# Patient Record
Sex: Male | Born: 1940 | Race: White | Hispanic: No | Marital: Married | Smoking: Current every day smoker
Health system: Southern US, Community
[De-identification: ages and names within clinical notes are randomized; demographics above are authoritative.]

## PROBLEM LIST (undated history)

## (undated) DIAGNOSIS — R262 Difficulty in walking, not elsewhere classified: Secondary | ICD-10-CM

## (undated) DIAGNOSIS — R634 Abnormal weight loss: Secondary | ICD-10-CM

## (undated) DIAGNOSIS — Z86010 Personal history of colonic polyps: Secondary | ICD-10-CM

## (undated) DIAGNOSIS — I509 Heart failure, unspecified: Secondary | ICD-10-CM

## (undated) DIAGNOSIS — K219 Gastro-esophageal reflux disease without esophagitis: Secondary | ICD-10-CM

## (undated) DIAGNOSIS — I10 Essential (primary) hypertension: Secondary | ICD-10-CM

## (undated) DIAGNOSIS — I350 Nonrheumatic aortic (valve) stenosis: Secondary | ICD-10-CM

## (undated) DIAGNOSIS — E785 Hyperlipidemia, unspecified: Secondary | ICD-10-CM

## (undated) DIAGNOSIS — E119 Type 2 diabetes mellitus without complications: Secondary | ICD-10-CM

## (undated) DIAGNOSIS — R000 Tachycardia, unspecified: Secondary | ICD-10-CM

## (undated) DIAGNOSIS — E8770 Fluid overload, unspecified: Secondary | ICD-10-CM

## (undated) DIAGNOSIS — C3491 Malignant neoplasm of unspecified part of right bronchus or lung: Secondary | ICD-10-CM

## (undated) DIAGNOSIS — J189 Pneumonia, unspecified organism: Secondary | ICD-10-CM

## (undated) DIAGNOSIS — R1031 Right lower quadrant pain: Secondary | ICD-10-CM

## (undated) DIAGNOSIS — Z720 Tobacco use: Secondary | ICD-10-CM

## (undated) HISTORY — DX: Heart failure, unspecified: I50.9

## (undated) HISTORY — PX: DOPPLER ECHOCARDIOGRAPHY: SHX263

## (undated) HISTORY — DX: Type 2 diabetes mellitus without complications: E11.9

## (undated) HISTORY — DX: Difficulty in walking, not elsewhere classified: R26.2

## (undated) HISTORY — DX: Fluid overload, unspecified: E87.70

## (undated) HISTORY — DX: Abnormal weight loss: R63.4

## (undated) HISTORY — DX: Right lower quadrant pain: R10.31

## (undated) HISTORY — DX: Essential (primary) hypertension: I10

## (undated) HISTORY — DX: Tobacco use: Z72.0

## (undated) HISTORY — DX: Tachycardia, unspecified: R00.0

## (undated) HISTORY — DX: Pneumonia, unspecified organism: J18.9

## (undated) HISTORY — DX: Gastro-esophageal reflux disease without esophagitis: K21.9

## (undated) HISTORY — DX: Personal history of colonic polyps: Z86.010

---

## 2008-08-08 ENCOUNTER — Encounter: Admission: RE | Admit: 2008-08-08 | Discharge: 2008-08-08 | Payer: Self-pay | Admitting: Family Medicine

## 2008-08-08 ENCOUNTER — Encounter: Payer: Self-pay | Admitting: Internal Medicine

## 2008-09-22 DIAGNOSIS — E119 Type 2 diabetes mellitus without complications: Secondary | ICD-10-CM

## 2008-09-29 ENCOUNTER — Ambulatory Visit: Payer: Self-pay | Admitting: Internal Medicine

## 2008-09-29 DIAGNOSIS — R1031 Right lower quadrant pain: Secondary | ICD-10-CM

## 2008-09-29 DIAGNOSIS — R634 Abnormal weight loss: Secondary | ICD-10-CM

## 2008-09-29 HISTORY — DX: Right lower quadrant pain: R10.31

## 2008-09-29 HISTORY — DX: Abnormal weight loss: R63.4

## 2008-10-16 ENCOUNTER — Telehealth: Payer: Self-pay | Admitting: Internal Medicine

## 2008-10-20 ENCOUNTER — Telehealth: Payer: Self-pay | Admitting: Internal Medicine

## 2008-10-20 ENCOUNTER — Ambulatory Visit: Payer: Self-pay | Admitting: Internal Medicine

## 2008-10-20 ENCOUNTER — Encounter: Payer: Self-pay | Admitting: Internal Medicine

## 2008-10-20 LAB — CONVERTED CEMR LAB: UREASE: NEGATIVE

## 2008-10-22 ENCOUNTER — Encounter: Payer: Self-pay | Admitting: Internal Medicine

## 2008-11-20 ENCOUNTER — Ambulatory Visit (HOSPITAL_COMMUNITY): Admission: RE | Admit: 2008-11-20 | Discharge: 2008-11-20 | Payer: Self-pay | Admitting: Family Medicine

## 2008-12-09 ENCOUNTER — Ambulatory Visit: Payer: Self-pay | Admitting: Oncology

## 2008-12-18 LAB — CBC WITH DIFFERENTIAL/PLATELET
BASO%: 0.4 % (ref 0.0–2.0)
EOS%: 0.7 % (ref 0.0–7.0)
LYMPH%: 20.2 % (ref 14.0–49.0)
MCH: 32.7 pg (ref 27.2–33.4)
MCHC: 34.7 g/dL (ref 32.0–36.0)
MONO#: 1.1 10*3/uL — ABNORMAL HIGH (ref 0.1–0.9)
MONO%: 9.4 % (ref 0.0–14.0)
Platelets: 306 10*3/uL (ref 140–400)
RBC: 5.18 10*6/uL (ref 4.20–5.82)
WBC: 11.2 10*3/uL — ABNORMAL HIGH (ref 4.0–10.3)

## 2008-12-18 LAB — MORPHOLOGY
PLT EST: ADEQUATE
RBC Comments: NORMAL

## 2008-12-23 ENCOUNTER — Encounter: Payer: Self-pay | Admitting: Oncology

## 2008-12-23 ENCOUNTER — Other Ambulatory Visit: Admission: RE | Admit: 2008-12-23 | Discharge: 2008-12-23 | Payer: Self-pay | Admitting: Oncology

## 2008-12-23 LAB — CBC WITH DIFFERENTIAL/PLATELET
BASO%: 1.2 % (ref 0.0–2.0)
Eosinophils Absolute: 0.1 10*3/uL (ref 0.0–0.5)
MCHC: 34.8 g/dL (ref 32.0–36.0)
MONO#: 0.4 10*3/uL (ref 0.1–0.9)
MONO%: 4.4 % (ref 0.0–14.0)
NEUT#: 6.5 10*3/uL (ref 1.5–6.5)
RBC: 4.86 10*6/uL (ref 4.20–5.82)
RDW: 12.9 % (ref 11.0–14.6)
WBC: 8.7 10*3/uL (ref 4.0–10.3)

## 2008-12-23 LAB — CHCC SMEAR

## 2008-12-24 LAB — COMPREHENSIVE METABOLIC PANEL
Albumin: 4.3 g/dL (ref 3.5–5.2)
BUN: 8 mg/dL (ref 6–23)
CO2: 29 mEq/L (ref 19–32)
Calcium: 9.9 mg/dL (ref 8.4–10.5)
Chloride: 99 mEq/L (ref 96–112)
Glucose, Bld: 155 mg/dL — ABNORMAL HIGH (ref 70–99)
Potassium: 4.2 mEq/L (ref 3.5–5.3)
Sodium: 138 mEq/L (ref 135–145)
Total Protein: 6.9 g/dL (ref 6.0–8.3)

## 2008-12-25 ENCOUNTER — Encounter: Admission: RE | Admit: 2008-12-25 | Discharge: 2008-12-25 | Payer: Self-pay | Admitting: Oncology

## 2008-12-25 ENCOUNTER — Encounter (INDEPENDENT_AMBULATORY_CARE_PROVIDER_SITE_OTHER): Payer: Self-pay | Admitting: *Deleted

## 2008-12-25 LAB — SPEP & IFE WITH QIG
Alpha-2-Globulin: 13.1 % — ABNORMAL HIGH (ref 7.1–11.8)
IgG (Immunoglobin G), Serum: 630 mg/dL — ABNORMAL LOW (ref 694–1618)
Total Protein, Serum Electrophoresis: 6.8 g/dL (ref 6.0–8.3)

## 2008-12-25 LAB — KAPPA/LAMBDA LIGHT CHAINS: Kappa free light chain: <0.6 mg/dL (ref 0.33–1.94)

## 2009-02-12 ENCOUNTER — Encounter: Payer: Self-pay | Admitting: *Deleted

## 2009-04-01 ENCOUNTER — Ambulatory Visit: Payer: Self-pay | Admitting: Oncology

## 2009-06-04 ENCOUNTER — Ambulatory Visit: Payer: Self-pay | Admitting: Internal Medicine

## 2009-06-04 DIAGNOSIS — K219 Gastro-esophageal reflux disease without esophagitis: Secondary | ICD-10-CM

## 2009-06-04 DIAGNOSIS — Z860100 Personal history of colon polyps, unspecified: Secondary | ICD-10-CM | POA: Insufficient documentation

## 2009-06-04 HISTORY — DX: Gastro-esophageal reflux disease without esophagitis: K21.9

## 2009-06-04 HISTORY — DX: Personal history of colonic polyps: Z86.010

## 2010-10-24 ENCOUNTER — Encounter: Payer: Self-pay | Admitting: Family Medicine

## 2010-10-25 ENCOUNTER — Encounter: Payer: Self-pay | Admitting: Oncology

## 2010-10-25 ENCOUNTER — Encounter: Payer: Self-pay | Admitting: Internal Medicine

## 2011-01-13 LAB — BONE MARROW EXAM

## 2011-07-13 ENCOUNTER — Other Ambulatory Visit: Payer: Self-pay | Admitting: Family Medicine

## 2011-10-17 ENCOUNTER — Encounter: Payer: Self-pay | Admitting: Internal Medicine

## 2012-03-03 ENCOUNTER — Encounter: Payer: Self-pay | Admitting: *Deleted

## 2012-07-13 ENCOUNTER — Encounter: Payer: Self-pay | Admitting: Internal Medicine

## 2014-05-24 ENCOUNTER — Encounter: Payer: Self-pay | Admitting: Internal Medicine

## 2015-11-10 DIAGNOSIS — F17200 Nicotine dependence, unspecified, uncomplicated: Secondary | ICD-10-CM | POA: Insufficient documentation

## 2015-11-10 DIAGNOSIS — G8929 Other chronic pain: Secondary | ICD-10-CM | POA: Insufficient documentation

## 2015-11-10 DIAGNOSIS — I10 Essential (primary) hypertension: Secondary | ICD-10-CM | POA: Insufficient documentation

## 2016-08-03 HISTORY — PX: CATARACT EXTRACTION, BILATERAL: SHX1313

## 2018-09-12 ENCOUNTER — Other Ambulatory Visit: Payer: Self-pay | Admitting: Physician Assistant

## 2018-09-12 ENCOUNTER — Ambulatory Visit
Admission: RE | Admit: 2018-09-12 | Discharge: 2018-09-12 | Disposition: A | Discharge status: Home / Self Care | Payer: Medicare Other | Source: Ambulatory Visit | Attending: Physician Assistant | Admitting: Physician Assistant

## 2018-09-12 DIAGNOSIS — R0602 Shortness of breath: Secondary | ICD-10-CM

## 2018-09-13 ENCOUNTER — Other Ambulatory Visit: Payer: Self-pay

## 2018-09-13 ENCOUNTER — Encounter (HOSPITAL_COMMUNITY): Payer: Self-pay | Admitting: *Deleted

## 2018-09-13 ENCOUNTER — Inpatient Hospital Stay (HOSPITAL_COMMUNITY)
Admission: EM | Admit: 2018-09-13 | Discharge: 2018-09-15 | DRG: 291 | Disposition: A | Discharge status: Home / Self Care | Payer: Medicare Other | Attending: Internal Medicine | Admitting: Internal Medicine

## 2018-09-13 ENCOUNTER — Emergency Department (HOSPITAL_COMMUNITY): Payer: Medicare Other

## 2018-09-13 DIAGNOSIS — K219 Gastro-esophageal reflux disease without esophagitis: Secondary | ICD-10-CM | POA: Diagnosis present

## 2018-09-13 DIAGNOSIS — I5023 Acute on chronic systolic (congestive) heart failure: Secondary | ICD-10-CM | POA: Diagnosis present

## 2018-09-13 DIAGNOSIS — F17210 Nicotine dependence, cigarettes, uncomplicated: Secondary | ICD-10-CM | POA: Diagnosis present

## 2018-09-13 DIAGNOSIS — E8770 Fluid overload, unspecified: Secondary | ICD-10-CM | POA: Diagnosis present

## 2018-09-13 DIAGNOSIS — Z720 Tobacco use: Secondary | ICD-10-CM | POA: Diagnosis present

## 2018-09-13 DIAGNOSIS — E785 Hyperlipidemia, unspecified: Secondary | ICD-10-CM | POA: Diagnosis present

## 2018-09-13 DIAGNOSIS — I110 Hypertensive heart disease with heart failure: Secondary | ICD-10-CM | POA: Diagnosis present

## 2018-09-13 DIAGNOSIS — E119 Type 2 diabetes mellitus without complications: Secondary | ICD-10-CM

## 2018-09-13 DIAGNOSIS — Z7982 Long term (current) use of aspirin: Secondary | ICD-10-CM

## 2018-09-13 DIAGNOSIS — J189 Pneumonia, unspecified organism: Secondary | ICD-10-CM | POA: Diagnosis present

## 2018-09-13 DIAGNOSIS — E1165 Type 2 diabetes mellitus with hyperglycemia: Secondary | ICD-10-CM | POA: Diagnosis present

## 2018-09-13 DIAGNOSIS — I509 Heart failure, unspecified: Secondary | ICD-10-CM

## 2018-09-13 DIAGNOSIS — I340 Nonrheumatic mitral (valve) insufficiency: Secondary | ICD-10-CM | POA: Diagnosis not present

## 2018-09-13 DIAGNOSIS — I350 Nonrheumatic aortic (valve) stenosis: Secondary | ICD-10-CM | POA: Diagnosis present

## 2018-09-13 DIAGNOSIS — Z7984 Long term (current) use of oral hypoglycemic drugs: Secondary | ICD-10-CM | POA: Diagnosis not present

## 2018-09-13 DIAGNOSIS — Z79899 Other long term (current) drug therapy: Secondary | ICD-10-CM | POA: Diagnosis not present

## 2018-09-13 HISTORY — DX: Pneumonia, unspecified organism: J18.9

## 2018-09-13 HISTORY — DX: Fluid overload, unspecified: E87.70

## 2018-09-13 HISTORY — DX: Nonrheumatic aortic (valve) stenosis: I35.0

## 2018-09-13 HISTORY — DX: Tobacco use: Z72.0

## 2018-09-13 HISTORY — DX: Hyperlipidemia, unspecified: E78.5

## 2018-09-13 LAB — MAGNESIUM: Magnesium: 1.7 mg/dL (ref 1.7–2.4)

## 2018-09-13 LAB — CBC WITH DIFFERENTIAL/PLATELET
Abs Immature Granulocytes: 0.04 10*3/uL (ref 0.00–0.07)
BASOS ABS: 0.1 10*3/uL (ref 0.0–0.1)
Basophils Relative: 1 %
Eosinophils Absolute: 0.1 10*3/uL (ref 0.0–0.5)
Eosinophils Relative: 1 %
HCT: 46.7 % (ref 39.0–52.0)
Hemoglobin: 15 g/dL (ref 13.0–17.0)
Immature Granulocytes: 1 %
Lymphocytes Relative: 11 %
Lymphs Abs: 0.9 10*3/uL (ref 0.7–4.0)
MCH: 30.5 pg (ref 26.0–34.0)
MCHC: 32.1 g/dL (ref 30.0–36.0)
MCV: 95.1 fL (ref 80.0–100.0)
MONOS PCT: 8 %
Monocytes Absolute: 0.7 10*3/uL (ref 0.1–1.0)
Neutro Abs: 6.7 10*3/uL (ref 1.7–7.7)
Neutrophils Relative %: 78 %
Platelets: 278 10*3/uL (ref 150–400)
RBC: 4.91 MIL/uL (ref 4.22–5.81)
RDW: 13.6 % (ref 11.5–15.5)
WBC: 8.6 10*3/uL (ref 4.0–10.5)
nRBC: 0 % (ref 0.0–0.2)

## 2018-09-13 LAB — BASIC METABOLIC PANEL
Anion gap: 16 — ABNORMAL HIGH (ref 5–15)
BUN: 9 mg/dL (ref 8–23)
CO2: 23 mmol/L (ref 22–32)
Calcium: 9.1 mg/dL (ref 8.9–10.3)
Chloride: 98 mmol/L (ref 98–111)
Creatinine, Ser: 0.96 mg/dL (ref 0.61–1.24)
GFR calc Af Amer: >60 mL/min (ref >60)
GFR calc non Af Amer: >60 mL/min (ref >60)
GLUCOSE: 201 mg/dL — AB (ref 70–99)
Potassium: 4.1 mmol/L (ref 3.5–5.1)
Sodium: 137 mmol/L (ref 135–145)

## 2018-09-13 LAB — PROTIME-INR
INR: 1.06
Prothrombin Time: 13.7 seconds (ref 11.4–15.2)

## 2018-09-13 LAB — GLUCOSE, CAPILLARY: Glucose-Capillary: 145 mg/dL — ABNORMAL HIGH (ref 70–99)

## 2018-09-13 LAB — I-STAT TROPONIN, ED: Troponin i, poc: 0.03 ng/mL (ref 0.00–0.08)

## 2018-09-13 LAB — BRAIN NATRIURETIC PEPTIDE: B NATRIURETIC PEPTIDE 5: 927.4 pg/mL — AB (ref 0.0–100.0)

## 2018-09-13 LAB — PHOSPHORUS: Phosphorus: 3.7 mg/dL (ref 2.5–4.6)

## 2018-09-13 LAB — I-STAT CG4 LACTIC ACID, ED
Lactic Acid, Venous: 1.76 mmol/L (ref 0.5–1.9)
Lactic Acid, Venous: 1.42 mmol/L (ref 0.5–1.9)

## 2018-09-13 MED ORDER — INSULIN ASPART 100 UNIT/ML ~~LOC~~ SOLN
0.0000 [IU] | Freq: Three times a day (TID) | SUBCUTANEOUS | Status: DC
  Administered 2018-09-14: 1 [IU] via SUBCUTANEOUS
  Administered 2018-09-14: 2 [IU] via SUBCUTANEOUS
  Administered 2018-09-15: 1 [IU] via SUBCUTANEOUS

## 2018-09-13 MED ORDER — AZITHROMYCIN 500 MG PO TABS
500.0000 mg | ORAL_TABLET | ORAL | Status: DC
  Administered 2018-09-14: 500 mg via ORAL
  Filled 2018-09-13: qty 1

## 2018-09-13 MED ORDER — SODIUM CHLORIDE 0.9 % IV SOLN
500.0000 mg | Freq: Once | INTRAVENOUS | Status: AC
  Administered 2018-09-13: 500 mg via INTRAVENOUS
  Filled 2018-09-13: qty 500

## 2018-09-13 MED ORDER — ALBUTEROL SULFATE (2.5 MG/3ML) 0.083% IN NEBU: 2.5000 mg | INHALATION_SOLUTION | RESPIRATORY_TRACT | Status: DC | PRN

## 2018-09-13 MED ORDER — FUROSEMIDE 10 MG/ML IJ SOLN
40.0000 mg | Freq: Once | INTRAMUSCULAR | Status: AC
  Administered 2018-09-13: 40 mg via INTRAVENOUS
  Filled 2018-09-13: qty 4

## 2018-09-13 MED ORDER — NICOTINE 21 MG/24HR TD PT24
21.0000 mg | MEDICATED_PATCH | TRANSDERMAL | Status: DC
  Administered 2018-09-13: 21 mg via TRANSDERMAL
  Filled 2018-09-13 (×2): qty 1

## 2018-09-13 MED ORDER — PRAVASTATIN SODIUM 40 MG PO TABS
40.0000 mg | ORAL_TABLET | Freq: Every day | ORAL | Status: DC
  Administered 2018-09-14 – 2018-09-15 (×2): 40 mg via ORAL
  Filled 2018-09-13 (×2): qty 1

## 2018-09-13 MED ORDER — PROCHLORPERAZINE EDISYLATE 10 MG/2ML IJ SOLN
5.0000 mg | INTRAMUSCULAR | Status: DC | PRN
  Filled 2018-09-13: qty 1

## 2018-09-13 MED ORDER — SODIUM CHLORIDE 0.9 % IV SOLN
1.0000 g | INTRAVENOUS | Status: DC
  Administered 2018-09-14: 1 g via INTRAVENOUS
  Filled 2018-09-13: qty 10

## 2018-09-13 MED ORDER — MAGNESIUM SULFATE 2 GM/50ML IV SOLN
2.0000 g | Freq: Once | INTRAVENOUS | Status: AC
  Administered 2018-09-13: 2 g via INTRAVENOUS
  Filled 2018-09-13: qty 50

## 2018-09-13 MED ORDER — HEPARIN SODIUM (PORCINE) 5000 UNIT/ML IJ SOLN
5000.0000 [IU] | Freq: Three times a day (TID) | INTRAMUSCULAR | Status: DC
  Administered 2018-09-13 – 2018-09-15 (×5): 5000 [IU] via SUBCUTANEOUS
  Filled 2018-09-13 (×6): qty 1

## 2018-09-13 MED ORDER — IPRATROPIUM-ALBUTEROL 0.5-2.5 (3) MG/3ML IN SOLN
3.0000 mL | Freq: Once | RESPIRATORY_TRACT | Status: AC
  Administered 2018-09-13: 3 mL via RESPIRATORY_TRACT
  Filled 2018-09-13: qty 3

## 2018-09-13 MED ORDER — IPRATROPIUM BROMIDE 0.02 % IN SOLN
0.5000 mg | Freq: Four times a day (QID) | RESPIRATORY_TRACT | Status: DC
  Administered 2018-09-13: 0.5 mg via RESPIRATORY_TRACT
  Filled 2018-09-13 (×2): qty 2.5

## 2018-09-13 MED ORDER — ACETAMINOPHEN 325 MG PO TABS
650.0000 mg | ORAL_TABLET | Freq: Four times a day (QID) | ORAL | Status: DC | PRN
  Administered 2018-09-14: 650 mg via ORAL
  Filled 2018-09-13: qty 2

## 2018-09-13 MED ORDER — ASPIRIN EC 81 MG PO TBEC
81.0000 mg | DELAYED_RELEASE_TABLET | Freq: Every day | ORAL | Status: DC
  Administered 2018-09-14 – 2018-09-15 (×2): 81 mg via ORAL
  Filled 2018-09-13 (×2): qty 1

## 2018-09-13 MED ORDER — LISINOPRIL 5 MG PO TABS
2.5000 mg | ORAL_TABLET | Freq: Every day | ORAL | Status: DC
  Administered 2018-09-13 – 2018-09-15 (×3): 2.5 mg via ORAL
  Filled 2018-09-13 (×3): qty 1

## 2018-09-13 MED ORDER — SODIUM CHLORIDE 0.9 % IV SOLN
1.0000 g | Freq: Once | INTRAVENOUS | Status: AC
  Administered 2018-09-13: 1 g via INTRAVENOUS
  Filled 2018-09-13: qty 10

## 2018-09-13 MED ORDER — POTASSIUM CHLORIDE CRYS ER 20 MEQ PO TBCR
20.0000 meq | EXTENDED_RELEASE_TABLET | Freq: Two times a day (BID) | ORAL | Status: DC
  Administered 2018-09-13 – 2018-09-15 (×4): 20 meq via ORAL
  Filled 2018-09-13 (×5): qty 1

## 2018-09-13 MED ORDER — ONDANSETRON HCL 4 MG/2ML IJ SOLN: 4.0000 mg | Freq: Four times a day (QID) | INTRAMUSCULAR | Status: DC | PRN

## 2018-09-13 MED ORDER — ONDANSETRON HCL 4 MG PO TABS: 4.0000 mg | ORAL_TABLET | Freq: Four times a day (QID) | ORAL | Status: DC | PRN

## 2018-09-13 MED ORDER — ACETAMINOPHEN 650 MG RE SUPP: 650.0000 mg | Freq: Four times a day (QID) | RECTAL | Status: DC | PRN

## 2018-09-13 MED ORDER — FUROSEMIDE 10 MG/ML IJ SOLN
40.0000 mg | Freq: Every day | INTRAMUSCULAR | Status: DC
  Administered 2018-09-14 – 2018-09-15 (×2): 40 mg via INTRAVENOUS
  Filled 2018-09-13 (×2): qty 4

## 2018-09-13 MED ORDER — PANTOPRAZOLE SODIUM 40 MG PO TBEC
40.0000 mg | DELAYED_RELEASE_TABLET | Freq: Every day | ORAL | Status: DC
  Administered 2018-09-13 – 2018-09-15 (×3): 40 mg via ORAL
  Filled 2018-09-13 (×3): qty 1

## 2018-09-13 MED ORDER — GLIMEPIRIDE 4 MG PO TABS
4.0000 mg | ORAL_TABLET | Freq: Every day | ORAL | Status: DC
  Administered 2018-09-14: 4 mg via ORAL
  Filled 2018-09-13 (×2): qty 1

## 2018-09-13 MED ORDER — METOPROLOL TARTRATE 25 MG PO TABS
25.0000 mg | ORAL_TABLET | Freq: Two times a day (BID) | ORAL | Status: DC
  Administered 2018-09-13 – 2018-09-15 (×4): 25 mg via ORAL
  Filled 2018-09-13 (×4): qty 1

## 2018-09-13 MED ORDER — ALBUTEROL SULFATE (2.5 MG/3ML) 0.083% IN NEBU
2.5000 mg | INHALATION_SOLUTION | Freq: Four times a day (QID) | RESPIRATORY_TRACT | Status: DC
  Administered 2018-09-13: 2.5 mg via RESPIRATORY_TRACT
  Filled 2018-09-13 (×2): qty 3

## 2018-09-13 MED ORDER — METFORMIN HCL 500 MG PO TABS
1000.0000 mg | ORAL_TABLET | Freq: Two times a day (BID) | ORAL | Status: DC
  Administered 2018-09-14 – 2018-09-15 (×3): 1000 mg via ORAL
  Filled 2018-09-13 (×3): qty 2

## 2018-09-13 NOTE — ED Provider Notes (Signed)
[place] [facility] EMERGENCY DEPARTMENT Provider Note   CSN: [id number] Arrival date & time: 09/13/18  1108     History   Chief Complaint Chief Complaint  Patient presents with  . CHF/Possible pneumonia    HPI Geoffrey Benjamin is a 77 y.o. male.  HPI Patient reports he has been becoming increasingly short of breath for about 3 weeks.  This has been worsening over the past week.  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  He had the rest frequently to catch his breath.  Patient denies he was having chest pain in association with this.  He reports as well at night he is having trouble sleeping because when he lies flat he feels very short of breath.  Again, no chest pain associated.  No fever and no cough.  Patient reports that he saw his doctor yesterday and they did some labs and x-rays.  He reports they called him and were very concerned that he might have pneumonia or fluid on the lungs and advised him to come to the emergency department.  Patient denies he is having any lower extremity swelling or calf pain.  He does smoke heavily still.  He denies he knows of any diagnosis of COPD. Past Medical History:  Diagnosis Date  . DM (diabetes mellitus) ([place])   . Tachycardia     Patient Active Problem List   Diagnosis Date Noted  . GERD 06/04/2009  . PERSONAL HX COLONIC POLYPS 06/04/2009  . LOSS OF WEIGHT 09/29/2008  . RLQ PAIN 09/29/2008  . DM 09/22/2008    Past Surgical History:  Procedure Laterality Date  . DOPPLER ECHOCARDIOGRAPHY     Mitral inflow and tissue doppler consistent with impaired LV relaxation; Trileaflet aortic vlave with moderate aortic valve stenosis, Trivial mitral and tricuspid valve regurgitation.        Home Medications    Prior to Admission medications   Medication Sig Start Date End Date Taking? Authorizing Provider  aspirin EC 81 MG tablet Take 81 mg by mouth daily.   Yes [provider]  glimepiride  (AMARYL) 4 MG tablet Take 4 mg by mouth daily at 12 noon. 05/18/18  Yes [provider]  metFORMIN (GLUCOPHAGE) 1000 MG tablet Take 1,000 mg by mouth 2 (two) times daily with a meal. 09/07/18  Yes [provider]  metoprolol tartrate (LOPRESSOR) 25 MG tablet TAKE ONE TABLET BY MOUTH TWICE DAILY Patient taking differently: Take 25 mg by mouth 2 (two) times daily.  07/13/11  Yes Burchette, Alinda Sierras, MD  Omega-3 Fatty Acids (FISH OIL) 1200 MG CAPS Take 1,200 mg by mouth daily.   Yes [provider]  pravastatin (PRAVACHOL) 40 MG tablet Take 40 mg by mouth daily. 09/07/18  Yes [provider]    Family History Family History  Problem Relation Age of Onset  . Kidney disease Father   . Stroke Mother     Social History Social History   Tobacco Use  . Smoking status: Smoker, Current Status Unknown    Types: Cigarettes  . Smokeless tobacco: Never Used  Substance Use Topics  . Alcohol use: Yes  . Drug use: Not on file     Allergies   Patient has no known allergies.   Review of Systems Review of Systems 10 Systems reviewed and are negative for acute change except as noted in the HPI.  Physical Exam Updated Vital Signs BP 127/75   Pulse 79  Temp (!) 97.4 F (36.3 C) (Oral)   Resp (!) 22   Ht 6' (1.829 m)   Wt 82.6 kg   SpO2 94%   BMI 24.68 kg/m   Physical Exam Constitutional:      Comments: Patient is alert and nontoxic.  No respiratory distress at rest.  Normal mental status.  HENT:     Head: Normocephalic and atraumatic.     Mouth/Throat:     Mouth: Mucous membranes are moist.     Pharynx: Oropharynx is clear.  Eyes:     Extraocular Movements: Extraocular movements intact.  Cardiovascular:     Rate and Rhythm: Normal rate.     Comments: 3+ systolic ejection murmur. Pulmonary:     Comments: No respiratory distress at rest.  Lungs are grossly clear.  Slightly diminished at the bases. Musculoskeletal: Normal range of motion.         General: No swelling, tenderness, deformity or signs of injury.  Skin:    General: Skin is warm and dry.     Capillary Refill: Capillary refill takes less than 2 seconds.  Neurological:     General: No focal deficit present.     Mental Status: He is oriented to person, place, and time.     Cranial Nerves: No cranial nerve deficit.  Psychiatric:        Mood and Affect: Mood normal.      ED Treatments / Results  Labs (all labs ordered are listed, but only abnormal results are displayed) Labs Reviewed  BASIC METABOLIC PANEL - Abnormal; Notable for the following components:      Result Value   Glucose, Bld 201 (*)    Anion gap 16 (*)    All other components within normal limits  BRAIN NATRIURETIC PEPTIDE - Abnormal; Notable for the following components:   B Natriuretic Peptide 927.4 (*)    All other components within normal limits  CBC WITH DIFFERENTIAL/PLATELET  PROTIME-INR  URINALYSIS, ROUTINE W REFLEX MICROSCOPIC  I-STAT TROPONIN, ED  I-STAT CG4 LACTIC ACID, ED  I-STAT CG4 LACTIC ACID, ED    EKG EKG Interpretation  Date/Time:  Thursday September 13 2018 11:14:51 EST Ventricular Rate:  91 PR Interval:    QRS Duration: 115 QT Interval:  392 QTC Calculation: 483 R Axis:   -65 Text Interpretation:  Sinus rhythm Incomplete right bundle branch block LVH with IVCD, LAD and secondary repol abnrm Borderline prolonged QT interval Baseline wander in lead(s) V1 V4 agree. no STEMI no old comparison Confirmed by Charlesetta Shanks [phone]) on 09/13/2018 1:04:27 PM   Radiology Dg Chest 2 View  Result Date: 09/13/2018 CLINICAL DATA:  Shortness of Breath EXAM: CHEST - 2 VIEW COMPARISON:  September 12, 2018 chest radiograph and chest CT December 25, 2008 FINDINGS: There is persistent cardiomegaly with pulmonary venous hypertension. There is interstitial edema throughout the lungs bilaterally. There are areas of patchy airspace opacity in the right upper and left lower lobe regions. There is a  small right pleural effusion. There is aortic atherosclerosis. No adenopathy. There is degenerative change in the lower thoracic spine. IMPRESSION: Pulmonary vascular congestion with interstitial edema and small right pleural effusion. Suspect a degree of congestive heart failure. Patchy airspace opacity in the right upper lobe and left base regions may represent alveolar edema or patchy multifocal pneumonia. Suspect pneumonia superimposed on a degree of congestive heart failure. There is aortic atherosclerosis. No evident adenopathy. Aortic Atherosclerosis (ICD10-I70.0). Electronically Signed   By: Lowella Grip III M.D.  On: 09/13/2018 11:55   Dg Chest 2 View  Result Date: 09/12/2018 CLINICAL DATA:  Worsening shortness of breath over 2 weeks EXAM: CHEST - 2 VIEW COMPARISON:  11/20/2008 FINDINGS: Cardiac shadow is enlarged. Aortic calcifications are noted. Mild increased central vascular congestion is noted. Some patchy opacities are noted within both lungs which may represent acute infiltrate although there chronicity could not be determined. No effusion is seen. No bony abnormality is noted. IMPRESSION: Mild vascular congestion. Patchy opacities in both lungs which given a lack of prior imaging must be considered as acute multifocal pneumonia. Follow-up following appropriate therapy is recommended in 3-4 weeks. Electronically Signed   By: Inez Catalina M.D.   On: 09/12/2018 16:26    Procedures Procedures (including critical care time)  Medications Ordered in ED Medications  cefTRIAXone (ROCEPHIN) 1 g in sodium chloride 0.9 % 100 mL IVPB (has no administration in time range)  azithromycin (ZITHROMAX) 500 mg in sodium chloride 0.9 % 250 mL IVPB (has no administration in time range)  furosemide (LASIX) injection 40 mg (has no administration in time range)  ipratropium-albuterol (DUONEB) 0.5-2.5 (3) MG/3ML nebulizer solution 3 mL (has no administration in time range)     Initial Impression /  Assessment and Plan / ED Course  I have reviewed the triage vital signs and the nursing notes.  Pertinent labs & imaging results that were available during my care of the patient were reviewed by me and considered in my medical decision making (see chart for details).    Patient presents with increasing shortness of breath.  This is been worsening over the past week.  This appears to be combined issues of congestive heart failure with pneumonia.  Patient also has heavy smoking history and COPD may be part of the picture as well.  Patient is oxygenating at rest.  He is getting very short of breath with any exertion.  He does not have active chest pain.  Patient's mental status is clear.  Plan will be for admission for treatment of the CHF with concomitant pneumonia.  Patient does have long smoking history, may also need further evaluation for possible lung cancer.  Final Clinical Impressions(s) / ED Diagnoses   Final diagnoses:  Community acquired pneumonia, unspecified laterality  Acute congestive heart failure, unspecified heart failure type [facility])    ED Discharge Orders    None       Charlesetta Shanks, MD 09/13/18 1306

## 2018-09-13 NOTE — H&P (Signed)
History and Physical    Geoffrey Benjamin NUU:[id number] DOB: 30-Jun-1941 DOA: 09/13/2018  PCP: Geoffrey Bene, PA-C   Patient coming from: Home.  I have personally briefly reviewed patient's old medical records in [place]  Chief Complaint: Shortness of breath.  HPI: Geoffrey Benjamin is a 77 y.o. male with medical history significant of type 2 diabetes, hyperlipidemia, history of unspecified tachycardia, aortic stenosis who was coming to the emergency department with complaints of a progressively worse dyspnea for the past 4 to 5 weeks associated with dry cough and fatigue.  He denies fever, chills, sore throat, but stated he has been having rhinorrhea.  He denies hemoptysis, but complains of occasional wheezing.  No chest pain, palpitations, dizziness, diaphoresis, PND or pitting edema of the lower extremities.  However he has been having orthopnea.  Denies abdominal pain, nausea, emesis, diarrhea, constipation, melena or hematochezia.  No dysuria, frequency, hematuria, but complains of nocturia 3 times a night.  Denies heat or cold intolerance, polyuria, polydipsia, polyphagia or blurred vision.  Denies skin pruritus or rashes.  ED Course: Initial vital signs temperature 97.4 F, pulse 91, respirations 13, blood pressure 142/86 mmHg and O2 sat 96% on room air.  The patient received supplemental oxygen, ceftriaxone and azithromycin in the emergency department.    His CBC was normal.  Lactic acid x2 was normal.  Troponin within normal limits.  BNP was 927.4 pg milliliter.  Chemistries shows normal electrolytes, normal renal function, but hyperglycemia with a glucose of 201 mg/dL.  Chest radiograph shows vascular congestion and patchy opacities in the right upper lobe and left base, concerning for multifocal pneumonia.  Please see images and full radiology report for further detail.  Review of Systems: As per HPI otherwise 10 point review of systems negative.   Past Medical History:    Diagnosis Date  . DM (diabetes mellitus) ([place])   . Hyperlipidemia 09/13/2018  . Tachycardia     Past Surgical History:  Procedure Laterality Date  . DOPPLER ECHOCARDIOGRAPHY     Mitral inflow and tissue doppler consistent with impaired LV relaxation; Trileaflet aortic vlave with moderate aortic valve stenosis, Trivial mitral and tricuspid valve regurgitation.     reports that he has been smoking cigarettes. He has never used smokeless tobacco. He reports current alcohol use. No history on file for drug.  No Known Allergies  Family History  Problem Relation Age of Onset  . Kidney disease Father   . Stroke Mother    Prior to Admission medications   Medication Sig Start Date End Date Taking? Authorizing Provider  aspirin EC 81 MG tablet Take 81 mg by mouth daily.   Yes [provider]  glimepiride (AMARYL) 4 MG tablet Take 4 mg by mouth daily at 12 noon. 05/18/18  Yes [provider]  metFORMIN (GLUCOPHAGE) 1000 MG tablet Take 1,000 mg by mouth 2 (two) times daily with a meal. 09/07/18  Yes [provider]  metoprolol tartrate (LOPRESSOR) 25 MG tablet TAKE ONE TABLET BY MOUTH TWICE DAILY Patient taking differently: Take 25 mg by mouth 2 (two) times daily.  07/13/11  Yes Burchette, Alinda Sierras, MD  Omega-3 Fatty Acids (FISH OIL) 1200 MG CAPS Take 1,200 mg by mouth daily.   Yes [provider]  pravastatin (PRAVACHOL) 40 MG tablet Take 40 mg by mouth daily. 09/07/18  Yes [provider]    Physical Exam: Vitals:   09/13/18 1430 09/13/18 1445 09/13/18 1500 09/13/18 1515  BP:  132/89  114/76  Pulse: 96 95 90 95  Resp: (!) 24 (!) 21 18 (!) 26  Temp:      TempSrc:      SpO2: 94% 95% 94% 94%  Weight:      Height:        Constitutional: NAD, calm, comfortable Eyes: PERRL, lids and conjunctivae normal ENMT: Mucous membranes are moist. Posterior pharynx clear of any exudate or lesions. Neck: normal, supple, no masses, no  thyromegaly Respiratory: Bibasilar rales, mild wheezing, no crackles. Normal respiratory effort. No accessory muscle use.  Cardiovascular: Regular rate and rhythm, positive 3/6 SEM, no rubs / gallops. No extremity edema. 2+ pedal pulses. No carotid bruits.  Abdomen: no tenderness, no masses palpated. No hepatosplenomegaly. Bowel sounds positive.  Musculoskeletal: no clubbing / cyanosis. Good ROM, no contractures. Normal muscle tone.  Skin: Multiple areas of ecchymosis upper extremities.  Areas of hyperkeratosis on forearms. Neurologic: CN 2-12 grossly intact. Sensation intact, DTR normal. Strength 5/5 in all 4.  Psychiatric: Normal judgment and insight. Alert and oriented x 3. Normal mood.   Labs on Admission: I have personally reviewed following labs and imaging studies  CBC: Recent Labs  Lab 09/13/18 1131  WBC 8.6  NEUTROABS 6.7  HGB 15.0  HCT 46.7  MCV 95.1  PLT 211   Basic Metabolic Panel: Recent Labs  Lab 09/13/18 1131  NA 137  K 4.1  CL 98  CO2 23  GLUCOSE 201*  BUN 9  CREATININE 0.96  CALCIUM 9.1   GFR: Estimated Creatinine Clearance: 73 mL/min (by C-G formula based on SCr of 0.96 mg/dL). Liver Function Tests: No results for input(s): AST, ALT, ALKPHOS, BILITOT, PROT, ALBUMIN in the last 168 hours. No results for input(s): LIPASE, AMYLASE in the last 168 hours. No results for input(s): AMMONIA in the last 168 hours. Coagulation Profile: Recent Labs  Lab 09/13/18 1131  INR 1.06   Cardiac Enzymes: No results for input(s): CKTOTAL, CKMB, CKMBINDEX, TROPONINI in the last 168 hours. BNP (last 3 results) No results for input(s): PROBNP in the last 8760 hours. HbA1C: No results for input(s): HGBA1C in the last 72 hours. CBG: No results for input(s): GLUCAP in the last 168 hours. Lipid Profile: No results for input(s): CHOL, HDL, LDLCALC, TRIG, CHOLHDL, LDLDIRECT in the last 72 hours. Thyroid Function Tests: No results for input(s): TSH, T4TOTAL, FREET4,  T3FREE, THYROIDAB in the last 72 hours. Anemia Panel: No results for input(s): VITAMINB12, FOLATE, FERRITIN, TIBC, IRON, RETICCTPCT in the last 72 hours. Urine analysis: No results found for: COLORURINE, APPEARANCEUR, [place], [place], [place], [place], [place], [place], [place], [place], NITRITE, LEUKOCYTESUR  Radiological Exams on Admission: Dg Chest 2 View  Result Date: 09/13/2018 CLINICAL DATA:  Shortness of Breath EXAM: CHEST - 2 VIEW COMPARISON:  September 12, 2018 chest radiograph and chest CT December 25, 2008 FINDINGS: There is persistent cardiomegaly with pulmonary venous hypertension. There is interstitial edema throughout the lungs bilaterally. There are areas of patchy airspace opacity in the right upper and left lower lobe regions. There is a small right pleural effusion. There is aortic atherosclerosis. No adenopathy. There is degenerative change in the lower thoracic spine. IMPRESSION: Pulmonary vascular congestion with interstitial edema and small right pleural effusion. Suspect a degree of congestive heart failure. Patchy airspace opacity in the right upper lobe and left base regions may represent alveolar edema or patchy multifocal pneumonia. Suspect pneumonia superimposed on a degree of congestive heart failure. There is aortic atherosclerosis. No evident adenopathy. Aortic Atherosclerosis (ICD10-I70.0). Electronically Signed  By: Lowella Grip III M.D.   On: 09/13/2018 11:55   Dg Chest 2 View  Result Date: 09/12/2018 CLINICAL DATA:  Worsening shortness of breath over 2 weeks EXAM: CHEST - 2 VIEW COMPARISON:  11/20/2008 FINDINGS: Cardiac shadow is enlarged. Aortic calcifications are noted. Mild increased central vascular congestion is noted. Some patchy opacities are noted within both lungs which may represent acute infiltrate although there chronicity could not be determined. No effusion is seen. No bony abnormality is noted. IMPRESSION: Mild vascular congestion.  Patchy opacities in both lungs which given a lack of prior imaging must be considered as acute multifocal pneumonia. Follow-up following appropriate therapy is recommended in 3-4 weeks. Electronically Signed   By: Inez Catalina M.D.   On: 09/12/2018 16:26    EKG: Independently reviewed.  Vent. rate 91 BPM PR interval * ms QRS duration 115 ms QT/QTc 392/483 ms P-R-T axes 79 -65 99 Sinus rhythm Incomplete right bundle branch block LVH with IVCD, LAD and secondary repol abnrm Borderline prolonged QT interval Baseline wander in lead(s) V1 V4 agree.  No previous tracing to compare to.  Assessment/Plan Principal Problem:   CAP (community acquired pneumonia) Admit to telemetry/inpatient. Continue supplemental oxygen. Continue scheduled and as needed bronchodilators. Continue ceftriaxone 1 g IVPB every 24 hours. Continue azithromycin 500 mg p.o. daily. Check strep pneumoniae urinary antigen. Sputum Gram stain, culture and sensitivity.  Active Problems:   Volume overload Continue furosemide 40 mg IVP twice a day. Start lisinopril 2.5 mg p.o. daily. Continue Lopressor 25 mg p.o. twice daily. Monitor daily weights, intake and output. Monitor BP, HR, renal function and electrolytes.    Type 2 diabetes myelitis (HCC) Continue Amaryl 4 mg p.o. daily. Continue metformin 1000 mg p.o. twice daily. CBG monitoring with regular insulin sliding scale while in the hospital.    GERD Protonix 40 mg p.o. daily.    Hyperlipidemia Continue pravastatin 40 mg p.o. daily.    Tobacco use Nicotine replacement therapy ordered. Tobacco cessation information to be provided by the staff.    DVT prophylaxis: Heparin SQ. Code Status: Full code. Family Communication: His spouse was present in the ED room. Disposition Plan: Admit for pneumonia and volume overload treatment. Consults called:  Admission status: Inpatient/telemetry.   Reubin Milan MD [facility] Pager [phone]  If  7PM-7AM, please contact night-coverage [email or web address] Password [facility]  09/13/2018, 3:24 PM

## 2018-09-14 ENCOUNTER — Inpatient Hospital Stay (HOSPITAL_COMMUNITY): Payer: Medicare Other

## 2018-09-14 DIAGNOSIS — J189 Pneumonia, unspecified organism: Secondary | ICD-10-CM

## 2018-09-14 DIAGNOSIS — Z720 Tobacco use: Secondary | ICD-10-CM

## 2018-09-14 DIAGNOSIS — I340 Nonrheumatic mitral (valve) insufficiency: Secondary | ICD-10-CM

## 2018-09-14 LAB — URINALYSIS, ROUTINE W REFLEX MICROSCOPIC
Bilirubin Urine: NEGATIVE
Glucose, UA: NEGATIVE mg/dL
Hgb urine dipstick: NEGATIVE
Ketones, ur: NEGATIVE mg/dL
Leukocytes, UA: NEGATIVE
NITRITE: NEGATIVE
PROTEIN: NEGATIVE mg/dL
Specific Gravity, Urine: 1.009 (ref 1.005–1.030)
pH: 5 (ref 5.0–8.0)

## 2018-09-14 LAB — CBC WITH DIFFERENTIAL/PLATELET
Abs Immature Granulocytes: 0.02 10*3/uL (ref 0.00–0.07)
Basophils Absolute: 0.1 10*3/uL (ref 0.0–0.1)
Basophils Relative: 1 %
Eosinophils Absolute: 0.1 10*3/uL (ref 0.0–0.5)
Eosinophils Relative: 2 %
HCT: 44.9 % (ref 39.0–52.0)
Hemoglobin: 14.3 g/dL (ref 13.0–17.0)
IMMATURE GRANULOCYTES: 0 %
Lymphocytes Relative: 16 %
Lymphs Abs: 1.1 10*3/uL (ref 0.7–4.0)
MCH: 30.2 pg (ref 26.0–34.0)
MCHC: 31.8 g/dL (ref 30.0–36.0)
MCV: 94.7 fL (ref 80.0–100.0)
MONOS PCT: 12 %
Monocytes Absolute: 0.8 10*3/uL (ref 0.1–1.0)
NEUTROS PCT: 69 %
Neutro Abs: 4.6 10*3/uL (ref 1.7–7.7)
Platelets: 295 10*3/uL (ref 150–400)
RBC: 4.74 MIL/uL (ref 4.22–5.81)
RDW: 13.7 % (ref 11.5–15.5)
WBC: 6.8 10*3/uL (ref 4.0–10.5)
nRBC: 0 % (ref 0.0–0.2)

## 2018-09-14 LAB — ECHOCARDIOGRAM COMPLETE
Height: 72 in
Weight: 2771.2 oz

## 2018-09-14 LAB — GLUCOSE, CAPILLARY
Glucose-Capillary: 136 mg/dL — ABNORMAL HIGH (ref 70–99)
Glucose-Capillary: 163 mg/dL — ABNORMAL HIGH (ref 70–99)
Glucose-Capillary: 87 mg/dL (ref 70–99)
Glucose-Capillary: 100 mg/dL — ABNORMAL HIGH (ref 70–99)

## 2018-09-14 LAB — BASIC METABOLIC PANEL
Anion gap: 14 (ref 5–15)
BUN: 10 mg/dL (ref 8–23)
CO2: 28 mmol/L (ref 22–32)
Calcium: 9.2 mg/dL (ref 8.9–10.3)
Chloride: 95 mmol/L — ABNORMAL LOW (ref 98–111)
Creatinine, Ser: 1.08 mg/dL (ref 0.61–1.24)
GFR calc Af Amer: >60 mL/min (ref >60)
GFR calc non Af Amer: >60 mL/min (ref >60)
GLUCOSE: 126 mg/dL — AB (ref 70–99)
Potassium: 4.1 mmol/L (ref 3.5–5.1)
Sodium: 137 mmol/L (ref 135–145)

## 2018-09-14 LAB — STREP PNEUMONIAE URINARY ANTIGEN: Strep Pneumo Urinary Antigen: NEGATIVE

## 2018-09-14 LAB — HEMOGLOBIN A1C
Hgb A1c MFr Bld: 5.9 % — ABNORMAL HIGH (ref 4.8–5.6)
Mean Plasma Glucose: 122.63 mg/dL

## 2018-09-14 LAB — PROCALCITONIN: Procalcitonin: <0.1 ng/mL

## 2018-09-14 MED ORDER — ALBUTEROL SULFATE (2.5 MG/3ML) 0.083% IN NEBU: 2.5000 mg | INHALATION_SOLUTION | Freq: Three times a day (TID) | RESPIRATORY_TRACT | Status: DC

## 2018-09-14 MED ORDER — DM-GUAIFENESIN ER 30-600 MG PO TB12
2.0000 | ORAL_TABLET | Freq: Two times a day (BID) | ORAL | Status: DC
  Administered 2018-09-14 – 2018-09-15 (×3): 2 via ORAL
  Filled 2018-09-14: qty 2
  Filled 2018-09-14: qty 1
  Filled 2018-09-14: qty 2

## 2018-09-14 MED ORDER — IPRATROPIUM BROMIDE 0.02 % IN SOLN: 0.5000 mg | Freq: Three times a day (TID) | RESPIRATORY_TRACT | Status: DC

## 2018-09-14 MED ORDER — IPRATROPIUM-ALBUTEROL 0.5-2.5 (3) MG/3ML IN SOLN
3.0000 mL | Freq: Four times a day (QID) | RESPIRATORY_TRACT | Status: DC
  Administered 2018-09-14: 3 mL via RESPIRATORY_TRACT
  Filled 2018-09-14: qty 3

## 2018-09-14 NOTE — Care Management Note (Signed)
Case Management Note  Patient Details  Name: Jene Oravec MRN: [id number] Date of Birth: 09/08/1941  Subjective/Objective:    Pneumonia               Action/Plan: Patient lives at home with spouse; PCP: Heywood Bene, PA-C; has private insurance with Medicare; he does not have a part D prescription drug plan; patient stated that his pharmacy is [facility]; DME - none; spouse continues to drive and takes him to his apts. Independent of his ADL's.  Expected Discharge Date:    possibly 09/17/2018              Expected Discharge Plan:  Home/Self Care  Discharge planning Services  CM Consult    Status of Service:  In process, will continue to follow  Sherrilyn Rist [phone] 09/14/2018, 4:08 PM

## 2018-09-14 NOTE — Consult Note (Signed)
Reason for Consult: Severe symptomatic aortic stenosis/decompensated systolic congestive heart failure Referring Physician: [facility]  Geoffrey Benjamin is an 77 y.o. male.  HPI: Patient is 77 year old male with past medical history significant for moderate aortic stenosis, diabetes mellitus, hyperlipidemia, 60+ pack years history of tobacco abuse continues to smoke, was admitted because of progressive worsening shortness of breath for last for 5 weeks associated with cough feeling tired fatigue.  Patient also complains of PND orthopnea but denies any leg swelling.  Denies palpitation lightheadedness or syncope.  Denies any chest pain.  Cardiology consultation is called as patient was noted to have severe aortic stenosis with valve area of 0.85cm and mean gradient of 36 mmHg associated with moderate AI and mild to moderate MR associated with acute pulmonary edema.  Patient received IV Lasix with good diuresis and improvement in his symptoms.  Patient presently denies any shortness of breath.  Denies any chest pain.  Eager to go home.  Past Medical History:  Diagnosis Date  . Aortic stenosis 09/13/2018  . DM (diabetes mellitus) ([facility])   . Hyperlipidemia 09/13/2018  . Tachycardia     Past Surgical History:  Procedure Laterality Date  . DOPPLER ECHOCARDIOGRAPHY     Mitral inflow and tissue doppler consistent with impaired LV relaxation; Trileaflet aortic vlave with moderate aortic valve stenosis, Trivial mitral and tricuspid valve regurgitation.    Family History  Problem Relation Age of Onset  . Kidney disease Father   . Stroke Mother     Social History:  reports that he has been smoking cigarettes. He has been smoking about 2.00 packs per day. He has never used smokeless tobacco. He reports current alcohol use. He reports that he does not use drugs.  Allergies: No Known Allergies  Medications: I have reviewed the patient's current medications.  Results for orders placed or performed  during the hospital encounter of 09/13/18 (from the past 48 hour(s))  Brain natriuretic peptide     Status: Abnormal   Collection Time: 09/13/18 11:21 AM  Result Value Ref Range   B Natriuretic Peptide 927.4 (H) 0.0 - 100.0 pg/mL    Comment: Performed at [facility], [address], [place], [place] [address]  Basic metabolic panel     Status: Abnormal   Collection Time: 09/13/18 11:31 AM  Result Value Ref Range   Sodium 137 135 - 145 mmol/L   Potassium 4.1 3.5 - 5.1 mmol/L   Chloride 98 98 - 111 mmol/L   CO2 23 22 - 32 mmol/L   Glucose, Bld 201 (H) 70 - 99 mg/dL   BUN 9 8 - 23 mg/dL   Creatinine, Ser 0.96 0.61 - 1.24 mg/dL   Calcium 9.1 8.9 - 10.3 mg/dL   GFR calc non Af Amer >60 >60 mL/min   GFR calc Af Amer >60 >60 mL/min   Anion gap 16 (H) 5 - 15    Comment: Performed at [facility] [address], [place], [place] [address]  CBC with Differential     Status: None   Collection Time: 09/13/18 11:31 AM  Result Value Ref Range   WBC 8.6 4.0 - 10.5 K/uL   RBC 4.91 4.22 - 5.81 MIL/uL   Hemoglobin 15.0 13.0 - 17.0 g/dL   HCT 46.7 39.0 - 52.0 %   MCV 95.1 80.0 - 100.0 fL   MCH 30.5 26.0 - 34.0 pg   MCHC 32.1 30.0 - 36.0 g/dL   RDW 13.6 11.5 - 15.5 %  Platelets 278 150 - 400 K/uL   nRBC 0.0 0.0 - 0.2 %   Neutrophils Relative % 78 %   Neutro Abs 6.7 1.7 - 7.7 K/uL   Lymphocytes Relative 11 %   Lymphs Abs 0.9 0.7 - 4.0 K/uL   Monocytes Relative 8 %   Monocytes Absolute 0.7 0.1 - 1.0 K/uL   Eosinophils Relative 1 %   Eosinophils Absolute 0.1 0.0 - 0.5 K/uL   Basophils Relative 1 %   Basophils Absolute 0.1 0.0 - 0.1 K/uL   Immature Granulocytes 1 %   Abs Immature Granulocytes 0.04 0.00 - 0.07 K/uL    Comment: Performed at [facility] [address] [place], [place] [address]  Protime-INR     Status: None   Collection Time: 09/13/18 11:31 AM  Result Value Ref Range   Prothrombin Time 13.7 11.4 - 15.2 seconds   INR 1.06     Comment: Performed at  [facility], [place] [address], [place], [place] [address]  I-stat troponin, ED     Status: None   Collection Time: 09/13/18 11:37 AM  Result Value Ref Range   Troponin i, poc 0.03 0.00 - 0.08 ng/mL   Comment 3            Comment: Due to the release kinetics of cTnI, a negative result within the first hours of the onset of symptoms does not rule out myocardial infarction with certainty. If myocardial infarction is still suspected, repeat the test at appropriate intervals.   I-Stat CG4 Lactic Acid, ED     Status: None   Collection Time: 09/13/18 11:39 AM  Result Value Ref Range   Lactic Acid, Venous 1.76 0.5 - 1.9 mmol/L  I-Stat CG4 Lactic Acid, ED     Status: None   Collection Time: 09/13/18  2:14 PM  Result Value Ref Range   Lactic Acid, Venous 1.42 0.5 - 1.9 mmol/L  Magnesium     Status: None   Collection Time: 09/13/18  3:34 PM  Result Value Ref Range   Magnesium 1.7 1.7 - 2.4 mg/dL    Comment: Performed at [facility], [place] [address] [place], [place] [address]  Phosphorus     Status: None   Collection Time: 09/13/18  3:34 PM  Result Value Ref Range   Phosphorus 3.7 2.5 - 4.6 mg/dL    Comment: Performed at [facility] [address], [place], [place] [address]  Glucose, capillary     Status: Abnormal   Collection Time: 09/13/18  9:03 PM  Result Value Ref Range   Glucose-Capillary 145 (H) 70 - 99 mg/dL  Strep pneumoniae urinary antigen     Status: None   Collection Time: 09/14/18  4:27 AM  Result Value Ref Range   Strep Pneumo Urinary Antigen NEGATIVE NEGATIVE    Comment:        Infection due to S. pneumoniae cannot be absolutely ruled out since the antigen present may be below the detection limit of the test. Performed at [facility], [place] [address], [place], [place] [address]   Urinalysis, Routine w reflex microscopic     Status: None   Collection Time: 09/14/18  4:33 AM  Result Value Ref Range   Color, Urine YELLOW YELLOW   APPearance  CLEAR CLEAR   Specific Gravity, Urine 1.009 1.005 - 1.030   pH 5.0 5.0 - 8.0   Glucose, UA NEGATIVE NEGATIVE mg/dL   Hgb urine dipstick  NEGATIVE NEGATIVE   Bilirubin Urine NEGATIVE NEGATIVE   Ketones, ur NEGATIVE NEGATIVE mg/dL   Protein, ur NEGATIVE NEGATIVE mg/dL   Nitrite NEGATIVE NEGATIVE   Leukocytes, UA NEGATIVE NEGATIVE    Comment: Performed at [facility] [address], [place], [place] [address]  Basic metabolic panel     Status: Abnormal   Collection Time: 09/14/18  7:03 AM  Result Value Ref Range   Sodium 137 135 - 145 mmol/L   Potassium 4.1 3.5 - 5.1 mmol/L   Chloride 95 (L) 98 - 111 mmol/L   CO2 28 22 - 32 mmol/L   Glucose, Bld 126 (H) 70 - 99 mg/dL   BUN 10 8 - 23 mg/dL   Creatinine, Ser 1.08 0.61 - 1.24 mg/dL   Calcium 9.2 8.9 - 10.3 mg/dL   GFR calc non Af Amer >60 >60 mL/min   GFR calc Af Amer >60 >60 mL/min   Anion gap 14 5 - 15    Comment: Performed at [facility], [place] [address], [place], [place] [address]  CBC WITH DIFFERENTIAL     Status: None   Collection Time: 09/14/18  7:03 AM  Result Value Ref Range   WBC 6.8 4.0 - 10.5 K/uL   RBC 4.74 4.22 - 5.81 MIL/uL   Hemoglobin 14.3 13.0 - 17.0 g/dL   HCT 44.9 39.0 - 52.0 %   MCV 94.7 80.0 - 100.0 fL   MCH 30.2 26.0 - 34.0 pg   MCHC 31.8 30.0 - 36.0 g/dL   RDW 13.7 11.5 - 15.5 %   Platelets 295 150 - 400 K/uL   nRBC 0.0 0.0 - 0.2 %   Neutrophils Relative % 69 %   Neutro Abs 4.6 1.7 - 7.7 K/uL   Lymphocytes Relative 16 %   Lymphs Abs 1.1 0.7 - 4.0 K/uL   Monocytes Relative 12 %   Monocytes Absolute 0.8 0.1 - 1.0 K/uL   Eosinophils Relative 2 %   Eosinophils Absolute 0.1 0.0 - 0.5 K/uL   Basophils Relative 1 %   Basophils Absolute 0.1 0.0 - 0.1 K/uL   Immature Granulocytes 0 %   Abs Immature Granulocytes 0.02 0.00 - 0.07 K/uL    Comment: Performed at [facility], [address] [place], [place] [address]  Hemoglobin A1c     Status: Abnormal   Collection Time: 09/14/18  7:03 AM   Result Value Ref Range   Hgb A1c MFr Bld 5.9 (H) 4.8 - 5.6 %    Comment: (NOTE) Pre diabetes:          5.7%-6.4% Diabetes:              >6.4% Glycemic control for   <7.0% adults with diabetes    Mean Plasma Glucose 122.63 mg/dL    Comment: Performed at [facility] [address] [place], [place] [address]  Procalcitonin - Baseline     Status: None   Collection Time: 09/14/18  7:03 AM  Result Value Ref Range   Procalcitonin <0.10 ng/mL    Comment:        Interpretation: PCT (Procalcitonin) <= 0.5 ng/mL: Systemic infection (sepsis) is not likely. Local bacterial infection is possible. (NOTE)       Sepsis PCT Algorithm           Lower Respiratory Tract  Infection PCT Algorithm    ----------------------------     ----------------------------         PCT < 0.25 ng/mL                PCT < 0.10 ng/mL         Strongly encourage             Strongly discourage   discontinuation of antibiotics    initiation of antibiotics    ----------------------------     -----------------------------       PCT 0.25 - 0.50 ng/mL            PCT 0.10 - 0.25 ng/mL               OR       >80% decrease in PCT            Discourage initiation of                                            antibiotics      Encourage discontinuation           of antibiotics    ----------------------------     -----------------------------         PCT >= 0.50 ng/mL              PCT 0.26 - 0.50 ng/mL               AND        <80% decrease in PCT             Encourage initiation of                                             antibiotics       Encourage continuation           of antibiotics    ----------------------------     -----------------------------        PCT >= 0.50 ng/mL                  PCT > 0.50 ng/mL               AND         increase in PCT                  Strongly encourage                                      initiation of antibiotics    Strongly encourage  escalation           of antibiotics                                     -----------------------------                                           PCT <= 0.25 ng/mL  OR                                        > 80% decrease in PCT                                     Discontinue / Do not initiate                                             antibiotics Performed at [facility], [place] [address], [place], [place] [address]   Glucose, capillary     Status: Abnormal   Collection Time: 09/14/18  9:02 AM  Result Value Ref Range   Glucose-Capillary 136 (H) 70 - 99 mg/dL  Glucose, capillary     Status: Abnormal   Collection Time: 09/14/18 12:23 PM  Result Value Ref Range   Glucose-Capillary 163 (H) 70 - 99 mg/dL  Glucose, capillary     Status: None   Collection Time: 09/14/18  4:57 PM  Result Value Ref Range   Glucose-Capillary 87 70 - 99 mg/dL    Dg Chest 2 View  Result Date: 09/13/2018 CLINICAL DATA:  Shortness of Breath EXAM: CHEST - 2 VIEW COMPARISON:  September 12, 2018 chest radiograph and chest CT December 25, 2008 FINDINGS: There is persistent cardiomegaly with pulmonary venous hypertension. There is interstitial edema throughout the lungs bilaterally. There are areas of patchy airspace opacity in the right upper and left lower lobe regions. There is a small right pleural effusion. There is aortic atherosclerosis. No adenopathy. There is degenerative change in the lower thoracic spine. IMPRESSION: Pulmonary vascular congestion with interstitial edema and small right pleural effusion. Suspect a degree of congestive heart failure. Patchy airspace opacity in the right upper lobe and left base regions may represent alveolar edema or patchy multifocal pneumonia. Suspect pneumonia superimposed on a degree of congestive heart failure. There is aortic atherosclerosis. No evident adenopathy. Aortic Atherosclerosis (ICD10-I70.0). Electronically Signed    By: Lowella Grip III M.D.   On: 09/13/2018 11:55    Review of Systems  Constitutional: Positive for malaise/fatigue. Negative for chills and fever.  HENT: Negative for hearing loss.   Eyes: Negative for blurred vision.  Respiratory: Positive for cough and shortness of breath.   Cardiovascular: Positive for orthopnea and PND. Negative for chest pain, palpitations and leg swelling.  Gastrointestinal: Negative for nausea and vomiting.  Neurological: Negative for dizziness.   Blood pressure 105/72, pulse 81, temperature 98.5 F (36.9 C), temperature source Oral, resp. rate 17, height 6' (1.829 m), weight 78.6 kg, SpO2 95 %. Physical Exam  Constitutional: He is oriented to person, place, and time.  HENT:  Head: Normocephalic and atraumatic.  Eyes: Pupils are equal, round, and reactive to light. Conjunctivae are normal.  Neck: Normal range of motion. Neck supple. No JVD present. No tracheal deviation present. No thyromegaly present.  Cardiovascular: Normal rate and regular rhythm.  Murmur (3/6 systolic and soft diastolic murmur noted) heard. Respiratory:  Decreased breath sounds at bases with occasional bilateral rhonchi and rales  noted  GI: Soft. Bowel sounds are normal. He exhibits no distension. There is no abdominal tenderness. There is no rebound and no guarding.  Musculoskeletal:        General: No tenderness, deformity or edema.  Neurological: He is alert and oriented to person, place, and time.    Assessment/Plan: Severe symptomatic aortic stenosis Resolving decompensated systolic congestive heart failure secondary to above Diabetes mellitus Hyperlipidemia Tobacco abuse Plan Agree with present management Discussed with patient and his wife at length regarding 2D echo findings and need for left cardiac catheterization and possible CABG/AVR versus percutaneous TAVR its risk and benefits patient states he will think about it not ready for any invasive procedure in fact  eager to go home.  Patient also extensively counseled regarding lifestyle changes smoking cessation etc. Check lipid panel/hemoglobin A1c Charolette Forward 09/14/2018, 6:55 PM

## 2018-09-14 NOTE — Progress Notes (Signed)
Echocardiogram 2D Echocardiogram has been performed.  09/14/2018 1:04 PM Maudry Mayhew, MHA, RVT, RDCS, RDMS

## 2018-09-14 NOTE — Progress Notes (Addendum)
PROGRESS NOTE  Geoffrey Benjamin TDS:[id number] DOB: 03/18/1941 DOA: 09/13/2018 PCP: Heywood Bene, PA-C  HPI/Recap of past 24 hours: Geoffrey Benjamin is a 77 y.o. male with medical history significant of type 2 diabetes, hyperlipidemia, history of unspecified tachycardia, aortic stenosis who was coming to the emergency department with complaints of a progressively worse dyspnea for the past 4 to 5 weeks associated with dry cough and fatigue.  He denies fever, chills, sore throat, but stated he has been having rhinorrhea.  He denies hemoptysis, but complains of occasional wheezing.  No chest pain, palpitations, dizziness, diaphoresis, PND or pitting edema of the lower extremities.  However he has been having orthopnea.  Denies abdominal pain, nausea, emesis, diarrhea, constipation, melena or hematochezia.  No dysuria, frequency, hematuria, but complains of nocturia 3 times a night.  Denies heat or cold intolerance, polyuria, polydipsia, polyphagia or blurred vision. Denies skin pruritus or rashes.  09/14/2018: Patient seen and examined with his wife at his bedside.  Reports persistent dyspnea with minimal exertion.  Associated with a dry persistent cough.  Denies any chest pain.  2D echo done revealed severely reduced LVEF of 30 to 35% with diffuse hypokinesis complicated by moderate to severe increased pulmonary arteries with peak pressure of 58 mmhg and moderate aortic stenosis.  Cardiology Dr Terrence Dupont consulted.    Assessment/Plan: Principal Problem:   CAP (community acquired pneumonia) Active Problems:   Type 2 diabetes mellitus ([facility])   GERD   Hyperlipidemia   Volume overload   Tobacco use   Aortic stenosis  Newly diagnosed acute systolic CHF 2D echo done on 10/01/2018 revealed severely reduced LVEF of 30 to 35% with diffuse hypokinesis, moderate to severe increase pulmonary arteries and moderate aortic stenosis. Cardiology consulted Obtain lipid panel, TSH, and A1c Twelve-lead EKG done  on admission revealed sinus rhythm with no specific ST-T changes Continue to closely monitor on telemetry Start strict I's and O's and daily weight  Pulmonary edema, suspect cardiogenic Independent review chest x-ray done on admission which revealed cardiomegaly with increase in pulmonary vascularity suggestive of pulmonary edema Started IV Lasix 40 mg daily Monitor urine output Monitor fluid status  Community-acquired pneumonia, ruled out Procalcitonin less than 0.10 Afebrile with no leukocytosis No productive cough No sign of active infective process  Type 2 diabetes On glimepiride and metformin at home Hold glimepiride and start insulin sliding scale with sensitive correction Hemoglobin A1c 5.9 Avoid hypoglycemia  Hypertension Continue home antihypertensive medication On metoprolol tartrate 25 mg twice daily and lisinopril 2.5 mg daily Added IV Lasix due to necessity for diuresis  GERD Continue Protonix  Hyperlipidemia Obtain lipid panel Continue pravastatin  Current tobacco user, 40+ years Tobacco cessation counseling done at bedside Nicotine patch provided  Risks: High risk for decompensation due to newly diagnosed acute systolic CHF with severely reduced LVEF, multiple comorbidities, and advanced age.  Patient will need at least 2 midnights to further evaluate and treat his present condition.  Cardiology has been consulted.  Patient might require heart cath, defer to cardiology.   DVT prophylaxis:  Subcu heparin 3 times daily Code Status: Full code. Family Communication:  Updated his wife at bedside.  All questions answered to her satisfaction.. Disposition Plan: Dc to home when cardiology signs off. Consults called:  Cardiology   Objective: Vitals:   09/14/18 0428 09/14/18 0922 09/14/18 1225 09/14/18 1614  BP: 118/74 111/66 100/64 105/72  Pulse: 89 96 80 81  Resp: 18 18 17    Temp: 98.2 F (36.8 C)  98.9 F (37.2 C) (!) 97.3 F (36.3 C) 98.5 F (36.9 C)   TempSrc: Oral Oral Oral Oral  SpO2: 97% 96% 97% 95%  Weight:      Height:        Intake/Output Summary (Last 24 hours) at 09/14/2018 1722 Last data filed at 09/14/2018 1011 Gross per 24 hour  Intake 960 ml  Output 1000 ml  Net -40 ml   Filed Weights   09/13/18 1117 09/14/18 0056  Weight: 82.6 kg 78.6 kg    Exam:  . General: 77 y.o. year-old male well developed well nourished in no acute distress.  Alert and oriented x3. . Cardiovascular: Regular rate and rhythm with no rubs or gallops.  No thyromegaly.  Mild right-sided JVD. Marland Kitchen Respiratory: Diffuse rales bilaterally with no wheezes.  Good inspiratory effort. . Abdomen: Soft nontender nondistended with normal bowel sounds x4 quadrants. . Musculoskeletal: No lower extremity edema. 2/4 pulses in all 4 extremities. . Skin: No ulcerative lesions noted or rashes, . Psychiatry: Mood is appropriate for condition and setting   Data Reviewed: CBC: Recent Labs  Lab 09/13/18 1131 09/14/18 0703  WBC 8.6 6.8  NEUTROABS 6.7 4.6  HGB 15.0 14.3  HCT 46.7 44.9  MCV 95.1 94.7  PLT 278 798   Basic Metabolic Panel: Recent Labs  Lab 09/13/18 1131 09/13/18 1534 09/14/18 0703  NA 137  --  137  K 4.1  --  4.1  CL 98  --  95*  CO2 23  --  28  GLUCOSE 201*  --  126*  BUN 9  --  10  CREATININE 0.96  --  1.08  CALCIUM 9.1  --  9.2  MG  --  1.7  --   PHOS  --  3.7  --    GFR: Estimated Creatinine Clearance: 64.9 mL/min (by C-G formula based on SCr of 1.08 mg/dL). Liver Function Tests: No results for input(s): AST, ALT, ALKPHOS, BILITOT, PROT, ALBUMIN in the last 168 hours. No results for input(s): LIPASE, AMYLASE in the last 168 hours. No results for input(s): AMMONIA in the last 168 hours. Coagulation Profile: Recent Labs  Lab 09/13/18 1131  INR 1.06   Cardiac Enzymes: No results for input(s): CKTOTAL, CKMB, CKMBINDEX, TROPONINI in the last 168 hours. BNP (last 3 results) No results for input(s): PROBNP in the last 8760  hours. HbA1C: Recent Labs    09/14/18 0703  HGBA1C 5.9*   CBG: Recent Labs  Lab 09/13/18 2103 09/14/18 0902 09/14/18 1223 09/14/18 1657  GLUCAP 145* 136* 163* 87   Lipid Profile: No results for input(s): CHOL, HDL, LDLCALC, TRIG, CHOLHDL, LDLDIRECT in the last 72 hours. Thyroid Function Tests: No results for input(s): TSH, T4TOTAL, FREET4, T3FREE, THYROIDAB in the last 72 hours. Anemia Panel: No results for input(s): VITAMINB12, FOLATE, FERRITIN, TIBC, IRON, RETICCTPCT in the last 72 hours. Urine analysis:    Component Value Date/Time   COLORURINE YELLOW 09/14/2018 [place] 09/14/2018 0433   LABSPEC 1.009 09/14/2018 0433   PHURINE 5.0 09/14/2018 0433   GLUCOSEU NEGATIVE 09/14/2018 0433   HGBUR NEGATIVE 09/14/2018 0433   BILIRUBINUR NEGATIVE 09/14/2018 0433   KETONESUR NEGATIVE 09/14/2018 0433   PROTEINUR NEGATIVE 09/14/2018 0433   NITRITE NEGATIVE 09/14/2018 0433   LEUKOCYTESUR NEGATIVE 09/14/2018 0433   Sepsis Labs: @LABRCNTIP (procalcitonin:4,lacticidven:4)  )No results found for this or any previous visit (from the past 240 hour(s)).    Studies: No results found.  Scheduled Meds: . aspirin EC  81  mg Oral Daily  . azithromycin  500 mg Oral Q24H  . dextromethorphan-guaiFENesin  2 tablet Oral BID  . furosemide  40 mg Intravenous Daily  . glimepiride  4 mg Oral Q1200  . heparin  5,000 Units Subcutaneous Q8H  . insulin aspart  0-9 Units Subcutaneous TID WC  . lisinopril  2.5 mg Oral Daily  . metFORMIN  1,000 mg Oral BID WC  . metoprolol tartrate  25 mg Oral BID  . nicotine  21 mg Transdermal Q24H  . pantoprazole  40 mg Oral Daily  . potassium chloride  20 mEq Oral BID  . pravastatin  40 mg Oral Daily    Continuous Infusions: . cefTRIAXone (ROCEPHIN)  IV 1 g (09/14/18 1655)     LOS: 1 day     Kayleen Memos, MD [facility] Pager [phone]  If 7PM-7AM, please contact night-coverage [email or web address] Password  [email or web address] 09/14/2018, 5:22 PM

## 2018-09-14 NOTE — Progress Notes (Signed)
Pt states he doesn't feel like he needs breathing tx, pt states he doesn't notice any benefit after taking treatments and prefers not to take nebs.

## 2018-09-14 NOTE — Plan of Care (Signed)
  Problem: Clinical Measurements: Goal: Will remain free from infection Outcome: Progressing   Problem: Clinical Measurements: Goal: Respiratory complications will improve Outcome: Progressing   Problem: Clinical Measurements: Goal: Cardiovascular complication will be avoided Outcome: Progressing   

## 2018-09-15 LAB — CBC WITH DIFFERENTIAL/PLATELET
Abs Immature Granulocytes: 0.03 10*3/uL (ref 0.00–0.07)
BASOS PCT: 1 %
Basophils Absolute: 0.1 10*3/uL (ref 0.0–0.1)
Eosinophils Absolute: 0.1 10*3/uL (ref 0.0–0.5)
Eosinophils Relative: 2 %
HCT: 43.8 % (ref 39.0–52.0)
Hemoglobin: 14.3 g/dL (ref 13.0–17.0)
Immature Granulocytes: 0 %
Lymphocytes Relative: 15 %
Lymphs Abs: 1.1 10*3/uL (ref 0.7–4.0)
MCH: 30.4 pg (ref 26.0–34.0)
MCHC: 32.6 g/dL (ref 30.0–36.0)
MCV: 93.2 fL (ref 80.0–100.0)
MONOS PCT: 12 %
Monocytes Absolute: 0.8 10*3/uL (ref 0.1–1.0)
Neutro Abs: 4.8 10*3/uL (ref 1.7–7.7)
Neutrophils Relative %: 70 %
Platelets: 260 10*3/uL (ref 150–400)
RBC: 4.7 MIL/uL (ref 4.22–5.81)
RDW: 13.3 % (ref 11.5–15.5)
WBC: 6.9 10*3/uL (ref 4.0–10.5)
nRBC: 0 % (ref 0.0–0.2)

## 2018-09-15 LAB — BASIC METABOLIC PANEL
Anion gap: 15 (ref 5–15)
BUN: 15 mg/dL (ref 8–23)
CALCIUM: 9 mg/dL (ref 8.9–10.3)
CO2: 23 mmol/L (ref 22–32)
Chloride: 97 mmol/L — ABNORMAL LOW (ref 98–111)
Creatinine, Ser: 1.02 mg/dL (ref 0.61–1.24)
GFR calc Af Amer: >60 mL/min (ref >60)
Glucose, Bld: 110 mg/dL — ABNORMAL HIGH (ref 70–99)
Potassium: 4.9 mmol/L (ref 3.5–5.1)
Sodium: 135 mmol/L (ref 135–145)

## 2018-09-15 LAB — LIPID PANEL
CHOLESTEROL: 128 mg/dL (ref 0–200)
HDL: 30 mg/dL — ABNORMAL LOW (ref >40)
LDL Cholesterol: 75 mg/dL (ref 0–99)
Total CHOL/HDL Ratio: 4.3 RATIO
Triglycerides: 114 mg/dL (ref <150)
VLDL: 23 mg/dL (ref 0–40)

## 2018-09-15 LAB — TSH: TSH: 2.479 u[IU]/mL (ref 0.350–4.500)

## 2018-09-15 LAB — MAGNESIUM: Magnesium: 2 mg/dL (ref 1.7–2.4)

## 2018-09-15 LAB — GLUCOSE, CAPILLARY: Glucose-Capillary: 127 mg/dL — ABNORMAL HIGH (ref 70–99)

## 2018-09-15 LAB — PHOSPHORUS: Phosphorus: 4.1 mg/dL (ref 2.5–4.6)

## 2018-09-15 LAB — HEMOGLOBIN A1C
Hgb A1c MFr Bld: 6 % — ABNORMAL HIGH (ref 4.8–5.6)
MEAN PLASMA GLUCOSE: 125.5 mg/dL

## 2018-09-15 MED ORDER — METOPROLOL TARTRATE 25 MG PO TABS: 25.0000 mg | ORAL_TABLET | Freq: Two times a day (BID) | ORAL | 0 refills | Status: DC

## 2018-09-15 MED ORDER — LISINOPRIL 2.5 MG PO TABS: 2.5000 mg | ORAL_TABLET | Freq: Every day | ORAL | 0 refills | Status: DC

## 2018-09-15 MED ORDER — NICOTINE 21 MG/24HR TD PT24: 21.0000 mg | MEDICATED_PATCH | Freq: Every day | TRANSDERMAL | 0 refills | Status: DC

## 2018-09-15 MED ORDER — PANTOPRAZOLE SODIUM 40 MG PO TBEC: 40.0000 mg | DELAYED_RELEASE_TABLET | Freq: Every day | ORAL | 0 refills | Status: DC

## 2018-09-15 MED ORDER — METFORMIN HCL 1000 MG PO TABS: 1000.0000 mg | ORAL_TABLET | Freq: Two times a day (BID) | ORAL | 0 refills | Status: DC

## 2018-09-15 MED ORDER — FUROSEMIDE 40 MG PO TABS: 40.0000 mg | ORAL_TABLET | Freq: Every day | ORAL | 0 refills | Status: DC

## 2018-09-15 MED ORDER — PRAVASTATIN SODIUM 40 MG PO TABS: 40.0000 mg | ORAL_TABLET | Freq: Every day | ORAL | 0 refills | Status: DC

## 2018-09-15 MED ORDER — POTASSIUM CHLORIDE CRYS ER 20 MEQ PO TBCR: 20.0000 meq | EXTENDED_RELEASE_TABLET | Freq: Every day | ORAL | 0 refills | Status: DC

## 2018-09-15 NOTE — Progress Notes (Signed)
Subjective:  Patient denies any chest pain or shortness of breath walking in the room eager to go home.  States will see the dentist on Monday and then may consider cardiac catheterization as outpatient.  Patient understands risk and benefits and wants to leave.  Objective:  Vital Signs in the last 24 hours: Temp:  [97.3 F (36.3 C)-98.5 F (36.9 C)] 97.4 F (36.3 C) (12/14 0541) Pulse Rate:  [80-92] 85 (12/14 0541) Resp:  [17-18] 18 (12/14 0541) BP: (100-125)/(64-79) 120/75 (12/14 0541) SpO2:  [95 %-98 %] 98 % (12/14 0541) Weight:  [78.7 kg] 78.7 kg (12/14 0541)  Intake/Output from previous day: 12/13 0701 - 12/14 0700 In: 600 [P.O.:600] Out: 500 [Urine:500] Intake/Output from this shift: No intake/output data recorded.  Physical Exam: Exam unchanged  Lab Results: Recent Labs    09/14/18 0703 09/15/18 0607  WBC 6.8 6.9  HGB 14.3 14.3  PLT 295 260   Recent Labs    09/14/18 0703 09/15/18 0607  NA 137 135  K 4.1 4.9  CL 95* 97*  CO2 28 23  GLUCOSE 126* 110*  BUN 10 15  CREATININE 1.08 1.02   No results for input(s): TROPONINI in the last 72 hours.  Invalid input(s): CK, MB Hepatic Function Panel No results for input(s): PROT, ALBUMIN, AST, ALT, ALKPHOS, BILITOT, BILIDIR, IBILI in the last 72 hours. No results for input(s): CHOL in the last 72 hours. No results for input(s): PROTIME in the last 72 hours.  Imaging: Imaging results have been reviewed and Dg Chest 2 View  Result Date: 09/13/2018 CLINICAL DATA:  Shortness of Breath EXAM: CHEST - 2 VIEW COMPARISON:  September 12, 2018 chest radiograph and chest CT December 25, 2008 FINDINGS: There is persistent cardiomegaly with pulmonary venous hypertension. There is interstitial edema throughout the lungs bilaterally. There are areas of patchy airspace opacity in the right upper and left lower lobe regions. There is a small right pleural effusion. There is aortic atherosclerosis. No adenopathy. There is degenerative  change in the lower thoracic spine. IMPRESSION: Pulmonary vascular congestion with interstitial edema and small right pleural effusion. Suspect a degree of congestive heart failure. Patchy airspace opacity in the right upper lobe and left base regions may represent alveolar edema or patchy multifocal pneumonia. Suspect pneumonia superimposed on a degree of congestive heart failure. There is aortic atherosclerosis. No evident adenopathy. Aortic Atherosclerosis (ICD10-I70.0). Electronically Signed   By: Lowella Grip III M.D.   On: 09/13/2018 11:55    Cardiac Studies:  Assessment/Plan:  Severe symptomatic aortic stenosis Resolving decompensated systolic congestive heart failure secondary to above Diabetes mellitus Hyperlipidemia Tobacco abuse Plan Discussed again with patient regarding doing the cardiac catheterization on Monday and then will have a clear picture whether he will need CABG/AVR versus AVR only, but wanted to leave. I will follow-up in 1 to 2 weeks as outpatient  LOS: 2 days    Charolette Forward 09/15/2018, 9:58 AM

## 2018-09-15 NOTE — Discharge Instructions (Signed)
Heart Failure Heart failure means your heart has trouble pumping blood. This makes it hard for your body to work well. Heart failure is usually a long-term (chronic) condition. You must take good care of yourself and follow your doctor's treatment plan. Follow these instructions at home:  Take your heart medicine as told by your doctor. ? Do not stop taking medicine unless your doctor tells you to. ? Do not skip any dose of medicine. ? Refill your medicines before they run out. ? Take other medicines only as told by your doctor or pharmacist.  Stay active if told by your doctor. The elderly and people with severe heart failure should talk with a doctor about physical activity.  Eat heart-healthy foods. Choose foods that are without trans fat and are low in saturated fat, cholesterol, and salt (sodium). This includes fresh or frozen fruits and vegetables, fish, lean meats, fat-free or low-fat dairy foods, whole grains, and high-fiber foods. Lentils and dried peas and beans (legumes) are also good choices.  Limit salt if told by your doctor.  Cook in a healthy way. Roast, grill, broil, bake, poach, steam, or stir-fry foods.  Limit fluids as told by your doctor.  Weigh yourself every morning. Do this after you pee (urinate) and before you eat breakfast. Write down your weight to give to your doctor.  Take your blood pressure and write it down if your doctor tells you to.  Ask your doctor how to check your pulse. Check your pulse as told.  Lose weight if told by your doctor.  Stop smoking or chewing tobacco. Do not use gum or patches that help you quit without your doctor's approval.  Schedule and go to doctor visits as told.  Nonpregnant women should have no more than 1 drink a day. Men should have no more than 2 drinks a day. Talk to your doctor about drinking alcohol.  Stop illegal drug use.  Stay current with shots (immunizations).  Manage your health conditions as told by your  doctor.  Learn to manage your stress.  Rest when you are tired.  If it is really hot outside: ? Avoid intense activities. ? Use air conditioning or fans, or get in a cooler place. ? Avoid caffeine and alcohol. ? Wear loose-fitting, lightweight, and light-colored clothing.  If it is really cold outside: ? Avoid intense activities. ? Layer your clothing. ? Wear mittens or gloves, a hat, and a scarf when going outside. ? Avoid alcohol.  Learn about heart failure and get support as needed.  Get help to maintain or improve your quality of life and your ability to care for yourself as needed. Contact a doctor if:  You gain weight quickly.  You are more short of breath than usual.  You cannot do your normal activities.  You tire easily.  You cough more than normal, especially with activity.  You have any or more puffiness (swelling) in areas such as your hands, feet, ankles, or belly (abdomen).  You cannot sleep because it is hard to breathe.  You feel like your heart is beating fast (palpitations).  You get dizzy or light-headed when you stand up. Get help right away if:  You have trouble breathing.  There is a change in mental status, such as becoming less alert or not being able to focus.  You have chest pain or discomfort.  You faint. This information is not intended to replace advice given to you by your health care provider. Make sure you  discuss any questions you have with your health care provider. Document Released: 06/28/2008 Document Revised: 02/25/2016 Document Reviewed: 11/05/2012 Elsevier Interactive Patient Education  2017 [place].   Heart Failure Action Plan A heart failure action plan helps you understand what to do when you have symptoms of heart failure. Follow the plan that was created by you and your health care provider. Review your plan each time you visit your health care provider. Red zone These signs and symptoms mean you should get  medical help right away:  You have trouble breathing when resting.  You have a dry cough that is getting worse.  You have swelling or pain in your legs or abdomen that is getting worse.  You suddenly gain more than 2-3 lb (0.9-1.4 kg) in a day, or more than 5 lb (2.3 kg) in one week. This amount may be more or less depending on your condition.  You have trouble staying awake or you feel confused.  You have chest pain.  You do not have an appetite.  You pass out.  If you experience any of these symptoms:  Call your local emergency services (911 in the [place] right away or seek help at the emergency department of the nearest hospital.  Yellow zone These signs and symptoms mean your condition may be getting worse and you should make some changes:  You have trouble breathing when you are active or you need to sleep with extra pillows.  You have swelling in your legs or abdomen.  You gain 2-3 lb (0.9-1.4 kg) in one day, or 5 lb (2.3 kg) in one week. This amount may be more or less depending on your condition.  You get tired easily.  You have trouble sleeping.  You have a dry cough.  If you experience any of these symptoms:  Contact your health care provider within the next day.  Your health care provider may adjust your medicines.  Green zone These signs mean you are doing well and can continue what you are doing:  You do not have shortness of breath.  You have very little swelling or no new swelling.  Your weight is stable (no gain or loss).  You have a normal activity level.  You do not have chest pain or any other new symptoms.  Follow these instructions at home:  Take over-the-counter and prescription medicines only as told by your health care provider.  Weigh yourself daily. Your target weight is __________ lb (__________ kg). ? Call your health care provider if you gain more than __________ lb (__________ kg) in a day, or more than __________ lb  (__________ kg) in one week.  Eat a heart-healthy diet. Work with a diet and nutrition specialist (dietitian) to create an eating plan that is best for you.  Keep all follow-up visits as told by your health care provider. This is important. Where to find more information:  American Heart Association: www.heart.org Summary  Follow the action plan that was created by you and your health care provider.  Get help right away if you have any symptoms in the Red zone. This information is not intended to replace advice given to you by your health care provider. Make sure you discuss any questions you have with your health care provider. Document Released: 10/29/2016 Document Revised: 10/29/2016 Document Reviewed: 10/29/2016 Elsevier Interactive Patient Education  2018 [place].   Heart Failure Eating Plan Heart failure, also called congestive heart failure, occurs when your heart does not pump blood  well enough to meet your body's needs for oxygen-rich blood. Heart failure is a long-term (chronic) condition. Living with heart failure can be challenging. However, following your health care provider's instructions about a healthy lifestyle and working with a diet and nutrition specialist (dietitian) to choose the right foods may help to improve your symptoms. What are tips for following this plan? General guidelines  Do not eat more than 2,300 mg of salt (sodium) a day. The amount of sodium that is recommended for you may be lower, depending on your condition.  Maintain a healthy body weight as directed. Ask your health care provider what a healthy weight is for you. ? Check your weight every day. ? Work with your health care provider and dietitian to make a plan that is right for you to lose weight or maintain your current weight.  Limit how much fluid you drink. Ask your health care provider or dietitian how much fluid you can have each day.  Limit or avoid alcohol as told by your health  care provider or dietitian. Reading food labels  Check food labels for the amount of sodium per serving. Choose foods that have less than 140 mg (milligrams) of sodium in each serving.  Check food labels for the number of calories per serving. This is important if you need to limit your daily calorie intake to lose weight.  Check food labels for the serving size. If you eat more than one serving, you will be eating more sodium and calories than what is listed on the label.  Look for foods that are labeled as "sodium-free," "very low sodium," or "low sodium." ? Foods labeled as "reduced sodium" or "lightly salted" may still have more sodium than what is recommended for you. Cooking  Avoid adding salt when cooking. Ask your health care provider or dietitian before using salt substitutes.  Season food with salt-free seasonings, spices, or herbs. Check the label of seasoning mixes to make sure they do not contain salt.  Cook with heart-healthy oils, such as olive, canola, soybean, or sunflower oil.  Do not fry foods. Cook foods using low-fat methods, such as baking, boiling, grilling, and broiling.  Limit unhealthy fats when cooking by: ? Removing the skin from poultry, such as chicken. ? Removing all visible fats from meats. ? Skimming the fat off from stews, soups, and gravies before serving them. Meal planning  Limit your intake of: ? Processed, canned, or pre-packaged foods. ? Foods that are high in trans fat, such as fried foods. ? Sweets, desserts, sugary drinks, and other foods with added sugar. ? Full-fat dairy products, such as whole milk.  Eat a balanced diet that includes: ? 4-5 servings of fruit each day and 4-5 servings of vegetables each day. At each meal, try to fill half of your plate with fruits and vegetables. ? Up to 6-8 servings of whole grains each day. ? Up to 2 servings of lean meat, poultry, or fish each day. One serving of meat is equal to 3 oz. This is about  the same size as a deck of cards. ? 2 servings of low-fat dairy each day. ? Heart-healthy fats. Healthy fats called omega-3 fatty acids are found in foods such as flaxseed and cold-water fish like sardines, salmon, and mackerel.  Aim to eat 25-35 g (grams) of fiber a day. Foods that are high in fiber include apples, broccoli, carrots, beans, peas, and whole grains.  Do not add salt or condiments that contain salt (such  as soy sauce) to foods before eating.  When eating at a restaurant, ask that your food be prepared with less salt or no salt, if possible.  Try to eat 2 or more vegetarian meals each week.  Eat more home-cooked food and eat less restaurant, buffet, and fast food. Recommended foods The items listed may not be a complete list. Talk with your dietitian about what dietary choices are best for you. Grains Bread with less than 80 mg of sodium per slice. Whole-wheat pasta, quinoa, and brown rice. Oats and oatmeal. Barley. [place]. Grits and cream of wheat. Whole-grain and whole-wheat cold cereal. Vegetables All fresh vegetables. Vegetables that are frozen without sauce or added salt. Low-sodium or sodium-free canned vegetables. Fruits All fresh, frozen, and canned fruits. Dried fruits, such as raisins, prunes, and cranberries. Meats and other protein foods Lean cuts of meat. Skinless chicken and [place]. Fish with high omega-3 fatty acids, such as salmon, sardines, and other cold-water fishes. Eggs. Dried beans, peas, and edamame. Unsalted nuts and nut butters. Dairy Low-fat or nonfat (skim) milk and dried milk. Rice milk, soy milk, and almond milk. Low-fat or nonfat yogurt. Small amounts of reduced-sodium block cheese. Low-sodium cottage cheese. Fats and oils Olive, canola, soybean, flaxseed, or sunflower oil. Avocado. Sweets and desserts Apple sauce. Granola bars. Sugar-free pudding and gelatin. Frozen fruit bars. Seasoning and other foods Fresh and dried herbs. Lemon or lime  juice. Vinegar. Low-sodium ketchup. Salt-free marinades, salad dressings, sauces, and seasonings. Foods to avoid The items listed may not be a complete list. Talk with your dietitian about what dietary choices are best for you. Grains Bread with more than 80 mg of sodium per slice. Hot or cold cereal with more than 140 mg sodium per serving. Salted pretzels and crackers. Pre-packaged breadcrumbs. Bagels, croissants, and biscuits. Vegetables Canned vegetables. Frozen vegetables with sauce or seasonings. Creamed vegetables. [place] fries. Onion rings. Pickled vegetables and sauerkraut. Fruits Fruits that are dried with sodium-containing preservatives. Meats and other protein foods Ribs and chicken wings. Bacon, ham, pepperoni, bologna, salami, and packaged luncheon meats. Hot dogs, bratwurst, and sausage. Canned meat. Smoked meat and fish. Salted nuts and seeds. Dairy Whole milk, half-and-half, and cream. Buttermilk. Processed cheese, cheese spreads, and cheese curds. Regular cottage cheese. Feta cheese. Shredded cheese. String cheese. Fats and oils Butter, lard, shortening, ghee, and bacon fat. Canned and packaged gravies. Seasoning and other foods Onion salt, garlic salt, table salt, and sea salt. Marinades. Regular salad dressings. Relishes, pickles, and olives. Meat flavorings and tenderizers, and bouillon cubes. Horseradish, ketchup, and mustard. Worcestershire sauce. Teriyaki sauce, soy sauce (including reduced sodium). Hot sauce and Tabasco sauce. Steak sauce, fish sauce, oyster sauce, and cocktail sauce. Taco seasonings. Barbecue sauce. Tartar sauce. Summary  A heart failure eating plan includes changes that limit your intake of sodium and unhealthy fat, and it may help you lose weight or maintain a healthy weight. Your health care provider may also recommend limiting how much fluid you drink.  Most people with heart failure should eat no more than 2,300 mg of salt (sodium) a day. The  amount of sodium that is recommended for you may be lower, depending on your condition.  Contact your health care provider or dietitian before making any major changes to your diet. This information is not intended to replace advice given to you by your health care provider. Make sure you discuss any questions you have with your health care provider. Document Released: 02/03/2017 Document Revised: 02/03/2017 Document Reviewed:  02/03/2017 Elsevier Interactive Patient Education  2018 [place].   Heart Failure Exacerbation  Heart failure is a condition in which the heart does not fill up with enough blood, and therefore does not pump enough blood and oxygen to the body. When this happens, parts of the body do not get the blood and oxygen they need to function properly. This can cause symptoms such as breathing problems, fatigue, swelling, and confusion. Heart failure exacerbation refers to heart failure symptoms that get worse. The symptoms may get worse suddenly or develop slowly over time. Heart failure exacerbation is a serious medical problem that should be treated right away. What are the causes? A heart failure exacerbation can be triggered by:  Not taking your heart failure medicines correctly.  Infections.  Eating an unhealthy diet or a diet that is high in salt (sodium).  Drinking too much fluid.  Drinking alcohol.  Taking illegal drugs, such as cocaine or methamphetamine.  Not exercising.  Other causes include:  Other heart conditions such as an irregular heartbeat (arrhythmia).  Anemia.  Other medical problems, such as kidney failure.  Sometimes the cause of the exacerbation is not known. What are the signs or symptoms? When heart failure symptoms suddenly or slowly get worse, this may be a sign of heart failure exacerbation. Symptoms of heart failure include:  Breathing problems or shortness of breath.  Chronic coughing or wheezing.  Fatigue.  Nausea or  lack of appetite.  Feeling light-headed.  Confusion or memory loss.  Increased heart rate or irregular heartbeat.  Buildup of fluid in the legs, ankles, feet, or abdomen.  Difficulty breathing when lying down.  How is this diagnosed? This condition is diagnosed based on:  Your symptoms and medical history.  A physical exam.  You may also have tests, including:  Electrocardiogram (ECG). This test measures the electrical activity of your heart.  Echocardiogram. This test uses sound waves to take a picture of your heart to see how well it works.  Blood tests.  Imaging tests, such as: ? Chest X-ray. ? MRI. ? Ultrasound.  Stress test. This test examines how well your heart functions when you exercise. Your heart is monitored while you exercise on a treadmill or exercise bike. If you cannot exercise, medicines may be used to increase your heartbeat in place of exercise.  Cardiac catheterization. During this test, a thin, flexible tube (catheter) is inserted into a blood vessel and threaded up to your heart. This test allows your health care provider to check the arteries that lead to your heart (coronary arteries).  Right heart catheterization. During this test, the pressure in your heart is measured.  How is this treated? This condition may be treated by:  Adjusting your heart medicines.  Maintaining a healthy lifestyle. This includes: ? Eating a heart-healthy diet that is low in sodium. ? Not using any products that contain nicotine or tobacco, such as cigarettes and e-cigarettes. ? Regular exercise. ? Monitoring your fluid intake. ? Monitoring your weight and reporting changes to your health care provider.  Treating sleep apnea, if you have this condition.  Surgery. This may include: ? Implanting a device that helps both sides of your heart contract at the same time (cardiac resynchronization therapy device). This can help with heart function and relieve heart  failure symptoms. ? Implanting a device that can correct heart rhythm problems (implantable cardioverter defibrillator). ? Connecting a device to your heart to help it pump blood (ventricular assist device). ? Heart transplant.  Follow these instructions at home: Medicines  Take over-the-counter and prescription medicines only as told by your health care provider.  Do not stop taking your medicines or change the amount you take. If you are having problems or side effects from your medicines, talk to your health care provider.  If you are having difficulty paying for your medicines, contact a social worker or your clinic. There are many programs to assist with medicine costs.  Talk to your health care provider before starting any new medicines or supplements.  Make sure your health care provider and pharmacist have a list of all the medicines you are taking. Eating and drinking  Avoid drinking alcohol.  Eat a heart-healthy diet as told by your health care provider. This includes: ? Plenty of fruits and vegetables. ? Lean proteins. ? Low-fat dairy. ? Whole grains. ? Foods that are low in sodium. Activity  Exercise regularly as told by your health care provider. Balance exercise with rest.  Ask your health care provider what activities are safe for you. This includes sexual activity, exercise, and daily tasks at home or work. Lifestyle  Do not use any products that contain nicotine or tobacco, such as cigarettes and e-cigarettes. If you need help quitting, ask your health care provider.  Maintain a healthy weight. Ask your health care provider what weight is healthy for you.  Consider joining a patient support group. This can help with emotional problems you may have, such as stress and anxiety. General instructions  Talk to your health care provider about flu and pneumonia vaccines.  Keep a list of medicines that you are taking. This may help in emergency situations.  Keep  all follow-up visits as told by your health care provider. This is important. Contact a health care provider if:  You have questions about your medicines or you miss a dose.  You feel anxious, depressed, or stressed.  You have swelling in your feet, ankles, legs, or abdomen.  You have shortness of breath during activity or exercise.  You have a cough.  You have a fever.  You have trouble sleeping.  You gain 2-3 lb (1-1.4 kg) in 24 hours or 5 lb (2.3 kg) in a week. Get help right away if:  You have chest pain.  You have shortness of breath while resting.  You have severe fatigue.  You are confused.  You have severe dizziness.  You have a rapid or irregular heartbeat.  You have nausea or you vomit.  You have a cough that is worse at night or you cannot lie flat.  You have a cough that will not go away.  You have severe depression or sadness. Summary  When heart failure symptoms get worse, it is called heart failure exacerbation.  Common causes of this condition include taking medicines incorrectly, infections, and drinking alcohol.  This condition may be treated by adjusting medicines, maintaining a healthy lifestyle, or surgery.  Do not stop taking your medicines or change the amount you take. If you are having problems or side effects from your medicines, talk to your health care provider. This information is not intended to replace advice given to you by your health care provider. Make sure you discuss any questions you have with your health care provider. Document Released: 01/31/2017 Document Revised: 01/31/2017 Document Reviewed: 01/31/2017 Elsevier Interactive Patient Education  2018 [place].   Heart Failure and Exercise Heart failure is a condition in which the heart does not fill or pump enough blood  and oxygen to support your body and its functions. Heart failure is a long-term (chronic) condition. Living with heart failure can be challenging.  However, following your health care provider's instructions about a healthy lifestyle may help improve your symptoms. This includes choosing the right exercise plan. Doing daily physical activity is important after a diagnosis of heart failure. You may have some activity restrictions, so talk to your health care provider before doing any exercises. What are the benefits of exercise? Exercise may:  Make your heart muscles stronger.  Lower your blood pressure.  Lower your cholesterol.  Help you lose weight.  Help your bones stay strong.  Improve your blood circulation.  Help your body use oxygen better. This relieves symptoms such as fatigue and shortness of breath.  Help your mental health by lowering the risk of depression and other problems.  Improve your quality of life.  Decrease your chance of hospital admission for heart failure.  What is an exercise plan? An exercise plan is a set of specific exercises and training activities. You will work with your health care provider to create the exercise plan that works for you. The plan may include:  Different types of exercises and how to do them.  Cardiac rehabilitation exercises. These are supervised programs that are designed to strengthen your heart.  What are strengthening exercises? Strengthening exercises are a type of physical activity that involves using resistance to improve your muscle strength. Strengthening exercises usually have repetitive motions. These types of exercises can include:  Lifting weights.  Using weight machines.  Using resistance tubes and bands.  Using kettlebells.  Using your body weight, such as doing push-ups or squats.  What are balance exercises? Balance exercises are another type of physical activity. They strengthen the muscles of the back, stomach, and pelvis (core muscles) and improve your balance. They can also lower your risk of falling. These types of exercises can  include:  Standing on one leg.  Walking backward, sideways, and in a straight line.  Standing up after sitting, without using your hands.  Shifting your weight from one leg to the other.  Lifting one leg in front of you.  Doing tai chi. This is a type of exercise that uses slow movements and deep breathing.  How can I increase my flexibility? Having better flexibility can keep you from falling. It can also lengthen your muscles, improve your range of motion, and help your joints. You can increase your flexibility by:  Doing tai chi.  Doing yoga.  Stretching.  How much aerobic exercise should I get? Aerobic exercises strengthen your breathing and circulation system and increase your body's use of oxygen. Examples of aerobic exercise include biking, walking, running, and swimming. Talk to your health care provider to find out how much aerobic exercise is safe for you.  To do these exercises:  Start exercising slowly, limiting the amount of time at first. You may need to start with 5 minutes of aerobic exercise every day.  Slowly add more minutes until you can safely do at least 30 minutes of exercise at least 4 days a week.  Summary  Daily physical activity is important after a diagnosis of heart failure.  Exercise can make your heart muscles stronger. It also offers other benefits that will improve your health.  Talk to your health care provider before doing any exercises. This information is not intended to replace advice given to you by your health care provider. Make sure you discuss any questions  you have with your health care provider. Document Released: 01/31/2017 Document Revised: 01/31/2017 Document Reviewed: 01/31/2017 Elsevier Interactive Patient Education  2018 [employer].

## 2018-09-15 NOTE — Discharge Summary (Signed)
Discharge Summary  Geoffrey Benjamin OFB:[id number] DOB: 12-02-1940  PCP: Heywood Bene, PA-C  Admit date: 09/13/2018 Discharge date: 09/15/2018  Time spent: 35 minutes  Recommendations for Outpatient Follow-up:  1. Follow-up with cardiology 2. Follow-up with your PCP 3. Take your medications as prescribed 4. Abstain from tobacco use  Discharge Diagnoses:  Active Hospital Problems   Diagnosis Date Noted  . CAP (community acquired pneumonia) 09/13/2018  . Hyperlipidemia 09/13/2018  . Volume overload 09/13/2018  . Tobacco use 09/13/2018  . Aortic stenosis 09/13/2018  . GERD 06/04/2009  . Type 2 diabetes mellitus ([place]) 09/22/2008    Resolved Hospital Problems  No resolved problems to display.    Discharge Condition: Stable  Diet recommendation: Heart healthy diet  Vitals:   09/14/18 1949 09/15/18 0541  BP: 125/79 120/75  Pulse: 92 85  Resp: 18 18  Temp: 97.9 F (36.6 C) (!) 97.4 F (36.3 C)  SpO2: 95% 98%    History of present illness:  Geoffrey Benjamin a 77 y.o.malewith medical history significant oftype 2 diabetes, hyperlipidemia, history of unspecified tachycardia, aortic stenosis who was coming to the emergency department with complaints of progressively worsening dyspnea of 5 weeks duration.  Associated with dry cough and fatigue. He has been having orthopnea.  Admitted for suspected heart failure versus community-acquired pneumonia.  Pneumonia ruled out.  Patient diagnosed with new acute systolic CHF.  2D echo done on 09/14/2018 revealed severely reduced LVEF of 30 to 35% with diffuse hypokinesis complicated by moderate to severe increased pulmonary arteries with peak pressure of 58 mmhg and moderate aortic stenosis.  Cardiology Dr Terrence Dupont consulted and followed.  Recommendations for cardiac catheterization on Monday, 09/17/2018 in order to determine whether or not the patient will need CABG versus AVR.  Patient wants to go home.  Per cardiology patient will  follow-up outpatient in 1 to 2 weeks.  Patient was seen and examined at his bedside with his wife present.  He denies any chest pain however still has intermittent dyspnea even at rest.  Suspect secondary to symptomatic severe aortic stenosis.  Lengthy conversation with the patient and his wife regarding importance of compliance.  Patient still makes the decision to go home.  States he will leave in any circumstances.     Hospital Course:  Principal Problem:   CAP (community acquired pneumonia) Active Problems:   Type 2 diabetes mellitus (HCC)   GERD   Hyperlipidemia   Volume overload   Tobacco use   Aortic stenosis  Newly diagnosed acute systolic CHF 2D echo done on 10/01/2018 revealed severely reduced LVEF of 30 to 35% with diffuse hypokinesis, moderate to severe increase pulmonary arteries and moderate aortic stenosis. Cardiology consulted and followed.  Recommendations for left heart cath but patient wants to go home LDL 75, A1c 6.0, TSH 2.4 Twelve-lead EKG done on admission revealed sinus rhythm with no specific ST-T changes Continue to closely monitor on telemetry Continue strict I's and O's and daily weight Follow-up with cardiology in 1 to 2 weeks  Symptomatic severe aortic stenosis Cardiology recommended left heart cath to decide whether the patient needs CABG versus TAVR Patient wants to go home Follow-up with cardiology outpatient in 1 to 2 weeks  Pulmonary edema, suspect cardiogenic Independent review chest x-ray done on admission which revealed cardiomegaly with increase in pulmonary vascularity suggestive of pulmonary edema Had IV Lasix 40 mg daily  Community-acquired pneumonia, ruled out Procalcitonin less than 0.10 Afebrile with no leukocytosis No productive cough No sign of active infective  process  Type 2 diabetes On glimepiride and metformin at home Hold glimepiride and resume metformin Hemoglobin A1c 5.9 Avoid  hypoglycemia  Hypertension Continue home antihypertensive medication On metoprolol tartrate 25 mg twice daily and lisinopril 2.5 mg daily Added IV Lasix due to necessity for diuresis while inpatient  GERD Continue Protonix  Hyperlipidemia LDL 75 Continue pravastatin  Current tobacco user, 40+ years Tobacco cessation counseling done at bedside Nicotine patch provided Patient shows no interest in quitting tobacco use   Code Status:Full code. Family Communication: Updated his wife at bedside.  All questions answered to her satisfaction.. Consults called: Cardiology   Discharge Exam: BP 120/75 (BP Location: Left Arm)   Pulse 85   Temp (!) 97.4 F (36.3 C) (Oral)   Resp 18   Ht 6' (1.829 m)   Wt 78.7 kg Comment: scale b  SpO2 98%   BMI 23.52 kg/m  . General: 77 y.o. year-old male well developed well nourished in no acute distress.  Alert and oriented x3. . Cardiovascular: Regular rate and rhythm with no rubs or gallops.  No thyromegaly or JVD noted.  Grade 4 out of 6 systolic murmur radiating to his carotids. Marland Kitchen Respiratory: Mild rales at bases with no wheezes. Good inspiratory effort. . Abdomen: Soft nontender nondistended with normal bowel sounds x4 quadrants. . Musculoskeletal: No lower extremity edema. 2/4 pulses in all 4 extremities. . Skin: No ulcerative lesions noted or rashes, . Psychiatry: Mood is appropriate for condition and setting  Discharge Instructions You were cared for by a hospitalist during your hospital stay. If you have any questions about your discharge medications or the care you received while you were in the hospital after you are discharged, you can call the unit and asked to speak with the hospitalist on call if the hospitalist that took care of you is not available. Once you are discharged, your primary care physician will handle any further medical issues. Please note that NO REFILLS for any discharge medications will be authorized once  you are discharged, as it is imperative that you return to your primary care physician (or establish a relationship with a primary care physician if you do not have one) for your aftercare needs so that they can reassess your need for medications and monitor your lab values.   Allergies as of 09/15/2018   No Known Allergies     Medication List    STOP taking these medications   glimepiride 4 MG tablet Commonly known as:  AMARYL     TAKE these medications   aspirin EC 81 MG tablet Take 81 mg by mouth daily.   Fish Oil 1200 MG Caps Take 1,200 mg by mouth daily.   furosemide 40 MG tablet Commonly known as:  LASIX Take 1 tablet (40 mg total) by mouth daily.   lisinopril 2.5 MG tablet Commonly known as:  PRINIVIL,ZESTRIL Take 1 tablet (2.5 mg total) by mouth daily. Start taking on:  September 16, 2018   metFORMIN 1000 MG tablet Commonly known as:  GLUCOPHAGE Take 1 tablet (1,000 mg total) by mouth 2 (two) times daily with a meal.   metoprolol tartrate 25 MG tablet Commonly known as:  LOPRESSOR Take 1 tablet (25 mg total) by mouth 2 (two) times daily.   nicotine 21 mg/24hr patch Commonly known as:  NICODERM CQ - dosed in mg/24 hours Place 1 patch (21 mg total) onto the skin daily.   pantoprazole 40 MG tablet Commonly known as:  PROTONIX Take 1 tablet (  40 mg total) by mouth daily. Start taking on:  September 16, 2018   potassium chloride SA 20 MEQ tablet Commonly known as:  K-DUR,KLOR-CON Take 1 tablet (20 mEq total) by mouth daily.   pravastatin 40 MG tablet Commonly known as:  PRAVACHOL Take 1 tablet (40 mg total) by mouth daily.      No Known Allergies Follow-up Information    Heywood Bene, PA-C. Call in 1 day(s).   Specialty:  Physician Assistant Why:  Please call for post hospital follow-up appointment. Contact information: [address] [place] [address] [phone]        Charolette Forward, MD. Call in 1 day(s).   Specialty:   Cardiology Why:  Please call for a post hospital follow-up appointment in 1 to 2 weeks. Contact information: [address] [place] [address] [phone]            The results of significant diagnostics from this hospitalization (including imaging, microbiology, ancillary and laboratory) are listed below for reference.    Significant Diagnostic Studies: Dg Chest 2 View  Result Date: 09/13/2018 CLINICAL DATA:  Shortness of Breath EXAM: CHEST - 2 VIEW COMPARISON:  September 12, 2018 chest radiograph and chest CT December 25, 2008 FINDINGS: There is persistent cardiomegaly with pulmonary venous hypertension. There is interstitial edema throughout the lungs bilaterally. There are areas of patchy airspace opacity in the right upper and left lower lobe regions. There is a small right pleural effusion. There is aortic atherosclerosis. No adenopathy. There is degenerative change in the lower thoracic spine. IMPRESSION: Pulmonary vascular congestion with interstitial edema and small right pleural effusion. Suspect a degree of congestive heart failure. Patchy airspace opacity in the right upper lobe and left base regions may represent alveolar edema or patchy multifocal pneumonia. Suspect pneumonia superimposed on a degree of congestive heart failure. There is aortic atherosclerosis. No evident adenopathy. Aortic Atherosclerosis (ICD10-I70.0). Electronically Signed   By: Lowella Grip III M.D.   On: 09/13/2018 11:55   Dg Chest 2 View  Result Date: 09/12/2018 CLINICAL DATA:  Worsening shortness of breath over 2 weeks EXAM: CHEST - 2 VIEW COMPARISON:  11/20/2008 FINDINGS: Cardiac shadow is enlarged. Aortic calcifications are noted. Mild increased central vascular congestion is noted. Some patchy opacities are noted within both lungs which may represent acute infiltrate although there chronicity could not be determined. No effusion is seen. No bony abnormality is noted. IMPRESSION:  Mild vascular congestion. Patchy opacities in both lungs which given a lack of prior imaging must be considered as acute multifocal pneumonia. Follow-up following appropriate therapy is recommended in 3-4 weeks. Electronically Signed   By: Inez Catalina M.D.   On: 09/12/2018 16:26    Microbiology: No results found for this or any previous visit (from the past 240 hour(s)).   Labs: Basic Metabolic Panel: Recent Labs  Lab 09/13/18 1131 09/13/18 1534 09/14/18 0703 09/15/18 0607  NA 137  --  137 135  K 4.1  --  4.1 4.9  CL 98  --  95* 97*  CO2 23  --  28 23  GLUCOSE 201*  --  126* 110*  BUN 9  --  10 15  CREATININE 0.96  --  1.08 1.02  CALCIUM 9.1  --  9.2 9.0  MG  --  1.7  --  2.0  PHOS  --  3.7  --  4.1   Liver Function Tests: No results for input(s): AST, ALT, ALKPHOS, BILITOT, PROT,  ALBUMIN in the last 168 hours. No results for input(s): LIPASE, AMYLASE in the last 168 hours. No results for input(s): AMMONIA in the last 168 hours. CBC: Recent Labs  Lab 09/13/18 1131 09/14/18 0703 09/15/18 0607  WBC 8.6 6.8 6.9  NEUTROABS 6.7 4.6 4.8  HGB 15.0 14.3 14.3  HCT 46.7 44.9 43.8  MCV 95.1 94.7 93.2  PLT 278 295 260   Cardiac Enzymes: No results for input(s): CKTOTAL, CKMB, CKMBINDEX, TROPONINI in the last 168 hours. BNP: BNP (last 3 results) Recent Labs    09/13/18 1121  BNP 927.4*    ProBNP (last 3 results) No results for input(s): PROBNP in the last 8760 hours.  CBG: Recent Labs  Lab 09/14/18 0902 09/14/18 1223 09/14/18 1657 09/14/18 2107 09/15/18 0737  GLUCAP 136* 163* 87 100* 127*       Signed:  Kayleen Memos, MD [facility] 09/15/2018, 12:05 PM

## 2018-09-19 DIAGNOSIS — I5021 Acute systolic (congestive) heart failure: Secondary | ICD-10-CM | POA: Insufficient documentation

## 2018-09-20 ENCOUNTER — Telehealth: Payer: Self-pay

## 2018-09-20 NOTE — Telephone Encounter (Signed)
SENT REFERRAL TO SCHEDULING FILED NOTES

## 2018-10-10 NOTE — Progress Notes (Signed)
Cardiology Office Note   Date:  10/12/2018   ID:  [name], DOB Dec 16, 1940, MRN [id number]  PCP:  Heywood Bene, PA-C  Cardiologist:   Jenkins Rouge, MD   No chief complaint on file.     History of Present Illness: Geoffrey Benjamin is a 78 y.o. male who presents for consultation regarding dyspnea and an abnormal ECG.  He was hospitalized 09/13/18 with CHF. Seen by Dr Terrence Dupont TTE noted EF 30-35% diffuse hypokinesis with likely severe AS mean gradient 32 mmHg AVA .28 DVI .14 and peak gradient 54 mmHg  He has DM-2, HLD SOB worse over 5 weeks BNP was 927 CXR vascular congestion and ? Multifocal pneumonia Diuresed and improved Was supposed to f/u with Dr Terrence Dupont to arrange cath  Still smoking a ppd has tried nicorette gum and patches in past with no luck   Long discussion with him about diagnosis and need for cath and likely AVR/CABG He does feel better on current medication but still with exertional dyspnea.   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 Knew about his heart murmur and saw Dr Verlon Setting with [facility] years ago but ignored problems  Past Medical History:  Diagnosis Date  . Aortic stenosis 09/13/2018  . CAP (community acquired pneumonia) 09/13/2018  . DM (diabetes mellitus) ([place])   . GERD 06/04/2009   Qualifier: Diagnosis of  By: Henrene Pastor MD, Docia Chuck   . Hyperlipidemia 09/13/2018  . Loss of weight 09/29/2008   Qualifier: Diagnosis of  By: Bobby Rumpf CMA (AAMA), Patty    . PERSONAL HX COLONIC POLYPS 06/04/2009   Qualifier: Diagnosis of  By: Henrene Pastor MD, [place] PAIN 09/29/2008   Qualifier: Diagnosis of  By: Bobby Rumpf CMA (AAMA), Patty    . Tachycardia   . Tobacco use 09/13/2018  . Volume overload 09/13/2018    Past Surgical History:  Procedure Laterality Date  . DOPPLER ECHOCARDIOGRAPHY     Mitral inflow and tissue doppler consistent with impaired LV relaxation; Trileaflet aortic vlave with moderate aortic valve stenosis, Trivial mitral and tricuspid valve  regurgitation.     Current Outpatient Medications  Medication Sig Dispense Refill  . aspirin EC 81 MG tablet Take 81 mg by mouth daily.    . furosemide (LASIX) 40 MG tablet Take 1 tablet (40 mg total) by mouth daily. 90 tablet 3  . lisinopril (PRINIVIL,ZESTRIL) 2.5 MG tablet Take 1 tablet (2.5 mg total) by mouth daily. 90 tablet 3  . metFORMIN (GLUCOPHAGE) 1000 MG tablet Take 1 tablet (1,000 mg total) by mouth 2 (two) times daily with a meal. 30 tablet 0  . metoprolol tartrate (LOPRESSOR) 25 MG tablet Take 1 tablet (25 mg total) by mouth 2 (two) times daily. 180 tablet 3  . nicotine (NICODERM CQ - DOSED IN MG/24 HOURS) 21 mg/24hr patch Place 1 patch (21 mg total) onto the skin daily. 28 patch 0  . Omega-3 Fatty Acids (FISH OIL) 1200 MG CAPS Take 1,200 mg by mouth daily.    . pantoprazole (PROTONIX) 40 MG tablet Take 1 tablet (40 mg total) by mouth daily. 30 tablet 0  . potassium chloride SA (K-DUR,KLOR-CON) 20 MEQ tablet Take 1 tablet (20 mEq total) by mouth daily. 90 tablet 3  . pravastatin (PRAVACHOL) 40 MG tablet Take 1 tablet (40 mg total) by mouth daily. 90 tablet 3   No current facility-administered medications for this visit.     Allergies:   Patient has no known allergies.  Social History:  The patient  reports that he has been smoking cigarettes. He has been smoking about 2.00 packs per day. He has never used smokeless tobacco. He reports current alcohol use. He reports that he does not use drugs.   Family History:  The patient's family history includes Kidney disease in his father; Stroke in his mother.    ROS:  Please see the history of present illness.   Otherwise, review of systems are positive for none.   All other systems are reviewed and negative.    PHYSICAL EXAM: VS:  BP 120/62   Pulse 69   Ht 6' (1.829 m)   Wt 176 lb 12 oz (80.2 kg)   SpO2 97%   BMI 23.97 kg/m  , BMI Body mass index is 23.97 kg/m. Affect appropriate Healthy:  appears stated age 4:  normal Neck supple with no adenopathy JVP normal no bruits no thyromegaly Lungs clear with no wheezing and good diaphragmatic motion Heart:  S1/S2 no murmur, no rub, gallop or click PMI normal Abdomen: benighn, BS positve, no tenderness, no AAA no bruit.  No HSM or HJR Distal pulses intact with no bruits No edema Neuro non-focal Skin warm and dry No muscular weakness    EKG:  09/15/18  SR [id number] LVH    Recent Labs: 09/13/2018: B Natriuretic Peptide 927.4 09/15/2018: BUN 15; Creatinine, Ser 1.02; Hemoglobin 14.3; Magnesium 2.0; Platelets 260; Potassium 4.9; Sodium 135; TSH 2.479    Lipid Panel    Component Value Date/Time   CHOL 128 09/15/2018 0607   TRIG 114 09/15/2018 0607   HDL 30 (L) 09/15/2018 0607   CHOLHDL 4.3 09/15/2018 0607   VLDL 23 09/15/2018 0607   LDLCALC 75 09/15/2018 0607      Wt Readings from Last 3 Encounters:  10/12/18 176 lb 12 oz (80.2 kg)  09/15/18 173 lb 6.4 oz (78.7 kg)      Other studies Reviewed: Additional studies/ records that were reviewed today include: Notes from hospital admission , Cardiology consult Harwani, labs, CXR and TTE.    ASSESSMENT AND PLAN:  1.  Aortic Stenosis:  Severe low gradient low EF. Long discussion again about need for right and left cath and referral to CVTS for surgery. Risks including stroke, MI, need for emergency CABG , contrast reaction and bleeding discussed he will think about it and if not I will see him back in 6-8 weeks to see how he is doing  2. 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  Right heart cath with angiogram 3. DM:  Discussed low carb diet.  Target hemoglobin A1c is 6.5 or less.  Continue current medications. 4. HLD:  Continue statin    Current medicines are reviewed at length with the patient today.  The patient does not have concerns regarding medicines.  The following changes have been made:  no change  Labs/ tests ordered today  include: Pre cath  No orders of the defined types were placed in this encounter.    Disposition:   FU with CVTS post cath      Signed, Jenkins Rouge, MD  10/12/2018 9:00 AM    [address], [place], [place]  [address] Phone: [phone]; Fax: [phone]

## 2018-10-12 ENCOUNTER — Ambulatory Visit (INDEPENDENT_AMBULATORY_CARE_PROVIDER_SITE_OTHER): Payer: Medicare Other | Admitting: Cardiovascular Disease

## 2018-10-12 ENCOUNTER — Encounter: Payer: Self-pay | Admitting: Cardiovascular Disease

## 2018-10-12 VITALS — BP 120/62 | HR 69 | Ht 72.0 in | Wt 176.8 lb

## 2018-10-12 DIAGNOSIS — E785 Hyperlipidemia, unspecified: Secondary | ICD-10-CM

## 2018-10-12 DIAGNOSIS — I509 Heart failure, unspecified: Secondary | ICD-10-CM

## 2018-10-12 DIAGNOSIS — I350 Nonrheumatic aortic (valve) stenosis: Secondary | ICD-10-CM | POA: Diagnosis not present

## 2018-10-12 MED ORDER — PRAVASTATIN SODIUM 40 MG PO TABS: 40.0000 mg | ORAL_TABLET | Freq: Every day | ORAL | 3 refills | Status: DC

## 2018-10-12 MED ORDER — METOPROLOL TARTRATE 25 MG PO TABS: 25.0000 mg | ORAL_TABLET | Freq: Two times a day (BID) | ORAL | 3 refills | Status: DC

## 2018-10-12 MED ORDER — POTASSIUM CHLORIDE CRYS ER 20 MEQ PO TBCR: 20.0000 meq | EXTENDED_RELEASE_TABLET | Freq: Every day | ORAL | 3 refills | Status: DC

## 2018-10-12 MED ORDER — LISINOPRIL 2.5 MG PO TABS: 2.5000 mg | ORAL_TABLET | Freq: Every day | ORAL | 3 refills | Status: DC

## 2018-10-12 MED ORDER — FUROSEMIDE 40 MG PO TABS: 40.0000 mg | ORAL_TABLET | Freq: Every day | ORAL | 3 refills | Status: DC

## 2018-10-12 NOTE — Patient Instructions (Addendum)
Medication Instructions:   If you need a refill on your cardiac medications before your next appointment, please call your pharmacy.   Lab work:  If you have labs (blood work) drawn today and your tests are completely normal, you will receive your results only by: Marland Kitchen MyChart Message (if you have MyChart) OR . A paper copy in the mail If you have any lab test that is abnormal or we need to change your treatment, we will call you to review the results.  Testing/Procedures: Please give our office a call [phone] to set you up for a heart cath.  Follow-Up: At [facility], you and your health needs are our priority.  As part of our continuing mission to provide you with exceptional heart care, we have created designated Provider Care Teams.  These Care Teams include your primary Cardiologist (physician) and Advanced Practice Providers (APPs -  Physician Assistants and Nurse Practitioners) who all work together to provide you with the care you need, when you need it. You will need a follow up appointment in 2 months.  You may see Dr. Johnsie Cancel or one of the following Advanced Practice Providers on your designated Care Team:   Truitt Merle, NP Cecilie Kicks, NP . Kathyrn Drown, NP

## 2018-10-16 ENCOUNTER — Other Ambulatory Visit: Payer: Self-pay | Admitting: Cardiovascular Disease

## 2018-10-16 MED ORDER — PANTOPRAZOLE SODIUM 40 MG PO TBEC: 40.0000 mg | DELAYED_RELEASE_TABLET | Freq: Every day | ORAL | 11 refills | Status: DC

## 2018-10-16 NOTE — Telephone Encounter (Signed)
Pt's medication was sent to pt's pharmacy as requested. Confirmation received.  °

## 2018-12-06 ENCOUNTER — Telehealth: Payer: Self-pay | Admitting: Cardiovascular Disease

## 2018-12-06 ENCOUNTER — Other Ambulatory Visit: Payer: Self-pay | Admitting: Cardiovascular Disease

## 2018-12-06 NOTE — Telephone Encounter (Signed)
Patient and his wife called wanting to schedule heart cath for aortic stenosis. It has been over 30 days since patient was last seen. Patient has OV with Dr. Johnsie Cancel on 12/21/18. Encouraged patient to keep appt on 12/21/18 and will schedule heart cath with Dr. Angelena Form on 12/24/18. Will get lab work and give patient instructions at office visit on 12/21/18.

## 2018-12-06 NOTE — Telephone Encounter (Signed)
New Message   Pts wife is calling because she said she needs to speak to [facility] about setting up a heart cath appt for the Pt   Please call back

## 2018-12-06 NOTE — Telephone Encounter (Signed)
That's fine

## 2018-12-19 NOTE — Progress Notes (Signed)
Cardiology Office Note   Date:  12/20/2018   ID:  Geoffrey Benjamin, DOB Nov 16, 1940, MRN [id number]  PCP:  Heywood Bene, PA-C  Cardiologist:   Jenkins Rouge, MD   No chief complaint on file.     History of Present Illness:  78 y.o. first seen 10/12/18 for dyspnea and abnormal ECG.   He was hospitalized 09/13/18 with CHF. Seen by Dr Terrence Dupont TTE noted EF 30-35% diffuse hypokinesis with likely severe AS mean gradient 32 mmHg AVA .61 DVI .14 and peak gradient 54 mmHg  He has DM-2, HLD SOB worse over 5 weeks BNP was 927 CXR vascular congestion and ? Multifocal pneumonia Diuresed and improved Was supposed to f/u with Dr Terrence Dupont to arrange cath  Still smoking a ppd has tried nicorette gum and patches in past with no luck   Long discussion with him about diagnosis and need for cath and likely AVR/CABG He does feel better on current medication but still with exertional dyspnea.   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 Knew about his heart murmur and saw Dr Verlon Setting with [facility] years ago but ignored problems  Cath has been scheduled for 12/24/18 with DR [facility] Risks including stroke , bleeding, contrast reaction and need for emergency surgery discussed and patient Is willing to proceed.   Strongly suspect he will need CVTS consult post cath for CABG/AVR  Past Medical History:  Diagnosis Date  . Aortic stenosis 09/13/2018  . CAP (community acquired pneumonia) 09/13/2018  . DM (diabetes mellitus) ([place])   . GERD 06/04/2009   Qualifier: Diagnosis of  By: Henrene Pastor MD, Docia Chuck   . Hyperlipidemia 09/13/2018  . Loss of weight 09/29/2008   Qualifier: Diagnosis of  By: Bobby Rumpf CMA (AAMA), Patty    . PERSONAL HX COLONIC POLYPS 06/04/2009   Qualifier: Diagnosis of  By: Henrene Pastor MD, [place] PAIN 09/29/2008   Qualifier: Diagnosis of  By: Bobby Rumpf CMA (AAMA), Patty    . Tachycardia   . Tobacco use 09/13/2018  . Volume overload 09/13/2018    Past Surgical History:  Procedure  Laterality Date  . DOPPLER ECHOCARDIOGRAPHY     Mitral inflow and tissue doppler consistent with impaired LV relaxation; Trileaflet aortic vlave with moderate aortic valve stenosis, Trivial mitral and tricuspid valve regurgitation.     Current Outpatient Medications  Medication Sig Dispense Refill  . aspirin EC 81 MG tablet Take 81 mg by mouth daily.    . furosemide (LASIX) 40 MG tablet Take 1 tablet (40 mg total) by mouth daily. 90 tablet 3  . glipiZIDE (GLUCOTROL) 5 MG tablet Take 5 mg by mouth daily before breakfast.    . lisinopril (PRINIVIL,ZESTRIL) 2.5 MG tablet Take 1 tablet (2.5 mg total) by mouth daily. 90 tablet 3  . metFORMIN (GLUCOPHAGE) 1000 MG tablet Take 1 tablet (1,000 mg total) by mouth 2 (two) times daily with a meal. 30 tablet 0  . metoprolol tartrate (LOPRESSOR) 25 MG tablet Take 1 tablet (25 mg total) by mouth 2 (two) times daily. 180 tablet 3  . Omega-3 Fatty Acids (FISH OIL) 1200 MG CAPS Take 1,200 mg by mouth daily.    . pantoprazole (PROTONIX) 40 MG tablet Take 1 tablet (40 mg total) by mouth daily. 30 tablet 11  . potassium chloride SA (K-DUR,KLOR-CON) 20 MEQ tablet Take 1 tablet (20 mEq total) by mouth daily. 90 tablet 3  . pravastatin (PRAVACHOL) 40 MG tablet Take 1 tablet (40  mg total) by mouth daily. 90 tablet 3   No current facility-administered medications for this visit.     Allergies:   Patient has no known allergies.    Social History:  The patient  reports that he has been smoking cigarettes. He has been smoking about 2.00 packs per day. He has never used smokeless tobacco. He reports current alcohol use. He reports that he does not use drugs.   Family History:  The patient's family history includes Kidney disease in his father; Stroke in his mother.    ROS:  Please see the history of present illness.   Otherwise, review of systems are positive for none.   All other systems are reviewed and negative.    PHYSICAL EXAM: VS:  BP 114/70   Pulse 81    Ht 6' (1.829 m)   Wt 78.7 kg   SpO2 98%   BMI 23.54 kg/m  , BMI Body mass index is 23.54 kg/m. Affect appropriate Healthy:  appears stated age 76: normal Neck supple with no adenopathy JVP normal no bruits no thyromegaly Lungs clear with no wheezing and good diaphragmatic motion Heart:  S1/S2 no murmur, no rub, gallop or click PMI normal Abdomen: benighn, BS positve, no tenderness, no AAA no bruit.  No HSM or HJR Distal pulses intact with no bruits No edema Neuro non-focal Skin warm and dry No muscular weakness    EKG:  09/15/18  SR ICRBBB LVH    Recent Labs: 09/13/2018: B Natriuretic Peptide 927.4 09/15/2018: BUN 15; Creatinine, Ser 1.02; Hemoglobin 14.3; Magnesium 2.0; Platelets 260; Potassium 4.9; Sodium 135; TSH 2.479    Lipid Panel    Component Value Date/Time   CHOL 128 09/15/2018 0607   TRIG 114 09/15/2018 0607   HDL 30 (L) 09/15/2018 0607   CHOLHDL 4.3 09/15/2018 0607   VLDL 23 09/15/2018 0607   LDLCALC 75 09/15/2018 0607      Wt Readings from Last 3 Encounters:  12/20/18 78.7 kg  10/12/18 80.2 kg  09/15/18 78.7 kg      Other studies Reviewed: Additional studies/ records that were reviewed today include: Notes from hospital admission , Cardiology consult Harwani, labs, CXR and TTE.    ASSESSMENT AND PLAN:  1.  Aortic Stenosis:  Severe low gradient low EF. Long discussion again about need for right and left cath and referral to CVTS for surgery. Risks including stroke, MI, need for emergency CABG , contrast reaction and bleeding discussed Scheduled For 12/24/18 with CM Orders done lab called  2. 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  Right heart cath with angiogram 3. DM:  Discussed low carb diet.  Target hemoglobin A1c is 6.5 or less.  Continue current medications. 4. HLD:  Continue statin    Current medicines are reviewed at length with the patient today.  The patient does not have  concerns regarding medicines.  The following changes have been made:  no change  Labs/ tests ordered today include: Pre cath   Orders Placed This Encounter  Procedures  . Basic metabolic panel  . CBC  . EKG 12-Lead     Disposition:   FU with CVTS post cath    Time spent explaining diagnosis of CHF, severe AS and risks of cath as well as arranging Cath and orders 45 minutes  Signed, Jenkins Rouge, MD  12/20/2018 2:23 PM    [facility] [place],  , [place]  [address] Phone: [phone]; Fax: [phone]  

## 2018-12-20 ENCOUNTER — Ambulatory Visit (INDEPENDENT_AMBULATORY_CARE_PROVIDER_SITE_OTHER): Payer: Medicare Other | Admitting: Cardiovascular Disease

## 2018-12-20 ENCOUNTER — Encounter: Payer: Self-pay | Admitting: Cardiovascular Disease

## 2018-12-20 VITALS — BP 114/70 | HR 81 | Ht 72.0 in | Wt 173.6 lb

## 2018-12-20 DIAGNOSIS — I509 Heart failure, unspecified: Secondary | ICD-10-CM

## 2018-12-20 DIAGNOSIS — E785 Hyperlipidemia, unspecified: Secondary | ICD-10-CM | POA: Diagnosis not present

## 2018-12-20 DIAGNOSIS — I350 Nonrheumatic aortic (valve) stenosis: Secondary | ICD-10-CM | POA: Diagnosis not present

## 2018-12-20 NOTE — Patient Instructions (Signed)
Medication Instructions:   If you need a refill on your cardiac medications before your next appointment, please call your pharmacy.   Lab work: Your physician recommends that you have lab work today- BMET and CBC  If you have labs (blood work) drawn today and your tests are completely normal, you will receive your results only by: Marland Kitchen MyChart Message (if you have MyChart) OR . A paper copy in the mail If you have any lab test that is abnormal or we need to change your treatment, we will call you to review the results.  Testing/Procedures: Your physician has requested that you have a cardiac catheterization. Cardiac catheterization is used to diagnose and/or treat various heart conditions. Doctors may recommend this procedure for a number of different reasons. The most common reason is to evaluate chest pain. Chest pain can be a symptom of coronary artery disease (CAD), and cardiac catheterization can show whether plaque is narrowing or blocking your heart's arteries. This procedure is also used to evaluate the valves, as well as measure the blood flow and oxygen levels in different parts of your heart. For further information please visit [email or web address][facility][email or web address]. Please follow instruction sheet, as given.  Follow-Up: At [facility], you and your health needs are our priority.  As part of our continuing mission to provide you with exceptional heart care, we have created designated Provider Care Teams.  These Care Teams include your primary Cardiologist (physician) and Advanced Practice Providers (APPs -  Physician Assistants and Nurse Practitioners) who all work together to provide you with the care you need, when you need it. You will need a follow up appointment in 3 weeks.  Please call our office 2 months in advance to schedule this appointment.  You may see Jenkins Rouge, MD or one of the following Advanced Practice Providers on your designated Care Team:   Truitt Merle, NP Cecilie Kicks,  NP . Kathyrn Drown, NP

## 2018-12-21 ENCOUNTER — Telehealth: Payer: Self-pay | Admitting: Cardiovascular Disease

## 2018-12-21 ENCOUNTER — Ambulatory Visit: Payer: Medicare Other | Admitting: Cardiovascular Disease

## 2018-12-21 NOTE — Telephone Encounter (Signed)
Patient called again wanting to know if the procedure that is scheduled for Monday is going to happen. Please call back.  If not home please let a detailed message.

## 2018-12-21 NOTE — Telephone Encounter (Signed)
Spoke with Cath Lab and confirmed this patient's case is still on for Monday.  Attempted to call patient to confirm and to ask COVID-19 screening questions.  Left message to call back.

## 2018-12-21 NOTE — Telephone Encounter (Signed)
° ° °  Patient calling to discuss procedure scheduled heart cath for 3/23

## 2018-12-24 ENCOUNTER — Encounter (HOSPITAL_COMMUNITY): Admission: RE | Payer: Self-pay | Source: Home / Self Care

## 2018-12-24 ENCOUNTER — Ambulatory Visit (HOSPITAL_COMMUNITY): Admission: RE | Admit: 2018-12-24 | Payer: Medicare Other | Source: Home / Self Care | Admitting: Cardiovascular Disease

## 2018-12-24 SURGERY — RIGHT/LEFT HEART CATH AND CORONARY ANGIOGRAPHY
Anesthesia: LOCAL

## 2018-12-25 NOTE — Telephone Encounter (Signed)
Patient needs cath schedule within 3-4 weeks will likely need AVR/CABG do not delay longer

## 2018-12-25 NOTE — Telephone Encounter (Signed)
Patient has been rescheduled for April 17th with Dr. Angelena Form. Will mail patient's instructions to him.

## 2018-12-25 NOTE — Telephone Encounter (Signed)
Patient's cath was canceled. Will forward to Dr. Johnsie Cancel for further advisement.

## 2018-12-25 NOTE — Telephone Encounter (Signed)
I have printed cath letter instructions and have mailed them out to the pt. Howie Ill, RN has gone over instructions with pt by phone.

## 2019-01-17 ENCOUNTER — Telehealth: Payer: Self-pay | Admitting: *Deleted

## 2019-01-17 NOTE — Telephone Encounter (Signed)
Pt contacted pre-catheterization scheduled at [facility] for: Friday April 17,2020 7:30 AM  I spoke with patient and patient's wife-pt requests to cancel [facility] scheduled for 01/18/19. Pt states he is concerned about Covid-19 pandemic risks. Also, he wants to wait until his wife can accompany him to the hospital when he has procedure, under current [place] visitor restrictions due to Covid-19 pandemic, she would not be able to be with him.  Pt did not want to reschedule [facility] at this time. I offered to arrange Virtual Visit follow up with Dr Johnsie Cancel, pt declined and is aware he will need an office visit within 30 days of [facility].  Pt states he will call when he is ready to schedule follow up with Dr Tania Ade.  Pt advised I will forward to Dr Johnsie Cancel for review.

## 2019-01-18 ENCOUNTER — Encounter (HOSPITAL_COMMUNITY): Admission: RE | Payer: Self-pay | Source: Home / Self Care

## 2019-01-18 ENCOUNTER — Ambulatory Visit (HOSPITAL_COMMUNITY): Admission: RE | Admit: 2019-01-18 | Payer: Medicare Other | Source: Home / Self Care | Admitting: Cardiovascular Disease

## 2019-01-18 SURGERY — RIGHT/LEFT HEART CATH AND CORONARY ANGIOGRAPHY
Anesthesia: LOCAL

## 2019-03-11 ENCOUNTER — Telehealth: Payer: Self-pay | Admitting: *Deleted

## 2019-03-11 NOTE — Telephone Encounter (Signed)
See phone note 01/17/19.  I  left message at home number listed (DPR)  for patient to call back  to discuss rescheduling R/LHC that was cancelled 01/18/19.

## 2019-03-21 NOTE — Telephone Encounter (Signed)
I called patient to discuss rescheduling R/LHC that was cancelled 01/18/19, received voicemail message at home phone number listed.

## 2019-04-22 NOTE — Telephone Encounter (Signed)
I called patient to discuss rescheduling R/LHC originally scheduled 01/18/19, I received voicemail at home number listed.  I left message for pt to call me to discuss rescheduling [facility] and to make him aware that starting 04/22/19 [place] is  allowing one support person to accompany patients to hospital for procedures.

## 2019-05-06 ENCOUNTER — Other Ambulatory Visit (HOSPITAL_COMMUNITY): Payer: Medicare Other

## 2019-08-27 ENCOUNTER — Encounter: Payer: Self-pay | Admitting: *Deleted

## 2019-08-27 ENCOUNTER — Other Ambulatory Visit: Payer: Self-pay

## 2019-08-27 ENCOUNTER — Ambulatory Visit (INDEPENDENT_AMBULATORY_CARE_PROVIDER_SITE_OTHER): Payer: Medicare Other | Admitting: Diagnostic Neuroimaging

## 2019-08-27 VITALS — BP 100/66 | HR 106 | Temp 97.6°F | Ht 72.0 in | Wt 163.0 lb

## 2019-08-27 DIAGNOSIS — I639 Cerebral infarction, unspecified: Secondary | ICD-10-CM

## 2019-08-27 DIAGNOSIS — R29898 Other symptoms and signs involving the musculoskeletal system: Secondary | ICD-10-CM | POA: Diagnosis not present

## 2019-08-27 DIAGNOSIS — R269 Unspecified abnormalities of gait and mobility: Secondary | ICD-10-CM

## 2019-08-27 NOTE — Progress Notes (Signed)
[facility]  PATIENT: Geoffrey Benjamin DOB: March 06, 1941  REFERRING CLINICIAN: B Williams HISTORY FROM: patient and wide  REASON FOR VISIT: new consult    HISTORICAL  CHIEF COMPLAINT:  Chief Complaint  Patient presents with  . Walking difficulty, hand weakness    rm 6 New Pt wife- [name] "since Nov 6th I walk slowly, I am unsteady, no falls; right arm/hand weak, I drop things"     HISTORY OF PRESENT ILLNESS:   78 year old male with hypertension, hypercholesterolemia, diabetes, congestive heart failure, aortic stenosis, tobacco abuse, here for evaluation of new onset right hand weakness and gait difficulty.  August 09, 2019 patient was at home when all of a sudden he had right hand cramping and weakness.  He ran warm water over his hand and symptoms slightly improved.  Symptoms did not resolve.  Over the next day he noticed weakness in his lower extremities.  He was having difficulty with balance or walking.  Difficulty with coordination and dexterity of the right hand.  No slurred speech or trouble talking.  No swallowing difficulties.  No vision changes.  Patient went to PCP for evaluation and was referred here.  Of note patient was diagnosed with CHF in December 2019 with ejection fraction 30 to 35% and severe aortic stenosis.  He was recommended to have cardiac catheterization and then followed up with possible aortic valve replacement and CABG procedure.  This was scheduled for March - April 2020 but postponed at patient's request due to his concern of COVID-19 pandemic risk.     REVIEW OF SYSTEMS: Full 14 system review of systems performed and negative with exception of: As per HPI.  ALLERGIES: No Known Allergies  HOME MEDICATIONS: Outpatient Medications Prior to Visit  Medication Sig Dispense Refill  . aspirin EC 81 MG tablet Take 81 mg by mouth daily.    Marland Kitchen atorvastatin (LIPITOR) 80 MG tablet Take 80 mg by mouth daily.    . furosemide (LASIX) 40 MG  tablet Take 1 tablet (40 mg total) by mouth daily. 90 tablet 3  . glipiZIDE (GLUCOTROL) 5 MG tablet Take 5 mg by mouth daily before breakfast.    . lisinopril (PRINIVIL,ZESTRIL) 2.5 MG tablet Take 1 tablet (2.5 mg total) by mouth daily. 90 tablet 3  . metFORMIN (GLUCOPHAGE) 1000 MG tablet Take 1 tablet (1,000 mg total) by mouth 2 (two) times daily with a meal. 30 tablet 0  . metoprolol tartrate (LOPRESSOR) 25 MG tablet Take 1 tablet (25 mg total) by mouth 2 (two) times daily. 180 tablet 3  . mirtazapine (REMERON) 15 MG tablet Take 15 mg by mouth at bedtime.    . Omega-3 Fatty Acids (FISH OIL) 1200 MG CAPS Take 1,200 mg by mouth daily.    . pantoprazole (PROTONIX) 40 MG tablet Take 1 tablet (40 mg total) by mouth daily. 30 tablet 11  . potassium chloride SA (K-DUR,KLOR-CON) 20 MEQ tablet Take 1 tablet (20 mEq total) by mouth daily. 90 tablet 3  . pravastatin (PRAVACHOL) 40 MG tablet Take 1 tablet (40 mg total) by mouth daily. 90 tablet 3   No facility-administered medications prior to visit.     PAST MEDICAL HISTORY: Past Medical History:  Diagnosis Date  . Aortic stenosis 09/13/2018  . CAP (community acquired pneumonia) 09/13/2018  . CHF (congestive heart failure) ([place])   . Difficulty walking   . DM (diabetes mellitus) ([place])   . GERD 06/04/2009   Qualifier: Diagnosis of  By: Henrene Pastor MD, Docia Chuck   .  Hyperlipidemia 09/13/2018  . Hypertension   . Loss of weight 09/29/2008   Qualifier: Diagnosis of  By: Bobby Rumpf CMA (AAMA), Patty    . PERSONAL HX COLONIC POLYPS 06/04/2009   Qualifier: Diagnosis of  By: Henrene Pastor MD, [place] PAIN 09/29/2008   Qualifier: Diagnosis of  By: Bobby Rumpf CMA (AAMA), Patty    . Tachycardia   . Tobacco use 09/13/2018  . Volume overload 09/13/2018    PAST SURGICAL HISTORY: Past Surgical History:  Procedure Laterality Date  . CATARACT EXTRACTION, BILATERAL  08/2016  . DOPPLER ECHOCARDIOGRAPHY     Mitral inflow and tissue doppler consistent with impaired LV relaxation;  Trileaflet aortic vlave with moderate aortic valve stenosis, Trivial mitral and tricuspid valve regurgitation.    FAMILY HISTORY: Family History  Problem Relation Age of Onset  . Kidney disease Father   . Stroke Mother     SOCIAL HISTORY: Social History   Socioeconomic History  . Marital status: Married    Spouse name: [name]  . Number of children: 3  . Years of education: Not on file  . Highest education level: 9th grade  Occupational History  . Occupation: RETIRED    Comment: Nurse, learning disability  . Financial resource strain: Not on file  . Food insecurity    Worry: Not on file    Inability: Not on file  . Transportation needs    Medical: Not on file    Non-medical: Not on file  Tobacco Use  . Smoking status: Current Every Day Smoker    Packs/day: 1.50    Types: Cigarettes  . Smokeless tobacco: Never Used  . Tobacco comment: SMOKED FOR 60 PLUS YEARS  Substance and Sexual Activity  . Alcohol use: Yes    Comment: occas  . Drug use: Never  . Sexual activity: Not on file  Lifestyle  . Physical activity    Days per week: Not on file    Minutes per session: Not on file  . Stress: Not on file  Relationships  . Social Herbalist on phone: Not on file    Gets together: Not on file    Attends religious service: Not on file    Active member of club or organization: Not on file    Attends meetings of clubs or organizations: Not on file    Relationship status: Not on file  . Intimate partner violence    Fear of current or ex partner: Not on file    Emotionally abused: Not on file    Physically abused: Not on file    Forced sexual activity: Not on file  Other Topics Concern  . Not on file  Social History Narrative   Lives with wife   Caffeine-coffee 6-7 c daily     PHYSICAL EXAM  GENERAL EXAM/CONSTITUTIONAL: Vitals:  Vitals:   08/27/19 1544  BP: 100/66  Pulse: (!) 106  Temp: 97.6 F (36.4 C)  Weight: 163 lb (73.9 kg)  Height: 6'  (1.829 m)     Body mass index is 22.11 kg/m. Wt Readings from Last 3 Encounters:  08/27/19 163 lb (73.9 kg)  12/20/18 173 lb 9.6 oz (78.7 kg)  10/12/18 176 lb 12 oz (80.2 kg)     Patient is in no distress; well developed, nourished and groomed; neck is supple  CARDIOVASCULAR:  Examination of carotid arteries is normal; no carotid bruits  Regular rate and rhythm, no murmurs  Examination of peripheral vascular system  by observation and palpation is normal  EYES:  Ophthalmoscopic exam of optic discs and posterior segments is normal; no papilledema or hemorrhages  No exam data present  MUSCULOSKELETAL:  Gait, strength, tone, movements noted in Neurologic exam below  NEUROLOGIC: MENTAL STATUS:  No flowsheet data found.  awake, alert, oriented to person, place and time  recent and remote memory intact  normal attention and concentration  language fluent, comprehension intact, naming intact  fund of knowledge appropriate  CRANIAL NERVE:   2nd - no papilledema on fundoscopic exam  2nd, 3rd, 4th, 6th - pupils equal and reactive to light, visual fields full to confrontation, extraocular muscles intact, no nystagmus  5th - facial sensation symmetric  7th - facial strength symmetric  8th - hearing intact  9th - palate elevates symmetrically, uvula midline  11th - shoulder shrug symmetric  12th - tongue protrusion midline  HOARSE VOICE  MOTOR:   RUE 3-4; SLOW ON RIGHT HAND; RIGHT ARM DRIFT  LUE 4  BLE 4+  SENSORY:   normal and symmetric to light touch, temperature, vibration; DECR IN FEET   COORDINATION:   finger-nose-finger, fine finger movements --> SLOW ON RIGHT HAND  REFLEXES:   deep tendon reflexes TRACE and symmetric  GAIT/STATION:   narrow based gait; UNSTEADY GAIT     DIAGNOSTIC DATA (LABS, IMAGING, TESTING) - I reviewed patient records, labs, notes, testing and imaging myself where available.  Lab Results  Component Value  Date   WBC 6.9 09/15/2018   HGB 14.3 09/15/2018   HCT 43.8 09/15/2018   MCV 93.2 09/15/2018   PLT 260 09/15/2018      Component Value Date/Time   NA 135 09/15/2018 0607   K 4.9 09/15/2018 0607   CL 97 (L) 09/15/2018 0607   CO2 23 09/15/2018 0607   GLUCOSE 110 (H) 09/15/2018 0607   BUN 15 09/15/2018 0607   CREATININE 1.02 09/15/2018 0607   CALCIUM 9.0 09/15/2018 0607   PROT 6.9 12/18/2008 1042   ALBUMIN 4.3 12/18/2008 1042   AST 10 12/18/2008 1042   ALT 9 12/18/2008 1042   ALKPHOS 53 12/18/2008 1042   BILITOT 0.6 12/18/2008 1042   GFRNONAA >60 09/15/2018 0607   GFRAA >60 09/15/2018 0607   Lab Results  Component Value Date   CHOL 128 09/15/2018   HDL 30 (L) 09/15/2018   LDLCALC 75 09/15/2018   TRIG 114 09/15/2018   CHOLHDL 4.3 09/15/2018   Lab Results  Component Value Date   HGBA1C 6.0 (H) 09/15/2018   No results found for: [id number] Lab Results  Component Value Date   TSH 2.479 09/15/2018     09/14/18 TTE - The estimated ejection fraction was in the range of 30% to 35%. Diffuse hypokinesis.  - Severely reduced LV EF. Aortic valve is thickened and restricted,   with moderate AR. There is at least moderate aortic stenosis, but   given valve area this is likely low flow-low gradient severe   aortic stenosis. Recommend heart failure and/or cardiology valve   team assessment if not already pursued.    ASSESSMENT AND PLAN  78 y.o. year old male here with new onset right hand weakness and incoordination, gait difficulty, concerning for new onset stroke.  Stroke risk factors include low ejection fraction 30 to 35%, aortic stenosis, hypertension, diabetes, hypercholesterolemia, tobacco abuse.  Dx:  1. Right hand weakness   2. Gait difficulty      PLAN:  RIGHT HAND WEAKNESS / LOWER EXTREMITY WEAKNESS / GAIT DIFF -  check MRI brain, MRA head / neck - follow up with cardiology re: low EF and aortic stenosis - refer to PT / OT for eval and treatment -  continue aspirin, statin, BP and DM control per PCP - stroke precautions reviewed  Orders Placed This Encounter  Procedures  . MR BRAIN WO CONTRAST  . MR ANGIO HEAD WO CONTRAST  . MR ANGIO NECK W WO CONTRAST  . Ambulatory referral to Physical Therapy  . Ambulatory referral to Occupational Therapy   Return pending test results, for pending if symptoms worsen or fail to improve. follow up after test results.     Penni Bombard, MD 40/35/2481, 8:59 PM Certified in Neurology, Neurophysiology and Neuroimaging  [facility] [address], [place], [place] [address] [phone]

## 2019-08-27 NOTE — Patient Instructions (Addendum)
RIGHT HAND WEAKNESS / LOWER EXTREMITY WEAKNESS / GAIT DIFF - check MRI brain, MRA head / neck - follow up with cardiology re: low EF and aortic stenosis - refer to PT / OT for eval and treatment - continue aspirin, statin, BP and DM control per PCP - stroke precautions reviewed

## 2019-09-01 ENCOUNTER — Emergency Department (HOSPITAL_COMMUNITY): Payer: Medicare Other

## 2019-09-01 ENCOUNTER — Encounter (HOSPITAL_COMMUNITY): Payer: Self-pay

## 2019-09-01 ENCOUNTER — Inpatient Hospital Stay (HOSPITAL_COMMUNITY): Payer: Medicare Other

## 2019-09-01 ENCOUNTER — Other Ambulatory Visit: Payer: Self-pay

## 2019-09-01 ENCOUNTER — Inpatient Hospital Stay (HOSPITAL_COMMUNITY)
Admission: EM | Admit: 2019-09-01 | Discharge: 2019-09-04 | DRG: 040 | Disposition: A | Discharge status: Home Health | Payer: Medicare Other | Attending: Internal Medicine | Admitting: Internal Medicine

## 2019-09-01 DIAGNOSIS — R29810 Facial weakness: Secondary | ICD-10-CM | POA: Diagnosis present

## 2019-09-01 DIAGNOSIS — I5022 Chronic systolic (congestive) heart failure: Secondary | ICD-10-CM | POA: Diagnosis present

## 2019-09-01 DIAGNOSIS — I700 Atherosclerosis of aorta: Secondary | ICD-10-CM | POA: Diagnosis present

## 2019-09-01 DIAGNOSIS — C7931 Secondary malignant neoplasm of brain: Principal | ICD-10-CM | POA: Diagnosis present

## 2019-09-01 DIAGNOSIS — F17210 Nicotine dependence, cigarettes, uncomplicated: Secondary | ICD-10-CM | POA: Diagnosis present

## 2019-09-01 DIAGNOSIS — G40009 Localization-related (focal) (partial) idiopathic epilepsy and epileptic syndromes with seizures of localized onset, not intractable, without status epilepticus: Secondary | ICD-10-CM | POA: Diagnosis not present

## 2019-09-01 DIAGNOSIS — G936 Cerebral edema: Secondary | ICD-10-CM | POA: Diagnosis present

## 2019-09-01 DIAGNOSIS — R590 Localized enlarged lymph nodes: Secondary | ICD-10-CM | POA: Diagnosis not present

## 2019-09-01 DIAGNOSIS — I110 Hypertensive heart disease with heart failure: Secondary | ICD-10-CM | POA: Diagnosis not present

## 2019-09-01 DIAGNOSIS — Z20828 Contact with and (suspected) exposure to other viral communicable diseases: Secondary | ICD-10-CM | POA: Diagnosis present

## 2019-09-01 DIAGNOSIS — Z841 Family history of disorders of kidney and ureter: Secondary | ICD-10-CM

## 2019-09-01 DIAGNOSIS — Z823 Family history of stroke: Secondary | ICD-10-CM

## 2019-09-01 DIAGNOSIS — R4701 Aphasia: Secondary | ICD-10-CM | POA: Diagnosis not present

## 2019-09-01 DIAGNOSIS — R569 Unspecified convulsions: Secondary | ICD-10-CM | POA: Diagnosis present

## 2019-09-01 DIAGNOSIS — Z7289 Other problems related to lifestyle: Secondary | ICD-10-CM | POA: Diagnosis not present

## 2019-09-01 DIAGNOSIS — J439 Emphysema, unspecified: Secondary | ICD-10-CM | POA: Diagnosis present

## 2019-09-01 DIAGNOSIS — I350 Nonrheumatic aortic (valve) stenosis: Secondary | ICD-10-CM | POA: Diagnosis present

## 2019-09-01 DIAGNOSIS — T380X5A Adverse effect of glucocorticoids and synthetic analogues, initial encounter: Secondary | ICD-10-CM | POA: Diagnosis not present

## 2019-09-01 DIAGNOSIS — R471 Dysarthria and anarthria: Secondary | ICD-10-CM | POA: Diagnosis present

## 2019-09-01 DIAGNOSIS — I509 Heart failure, unspecified: Secondary | ICD-10-CM | POA: Diagnosis not present

## 2019-09-01 DIAGNOSIS — G939 Disorder of brain, unspecified: Secondary | ICD-10-CM | POA: Diagnosis not present

## 2019-09-01 DIAGNOSIS — R531 Weakness: Secondary | ICD-10-CM | POA: Diagnosis present

## 2019-09-01 DIAGNOSIS — Z7982 Long term (current) use of aspirin: Secondary | ICD-10-CM

## 2019-09-01 DIAGNOSIS — C3431 Malignant neoplasm of lower lobe, right bronchus or lung: Secondary | ICD-10-CM | POA: Diagnosis present

## 2019-09-01 DIAGNOSIS — R011 Cardiac murmur, unspecified: Secondary | ICD-10-CM | POA: Diagnosis not present

## 2019-09-01 DIAGNOSIS — Z7984 Long term (current) use of oral hypoglycemic drugs: Secondary | ICD-10-CM

## 2019-09-01 DIAGNOSIS — G8191 Hemiplegia, unspecified affecting right dominant side: Secondary | ICD-10-CM | POA: Diagnosis present

## 2019-09-01 DIAGNOSIS — E785 Hyperlipidemia, unspecified: Secondary | ICD-10-CM | POA: Diagnosis present

## 2019-09-01 DIAGNOSIS — Z720 Tobacco use: Secondary | ICD-10-CM | POA: Diagnosis not present

## 2019-09-01 DIAGNOSIS — G9389 Other specified disorders of brain: Secondary | ICD-10-CM | POA: Diagnosis not present

## 2019-09-01 DIAGNOSIS — K219 Gastro-esophageal reflux disease without esophagitis: Secondary | ICD-10-CM | POA: Diagnosis present

## 2019-09-01 DIAGNOSIS — C719 Malignant neoplasm of brain, unspecified: Secondary | ICD-10-CM | POA: Diagnosis not present

## 2019-09-01 DIAGNOSIS — R918 Other nonspecific abnormal finding of lung field: Secondary | ICD-10-CM | POA: Diagnosis not present

## 2019-09-01 DIAGNOSIS — I714 Abdominal aortic aneurysm, without rupture: Secondary | ICD-10-CM | POA: Diagnosis present

## 2019-09-01 DIAGNOSIS — I7102 Dissection of abdominal aorta: Secondary | ICD-10-CM | POA: Diagnosis not present

## 2019-09-01 DIAGNOSIS — I5020 Unspecified systolic (congestive) heart failure: Secondary | ICD-10-CM | POA: Diagnosis not present

## 2019-09-01 DIAGNOSIS — E119 Type 2 diabetes mellitus without complications: Secondary | ICD-10-CM | POA: Diagnosis present

## 2019-09-01 DIAGNOSIS — D72829 Elevated white blood cell count, unspecified: Secondary | ICD-10-CM | POA: Diagnosis not present

## 2019-09-01 DIAGNOSIS — R630 Anorexia: Secondary | ICD-10-CM | POA: Diagnosis not present

## 2019-09-01 LAB — COMPREHENSIVE METABOLIC PANEL
ALT: 15 U/L (ref 0–44)
AST: 15 U/L (ref 15–41)
Albumin: 3 g/dL — ABNORMAL LOW (ref 3.5–5.0)
Alkaline Phosphatase: 51 U/L (ref 38–126)
Anion gap: 10 (ref 5–15)
BUN: 9 mg/dL (ref 8–23)
CO2: 24 mmol/L (ref 22–32)
Calcium: 8.6 mg/dL — ABNORMAL LOW (ref 8.9–10.3)
Chloride: 98 mmol/L (ref 98–111)
Creatinine, Ser: 1.05 mg/dL (ref 0.61–1.24)
GFR calc Af Amer: >60 mL/min (ref >60)
GFR calc non Af Amer: >60 mL/min (ref >60)
Glucose, Bld: 157 mg/dL — ABNORMAL HIGH (ref 70–99)
Potassium: 4.5 mmol/L (ref 3.5–5.1)
Sodium: 132 mmol/L — ABNORMAL LOW (ref 135–145)
Total Bilirubin: 0.5 mg/dL (ref 0.3–1.2)
Total Protein: 6.2 g/dL — ABNORMAL LOW (ref 6.5–8.1)

## 2019-09-01 LAB — CBG MONITORING, ED
Glucose-Capillary: 149 mg/dL — ABNORMAL HIGH (ref 70–99)
Glucose-Capillary: 111 mg/dL — ABNORMAL HIGH (ref 70–99)

## 2019-09-01 LAB — CBC
HCT: 34.7 % — ABNORMAL LOW (ref 39.0–52.0)
Hemoglobin: 11.8 g/dL — ABNORMAL LOW (ref 13.0–17.0)
MCH: 31.6 pg (ref 26.0–34.0)
MCHC: 34 g/dL (ref 30.0–36.0)
MCV: 93 fL (ref 80.0–100.0)
Platelets: 299 10*3/uL (ref 150–400)
RBC: 3.73 MIL/uL — ABNORMAL LOW (ref 4.22–5.81)
RDW: 12.1 % (ref 11.5–15.5)
WBC: 11.9 10*3/uL — ABNORMAL HIGH (ref 4.0–10.5)
nRBC: 0 % (ref 0.0–0.2)

## 2019-09-01 LAB — I-STAT CHEM 8, ED
BUN: 10 mg/dL (ref 8–23)
Calcium, Ion: 1.07 mmol/L — ABNORMAL LOW (ref 1.15–1.40)
Chloride: 96 mmol/L — ABNORMAL LOW (ref 98–111)
Creatinine, Ser: 1 mg/dL (ref 0.61–1.24)
Glucose, Bld: 152 mg/dL — ABNORMAL HIGH (ref 70–99)
HCT: 36 % — ABNORMAL LOW (ref 39.0–52.0)
Hemoglobin: 12.2 g/dL — ABNORMAL LOW (ref 13.0–17.0)
Potassium: 4.3 mmol/L (ref 3.5–5.1)
Sodium: 133 mmol/L — ABNORMAL LOW (ref 135–145)
TCO2: 27 mmol/L (ref 22–32)

## 2019-09-01 LAB — DIFFERENTIAL
Abs Immature Granulocytes: 0.07 10*3/uL (ref 0.00–0.07)
Basophils Absolute: 0.1 10*3/uL (ref 0.0–0.1)
Basophils Relative: 1 %
Eosinophils Absolute: 0.7 10*3/uL — ABNORMAL HIGH (ref 0.0–0.5)
Eosinophils Relative: 6 %
Immature Granulocytes: 1 %
Lymphocytes Relative: 12 %
Lymphs Abs: 1.4 10*3/uL (ref 0.7–4.0)
Monocytes Absolute: 0.9 10*3/uL (ref 0.1–1.0)
Monocytes Relative: 8 %
Neutro Abs: 8.7 10*3/uL — ABNORMAL HIGH (ref 1.7–7.7)
Neutrophils Relative %: 72 %

## 2019-09-01 LAB — HEMOGLOBIN A1C
Hgb A1c MFr Bld: 6.4 % — ABNORMAL HIGH (ref 4.8–5.6)
Mean Plasma Glucose: 136.98 mg/dL

## 2019-09-01 LAB — PROTIME-INR
INR: 1.1 (ref 0.8–1.2)
Prothrombin Time: 13.6 seconds (ref 11.4–15.2)

## 2019-09-01 LAB — SARS CORONAVIRUS 2 (TAT 6-24 HRS): SARS Coronavirus 2: NEGATIVE

## 2019-09-01 LAB — APTT: aPTT: 33 seconds (ref 24–36)

## 2019-09-01 LAB — GLUCOSE, CAPILLARY: Glucose-Capillary: 261 mg/dL — ABNORMAL HIGH (ref 70–99)

## 2019-09-01 MED ORDER — IOHEXOL 350 MG/ML SOLN
75.0000 mL | Freq: Once | INTRAVENOUS | Status: AC | PRN
  Administered 2019-09-01: 75 mL via INTRAVENOUS

## 2019-09-01 MED ORDER — MIRTAZAPINE 15 MG PO TABS
15.0000 mg | ORAL_TABLET | Freq: Every day | ORAL | Status: DC
  Administered 2019-09-01 – 2019-09-03 (×3): 15 mg via ORAL
  Filled 2019-09-01 (×4): qty 1

## 2019-09-01 MED ORDER — METOPROLOL TARTRATE 25 MG PO TABS
25.0000 mg | ORAL_TABLET | Freq: Two times a day (BID) | ORAL | Status: DC
  Administered 2019-09-01 – 2019-09-04 (×6): 25 mg via ORAL
  Filled 2019-09-01 (×6): qty 1

## 2019-09-01 MED ORDER — INSULIN ASPART 100 UNIT/ML ~~LOC~~ SOLN
0.0000 [IU] | Freq: Every day | SUBCUTANEOUS | Status: DC
  Administered 2019-09-01 – 2019-09-03 (×2): 3 [IU] via SUBCUTANEOUS

## 2019-09-01 MED ORDER — INSULIN ASPART 100 UNIT/ML ~~LOC~~ SOLN
0.0000 [IU] | Freq: Three times a day (TID) | SUBCUTANEOUS | Status: DC
  Administered 2019-09-02: 5 [IU] via SUBCUTANEOUS
  Administered 2019-09-03: 3 [IU] via SUBCUTANEOUS
  Administered 2019-09-03: 5 [IU] via SUBCUTANEOUS
  Administered 2019-09-03: 8 [IU] via SUBCUTANEOUS
  Administered 2019-09-04: 3 [IU] via SUBCUTANEOUS
  Administered 2019-09-04: 8 [IU] via SUBCUTANEOUS

## 2019-09-01 MED ORDER — LEVETIRACETAM IN NACL 1000 MG/100ML IV SOLN
1000.0000 mg | Freq: Once | INTRAVENOUS | Status: AC
  Administered 2019-09-01: 1000 mg via INTRAVENOUS
  Filled 2019-09-01: qty 100

## 2019-09-01 MED ORDER — GADOBUTROL 1 MMOL/ML IV SOLN
7.0000 mL | Freq: Once | INTRAVENOUS | Status: AC | PRN
  Administered 2019-09-01: 7 mL via INTRAVENOUS

## 2019-09-01 MED ORDER — SODIUM CHLORIDE 0.9% FLUSH
3.0000 mL | Freq: Once | INTRAVENOUS | Status: DC
Start: 2019-09-01 — End: 2019-09-04

## 2019-09-01 MED ORDER — NICOTINE 21 MG/24HR TD PT24
21.0000 mg | MEDICATED_PATCH | Freq: Every day | TRANSDERMAL | Status: DC
  Administered 2019-09-01 – 2019-09-04 (×4): 21 mg via TRANSDERMAL
  Filled 2019-09-01 (×4): qty 1

## 2019-09-01 MED ORDER — ASPIRIN EC 81 MG PO TBEC
81.0000 mg | DELAYED_RELEASE_TABLET | Freq: Every day | ORAL | Status: DC
  Administered 2019-09-02 – 2019-09-04 (×3): 81 mg via ORAL
  Filled 2019-09-01 (×3): qty 1

## 2019-09-01 MED ORDER — PANTOPRAZOLE SODIUM 40 MG PO TBEC
40.0000 mg | DELAYED_RELEASE_TABLET | Freq: Every day | ORAL | Status: DC
  Administered 2019-09-01 – 2019-09-04 (×4): 40 mg via ORAL
  Filled 2019-09-01 (×4): qty 1

## 2019-09-01 MED ORDER — LEVETIRACETAM 500 MG PO TABS
500.0000 mg | ORAL_TABLET | Freq: Two times a day (BID) | ORAL | Status: DC
  Administered 2019-09-01 – 2019-09-04 (×6): 500 mg via ORAL
  Filled 2019-09-01 (×7): qty 1

## 2019-09-01 MED ORDER — DEXAMETHASONE SODIUM PHOSPHATE 10 MG/ML IJ SOLN
10.0000 mg | Freq: Once | INTRAMUSCULAR | Status: AC
  Administered 2019-09-01: 10 mg via INTRAVENOUS
  Filled 2019-09-01: qty 1

## 2019-09-01 MED ORDER — DEXAMETHASONE SODIUM PHOSPHATE 10 MG/ML IJ SOLN
6.0000 mg | Freq: Two times a day (BID) | INTRAMUSCULAR | Status: AC
  Administered 2019-09-02 (×2): 6 mg via INTRAVENOUS
  Filled 2019-09-01 (×2): qty 1

## 2019-09-01 MED ORDER — ENOXAPARIN SODIUM 40 MG/0.4ML ~~LOC~~ SOLN
40.0000 mg | SUBCUTANEOUS | Status: DC
  Administered 2019-09-02 – 2019-09-03 (×2): 40 mg via SUBCUTANEOUS
  Filled 2019-09-01 (×2): qty 0.4

## 2019-09-01 MED ORDER — ATORVASTATIN CALCIUM 80 MG PO TABS
80.0000 mg | ORAL_TABLET | Freq: Every day | ORAL | Status: DC
  Administered 2019-09-02 – 2019-09-04 (×3): 80 mg via ORAL
  Filled 2019-09-01 (×3): qty 1

## 2019-09-01 NOTE — ED Notes (Signed)
Report attempted to floor, asked to call back in 5 minutes.

## 2019-09-01 NOTE — ED Notes (Signed)
Patient transported to MRI 

## 2019-09-01 NOTE — Consult Note (Addendum)
NEURO HOSPITALIST  CONSULT   Requesting Physician: Dr. Vanita Panda    Chief Complaint: slurred speech/ right facial droop  History obtained from:  EMS/  HPI:                                                                                                                                         JAMOL GINYARD is a 78 yo male with PMH  Tobacco use, HTN, HLD, DM, CHF who presented to [facility] ED with c/o of right facial droop/slurred speech and worsening right side weakness. Code stroke was later canceled.    Per EMS patient was at home with his wife when about 1100 am he had a sudden worsening of the right arm weakness and new symptoms of slurred speech and right facial droop.  On 11/6 patient complained of right arm weakness and trouble gripping things as well as unsteady gait and walking slow. He saw a neurologist on 11/24 for f/u of these symptoms. Patient endorses smoking 2 packs of cigarettes per day, drinking ETOH  But has not had any for the past 3-4 days. Denies any CP, SOB, vision changes or drug use.  Patient was able to remember that he has had some intermittent episodes of right arm twitching.  Attempted to call wife to obtain a better description of events of today as patient was unable to recall what happened today. There was no answer.   ED course:  CTH:  Extensive edema in the left posterior frontal and parietal lobes with vasogenic appearance suggesting an underlying mass.  CTA: 16 mm left parietal mass with extensive vasogenic edema. Mass is secondary to metastatic disease given adenopathy and right upper lobe nodules.  BP: 132/63    Date last known well: 08/31/2019 Time last known well: 1100 tPA Given: no;  NIHSS:9     Past Medical History:  Diagnosis Date  . Aortic stenosis 09/13/2018  . CAP (community acquired pneumonia) 09/13/2018  . CHF (congestive heart failure) ([place])   . Difficulty walking   . DM (diabetes mellitus)  ([place])   . GERD 06/04/2009   Qualifier: Diagnosis of  By: Henrene Pastor MD, Docia Chuck   . Hyperlipidemia 09/13/2018  . Hypertension   . Loss of weight 09/29/2008   Qualifier: Diagnosis of  By: Bobby Rumpf CMA (AAMA), Patty    . PERSONAL HX COLONIC POLYPS 06/04/2009   Qualifier: Diagnosis of  By: Henrene Pastor MD, [place] PAIN 09/29/2008   Qualifier: Diagnosis of  By: Bobby Rumpf CMA (AAMA), Patty    . Tachycardia   . Tobacco use 09/13/2018  . Volume overload 09/13/2018  Past Surgical History:  Procedure Laterality Date  . CATARACT EXTRACTION, BILATERAL  08/2016  . DOPPLER ECHOCARDIOGRAPHY     Mitral inflow and tissue doppler consistent with impaired LV relaxation; Trileaflet aortic vlave with moderate aortic valve stenosis, Trivial mitral and tricuspid valve regurgitation.    Family History  Problem Relation Age of Onset  . Kidney disease Father   . Stroke Mother        Social History:  reports that he has been smoking cigarettes. He has been smoking about 1.50 packs per day. He has never used smokeless tobacco. He reports current alcohol use. He reports that he does not use drugs.  Allergies: No Known Allergies  Medications:                                                                                                                           Current Facility-Administered Medications  Medication Dose Route Frequency Provider Last Rate Last Dose  . dexamethasone (DECADRON) injection 10 mg  10 mg Intravenous Once Greta Doom, MD      . levETIRAcetam (KEPPRA) IVPB 1000 mg/100 mL premix  1,000 mg Intravenous Once Greta Doom, MD      . levETIRAcetam (KEPPRA) tablet 500 mg  500 mg Oral BID Greta Doom, MD      . sodium chloride flush (NS) 0.9 % injection 3 mL  3 mL Intravenous Once Carmin Muskrat, MD       Current Outpatient Medications  Medication Sig Dispense Refill  . aspirin EC 81 MG tablet Take 81 mg by mouth daily.    Marland Kitchen atorvastatin (LIPITOR) 80 MG tablet Take  80 mg by mouth daily.    . furosemide (LASIX) 40 MG tablet Take 1 tablet (40 mg total) by mouth daily. 90 tablet 3  . glipiZIDE (GLUCOTROL) 5 MG tablet Take 5 mg by mouth daily before breakfast.    . lisinopril (PRINIVIL,ZESTRIL) 2.5 MG tablet Take 1 tablet (2.5 mg total) by mouth daily. 90 tablet 3  . metFORMIN (GLUCOPHAGE) 1000 MG tablet Take 1 tablet (1,000 mg total) by mouth 2 (two) times daily with a meal. 30 tablet 0  . metoprolol tartrate (LOPRESSOR) 25 MG tablet Take 1 tablet (25 mg total) by mouth 2 (two) times daily. 180 tablet 3  . mirtazapine (REMERON) 15 MG tablet Take 15 mg by mouth at bedtime.    . Omega-3 Fatty Acids (FISH OIL) 1200 MG CAPS Take 1,200 mg by mouth daily.    . pantoprazole (PROTONIX) 40 MG tablet Take 1 tablet (40 mg total) by mouth daily. 30 tablet 11  . potassium chloride SA (K-DUR,KLOR-CON) 20 MEQ tablet Take 1 tablet (20 mEq total) by mouth daily. 90 tablet 3     ROS:  ROS was performed and is negative except as noted in HPI   General Examination:                                                                                                      There were no vitals taken for this visit.  Physical Exam  Constitutional: Appears well-developed and well-nourished.  Psych: Affect appropriate to situation Eyes: Normal external eye and conjunctiva. HENT: Normocephalic, no lesions, without obvious abnormality.   Musculoskeletal-no joint tenderness, deformity or swelling Cardiovascular: Normal rate and regular rhythm.  Respiratory: Effort normal, non-labored breathing saturations WNL on RA GI: Soft.  No distension. There is no tenderness.  Skin: WDI  Neurological Examination Mental Status: Alert, oriented, thought content appropriate.  Speech fluent without evidence of aphasia. Dysarthria present. Able to follow  commands  without difficulty. Cranial Nerves: II:  Visual fields grossly normal,  III,IV, VI: ptosis not present, extra-ocular motions intact bilaterally, pupils equal, round, reactive to light and accommodation V,VII: smile symmetric, facial light touch sensation normal bilaterally VIII: hearing normal bilaterally IX,X: uvula rises midline XI: bilateral shoulder shrug XII: midline tongue extension Motor: Right : Upper extremity   3/5  Left:     Upper extremity   5/5  Lower extremity   4/5   Lower extremity   5/5 Tone and bulk:normal tone throughout; no atrophy noted Sensory: LT decreased on right arm, patient seems to neglect right arm with DSS. LT intact to BLE.  Deep Tendon Reflexes: 2+ and symmetric biceps and patella Plantars: Right: downgoing   Left: downgoing Cerebellar: normal finger-to-nose on left side,  Unable to perform with right side HTS unable to perform Gait: deferred   Lab Results: Basic Metabolic Panel: Recent Labs  Lab 09/01/19 1358  NA 133*  K 4.3  CL 96*  GLUCOSE 152*  BUN 10  CREATININE 1.00    CBC: Recent Labs  Lab 09/01/19 1348 09/01/19 1358  WBC 11.9*  --   NEUTROABS 8.7*  --   HGB 11.8* 12.2*  HCT 34.7* 36.0*  MCV 93.0  --   PLT 299  --      Imaging: Ct Code Stroke Cta Head W/wo Contrast  Result Date: 09/01/2019 CLINICAL DATA:  Slurred speech and facial droop EXAM: CT ANGIOGRAPHY HEAD AND NECK TECHNIQUE: Multidetector CT imaging of the head and neck was performed using the standard protocol during bolus administration of intravenous contrast. Multiplanar CT image reconstructions and MIPs were obtained to evaluate the vascular anatomy. Carotid stenosis measurements (when applicable) are obtained utilizing NASCET criteria, using the distal internal carotid diameter as the denominator. CONTRAST:  45mL OMNIPAQUE IOHEXOL 350 MG/ML SOLN COMPARISON:  None. FINDINGS: CTA NECK FINDINGS Aortic arch: Extensive atherosclerotic plaque which is irregular. Three  vessel branching. Right carotid system: Atheromatous wall thickening of the brachiocephalic to common carotid bifurcation. Atherosclerotic plaque at the bifurcation is moderate and mainly calcified. Right ICA origin stenosis measures 50%. Left carotid system: Atherosclerotic plaque mainly at the common carotid and ICA origins without flow limiting stenosis or ulceration. Vertebral arteries: Extensive proximal subclavian atherosclerosis without  flow limiting stenosis. Atherosclerotic plaque on the vertebral arteries on both sides without flow limiting stenosis or dissection. Skeleton: No acute or aggressive finding Other neck: Adenopathy in the bilateral supraclavicular fossae. Upper chest: Partially covered mediastinum shows bilateral adenopathy and multiple right-sided pulmonary nodules. Review of the MIP images confirms the above findings CTA HEAD FINDINGS Anterior circulation: Diffuse atherosclerotic plaque on the carotid siphons. No branch occlusion or flow limiting stenosis. Negative for aneurysm Posterior circulation: Atherosclerotic plaque on the vertebral arteries. The vertebrobasilar arteries are smooth and widely patent. No PCA branch stenosis or occlusion. Negative for aneurysm Venous sinuses: Diffusely patent Anatomic variants: None significant Delayed phase: Delayed phase shows an avidly enhancing cortically based mass in the left high parietal lobe measuring 16 mm. A second mass is not seen. Case discussed with Dr. Leonel Ramsay who is already aware of the findings. Review of the MIP images confirms the above findings IMPRESSION: 1. 16 mm left parietal mass with extensive vasogenic edema. The mass is considered secondary to metastatic disease given adenopathy is seen in the mediastinum/bilateral supraclavicular fossae and there are right upper lobe nodules. 2. Atherosclerosis without emergent vascular finding. 3. 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 Electronically Signed   By: Monte Fantasia M.D.   On:  09/01/2019 14:25   Ct Code Stroke Cta Neck W/wo Contrast  Result Date: 09/01/2019 CLINICAL DATA:  Slurred speech and facial droop EXAM: CT ANGIOGRAPHY HEAD AND NECK TECHNIQUE: Multidetector CT imaging of the head and neck was performed using the standard protocol during bolus administration of intravenous contrast. Multiplanar CT image reconstructions and MIPs were obtained to evaluate the vascular anatomy. Carotid stenosis measurements (when applicable) are obtained utilizing NASCET criteria, using the distal internal carotid diameter as the denominator. CONTRAST:  1mL OMNIPAQUE IOHEXOL 350 MG/ML SOLN COMPARISON:  None. FINDINGS: CTA NECK FINDINGS Aortic arch: Extensive atherosclerotic plaque which is irregular. Three vessel branching. Right carotid system: Atheromatous wall thickening of the brachiocephalic to common carotid bifurcation. Atherosclerotic plaque at the bifurcation is moderate and mainly calcified. Right ICA origin stenosis measures 50%. Left carotid system: Atherosclerotic plaque mainly at the common carotid and ICA origins without flow limiting stenosis or ulceration. Vertebral arteries: Extensive proximal subclavian atherosclerosis without flow limiting stenosis. Atherosclerotic plaque on the vertebral arteries on both sides without flow limiting stenosis or dissection. Skeleton: No acute or aggressive finding Other neck: Adenopathy in the bilateral supraclavicular fossae. Upper chest: Partially covered mediastinum shows bilateral adenopathy and multiple right-sided pulmonary nodules. Review of the MIP images confirms the above findings CTA HEAD FINDINGS Anterior circulation: Diffuse atherosclerotic plaque on the carotid siphons. No branch occlusion or flow limiting stenosis. Negative for aneurysm Posterior circulation: Atherosclerotic plaque on the vertebral arteries. The vertebrobasilar arteries are smooth and widely patent. No PCA branch stenosis or occlusion. Negative for aneurysm Venous  sinuses: Diffusely patent Anatomic variants: None significant Delayed phase: Delayed phase shows an avidly enhancing cortically based mass in the left high parietal lobe measuring 16 mm. A second mass is not seen. Case discussed with Dr. Leonel Ramsay who is already aware of the findings. Review of the MIP images confirms the above findings IMPRESSION: 1. 16 mm left parietal mass with extensive vasogenic edema. The mass is considered secondary to metastatic disease given adenopathy is seen in the mediastinum/bilateral supraclavicular fossae and there are right upper lobe nodules. 2. Atherosclerosis without emergent vascular finding. 3. 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 Electronically Signed   By: Monte Fantasia M.D.   On: 09/01/2019 14:25   Ct  Head Code Stroke Wo Contrast  Result Date: 09/01/2019 CLINICAL DATA:  Code stroke. EXAM: CT HEAD WITHOUT CONTRAST TECHNIQUE: Contiguous axial images were obtained from the base of the skull through the vertex without intravenous contrast. COMPARISON:  None. FINDINGS: Brain: Extensive edema in the left posterior frontal and parietal hemisphere with gray matter sparing, cytotoxic appearing. A discrete mass is not clearly seen. No hemorrhage, hydrocephalus, or collection. Vascular: Atherosclerotic calcification.  No hyperdense vessel Skull: No acute finding. Periodontal erosions which are partially covered. Sinuses/Orbits: Bilateral cataract resection. Pending direct communication with Neurology team. ASPECTS [facility] Stroke Program Early CT Score) Not scored in this setting IMPRESSION: Extensive edema in the left posterior frontal and parietal lobes with vasogenic appearance suggesting underlying mass or inflammation/infection. Electronically Signed   By: Monte Fantasia M.D.   On: 09/01/2019 14:15       Laurey Morale, MSN, NP-C [facility] [phone]  09/01/2019, 2:15 PM   Attending physician note to follow with Assessment and plan . I have seen the  patient reviewed the above note.  He has noted some problems with his right arm since at least November 6.  Today, however, he had a sudden change with worsening of his symptoms.  He describes intermittent episodes of 30 seconds to a minute of right arm jerking that have been happening over the past few days.   Assessment: CORDARO MUKAI is a 78 yo male with PMH  Tobacco use, HTN, HLD, DM, CHF who presented to [facility] ED with c/o of right facial droop/slurred speech and worsening right side weakness in the setting of newly diagnosed metastatic brain mass.  He has significant cerebral edema surrounding the brain mass.  I suspect that his abrupt worsening of a was likely related to a seizure.   Recommendations: --MRI brain w/wo --EEG(nonurgent) --start Keppra 1000 mg load then 500 mg BID  --Decadron 10 mg x 1 then 6 mg twice daily -smoking cessation - cancer workup per primary team  -He will need nonurgent neurosurgical consultation   Roland Rack, MD [facility] [phone]  If 7pm- 7am, please page neurology on call as listed in [place].

## 2019-09-01 NOTE — ED Triage Notes (Signed)
[facility] reports pt had new onset slurred speech, right facial droop and increased right arm weakness. Pt had recent possible stroke on 11/6. VS stable  Code stroke called.

## 2019-09-01 NOTE — ED Provider Notes (Signed)
[facility] EMERGENCY DEPARTMENT Provider Note   CSN: [id number] Arrival date & time: 09/01/19  1335  An emergency department physician performed an initial assessment on this suspected stroke patient at 1337.  History   Chief Complaint Chief Complaint  Patient presents with  . Code Stroke    HPI Geoffrey Benjamin is a 78 y.o. male.     HPI Patient with multiple medical issues including diagnosis of stroke earlier in the month presents with tremor, right-sided weakness, speech difficulty, facial asymmetry.  Apparently, the patient has had right-sided deficit, including weakness, tremor, contraction for about 3 weeks. He was sent for outpatient follow-up with neurology earlier this week.  He notes that he was advised to have MRI for additional definition has not had that test performed yet, as it is Thanksgiving week. Today, about 11 AM, approximately 2 hours prior to ED arrival the patient's wife noticed new asymmetry beyond baseline right upper extremity weakness, as well as new aphasia, prompting evaluation per The patient self acknowledges this, denies pain, denies confusion, denies discomfort. There is no clear precipitant for this change.   Past Medical History:  Diagnosis Date  . Aortic stenosis 09/13/2018  . CAP (community acquired pneumonia) 09/13/2018  . CHF (congestive heart failure) ([place])   . Difficulty walking   . DM (diabetes mellitus) ([place])   . GERD 06/04/2009   Qualifier: Diagnosis of  By: Henrene Pastor MD, Docia Chuck   . Hyperlipidemia 09/13/2018  . Hypertension   . Loss of weight 09/29/2008   Qualifier: Diagnosis of  By: Bobby Rumpf CMA (AAMA), Patty    . PERSONAL HX COLONIC POLYPS 06/04/2009   Qualifier: Diagnosis of  By: Henrene Pastor MD, [place] PAIN 09/29/2008   Qualifier: Diagnosis of  By: Bobby Rumpf CMA (AAMA), Patty    . Tachycardia   . Tobacco use 09/13/2018  . Volume overload 09/13/2018    Patient Active Problem List   Diagnosis Date Noted  . CAP  (community acquired pneumonia) 09/13/2018  . Hyperlipidemia 09/13/2018  . Volume overload 09/13/2018  . Tobacco use 09/13/2018  . Aortic stenosis 09/13/2018  . GERD 06/04/2009  . PERSONAL HX COLONIC POLYPS 06/04/2009  . LOSS OF WEIGHT 09/29/2008  . RLQ PAIN 09/29/2008  . Type 2 diabetes mellitus ([place]) 09/22/2008    Past Surgical History:  Procedure Laterality Date  . CATARACT EXTRACTION, BILATERAL  08/2016  . DOPPLER ECHOCARDIOGRAPHY     Mitral inflow and tissue doppler consistent with impaired LV relaxation; Trileaflet aortic vlave with moderate aortic valve stenosis, Trivial mitral and tricuspid valve regurgitation.        Home Medications    Prior to Admission medications   Medication Sig Start Date End Date Taking? Authorizing Provider  aspirin EC 81 MG tablet Take 81 mg by mouth daily.    [provider]  atorvastatin (LIPITOR) 80 MG tablet Take 80 mg by mouth daily. 08/26/19   [provider]  furosemide (LASIX) 40 MG tablet Take 1 tablet (40 mg total) by mouth daily. 10/12/18 10/12/19  Josue Hector, MD  glipiZIDE (GLUCOTROL) 5 MG tablet Take 5 mg by mouth daily before breakfast.    [provider]  lisinopril (PRINIVIL,ZESTRIL) 2.5 MG tablet Take 1 tablet (2.5 mg total) by mouth daily. 10/12/18   Josue Hector, MD  metFORMIN (GLUCOPHAGE) 1000 MG tablet Take 1 tablet (1,000 mg total) by mouth 2 (two) times daily with a meal. 09/15/18   Kayleen Memos, DO  metoprolol tartrate (LOPRESSOR) 25 MG tablet Take 1 tablet (25 mg total) by mouth 2 (two) times daily. 10/12/18   Josue Hector, MD  mirtazapine (REMERON) 15 MG tablet Take 15 mg by mouth at bedtime. 08/21/19   [provider]  Omega-3 Fatty Acids (FISH OIL) 1200 MG CAPS Take 1,200 mg by mouth daily.    [provider]  pantoprazole (PROTONIX) 40 MG tablet Take 1 tablet (40 mg total) by mouth daily. 10/16/18   Josue Hector, MD  potassium chloride SA (K-DUR,KLOR-CON) 20 MEQ  tablet Take 1 tablet (20 mEq total) by mouth daily. 10/12/18   Josue Hector, MD    Family History Family History  Problem Relation Age of Onset  . Kidney disease Father   . Stroke Mother     Social History Social History   Tobacco Use  . Smoking status: Current Every Day Smoker    Packs/day: 1.50    Types: Cigarettes  . Smokeless tobacco: Never Used  . Tobacco comment: SMOKED FOR 60 PLUS YEARS  Substance Use Topics  . Alcohol use: Yes    Comment: occas  . Drug use: Never     Allergies   Patient has no known allergies.   Review of Systems Review of Systems  Constitutional:       Per HPI, otherwise negative  HENT:       Per HPI, otherwise negative  Respiratory:       Per HPI, otherwise negative  Cardiovascular:       Per HPI, otherwise negative  Gastrointestinal: Negative for vomiting.  Endocrine:       Negative aside from HPI  Genitourinary:       Neg aside from HPI   Musculoskeletal:       Per HPI, otherwise negative  Skin: Negative.   Neurological: Positive for tremors, speech difficulty and weakness. Negative for syncope.     Physical Exam Updated Vital Signs BP 132/63   Pulse 82   Temp 98.3 F (36.8 C) (Oral)   Resp 16   Ht 6' (1.829 m)   SpO2 96%   BMI 22.11 kg/m   Physical Exam Vitals signs and nursing note reviewed.  Constitutional:      General: He is not in acute distress.    Appearance: He is well-developed. He is ill-appearing.  HENT:     Head: Normocephalic and atraumatic.  Eyes:     Conjunctiva/sclera: Conjunctivae normal.  Cardiovascular:     Rate and Rhythm: Normal rate and regular rhythm.  Pulmonary:     Effort: Pulmonary effort is normal. No respiratory distress.     Breath sounds: No stridor.  Abdominal:     General: There is no distension.  Skin:    General: Skin is warm and dry.  Neurological:     Mental Status: He is alert and oriented to person, place, and time.     Cranial Nerves: Cranial nerve deficit and  facial asymmetry present.     Motor: Weakness, tremor and atrophy present.     Comments: Right facial loss of muscle tone, right arm in contraction, with mild tremor, right-sided.      ED Treatments / Results  Labs (all labs ordered are listed, but only abnormal results are displayed) Labs Reviewed  CBC - Abnormal; Notable for the following components:      Result Value   WBC 11.9 (*)    RBC 3.73 (*)    Hemoglobin 11.8 (*)    HCT  34.7 (*)    All other components within normal limits  DIFFERENTIAL - Abnormal; Notable for the following components:   Neutro Abs 8.7 (*)    Eosinophils Absolute 0.7 (*)    All other components within normal limits  I-STAT CHEM 8, ED - Abnormal; Notable for the following components:   Sodium 133 (*)    Chloride 96 (*)    Glucose, Bld 152 (*)    Calcium, Ion 1.07 (*)    Hemoglobin 12.2 (*)    HCT 36.0 (*)    All other components within normal limits  CBG MONITORING, ED - Abnormal; Notable for the following components:   Glucose-Capillary 149 (*)    All other components within normal limits  APTT  PROTIME-INR  COMPREHENSIVE METABOLIC PANEL    EKG EKG Interpretation  Date/Time:  Sunday September 01 2019 14:14:34 EST Ventricular Rate:  83 PR Interval:    QRS Duration: 112 QT Interval:  405 QTC Calculation: 476 R Axis:   -62 Text Interpretation: Sinus rhythm Left anterior fascicular block Left ventricular hypertrophy Anterior Q waves, possibly due to LVH No significant change since last tracing Abnormal ECG Confirmed by Carmin Muskrat [phone]) on 09/01/2019 2:20:46 PM   Radiology Ct Head Code Stroke Wo Contrast  Result Date: 09/01/2019 CLINICAL DATA:  Code stroke. EXAM: CT HEAD WITHOUT CONTRAST TECHNIQUE: Contiguous axial images were obtained from the base of the skull through the vertex without intravenous contrast. COMPARISON:  None. FINDINGS: Brain: Extensive edema in the left posterior frontal and parietal hemisphere with gray matter  sparing, cytotoxic appearing. A discrete mass is not clearly seen. No hemorrhage, hydrocephalus, or collection. Vascular: Atherosclerotic calcification.  No hyperdense vessel Skull: No acute finding. Periodontal erosions which are partially covered. Sinuses/Orbits: Bilateral cataract resection. Pending direct communication with Neurology team. ASPECTS [facility] Stroke Program Early CT Score) Not scored in this setting IMPRESSION: Extensive edema in the left posterior frontal and parietal lobes with vasogenic appearance suggesting underlying mass or inflammation/infection. Electronically Signed   By: Monte Fantasia M.D.   On: 09/01/2019 14:15    Procedures Procedures (including critical care time)  Medications Ordered in ED Medications  sodium chloride flush (NS) 0.9 % injection 3 mL (has no administration in time range)  levETIRAcetam (KEPPRA) IVPB 1000 mg/100 mL premix (1,000 mg Intravenous New Bag/Given 09/01/19 1449)  levETIRAcetam (KEPPRA) tablet 500 mg (has no administration in time range)  iohexol (OMNIPAQUE) 350 MG/ML injection 75 mL (75 mLs Intravenous Contrast Given 09/01/19 1355)  dexamethasone (DECADRON) injection 10 mg (10 mg Intravenous Given 09/01/19 1441)     Initial Impression / Assessment and Plan / ED Course  I have reviewed the triage vital signs and the nursing notes.  Pertinent labs & imaging results that were available during my care of the patient were reviewed by me and considered in my medical decision making (see chart for details).    Patient's case conducted expeditiously with our neurology colleagues.    2:59 PM Patient awake, alert, continues to have facial asymmetry, slight amount of fluid trickling from the right side of his mouth with swallow study, but otherwise no gross evidence for aspiration.  He is aware of findings thus far, and additional conversation with her neurologist, discussing CT scan is notable for concern for possible underlying malignancy  versus other foci of edema contributing to the patient's weakness, possible seizure-like activity. Patient will have Keppra loading.  Along with concern for seizure, possible mass versus stroke versus malignancy, the patient will require  admission for further monitoring, management, evaluation. MRI, Covid both pending on admission.   Final Clinical Impressions(s) / ED Diagnoses   Weakness Brain mass   Carmin Muskrat, MD 09/01/19 1501

## 2019-09-01 NOTE — Progress Notes (Addendum)
2128 Pt arrived to floor via gurney accompanied by ED staff. Pt transferred independently by sliding from gurney to bed. AOx3 and verbally responsive. No c/o pain or discomfort or seizure activity noted. Noted slight weakness in R hand but able to squeeze with moderate strength. Complete bed bath given and pt changed into gown, condom catheter placed on pt, skin assessment done and verified by Cristal Deer. Noted scabbed area to RUA and small bruises to BUE, sacral area is red and blanches with no open areas.Pt placed on Telemetry. Oriented to ward, room, call bell, phone and tv. Will cont to monitor.

## 2019-09-01 NOTE — ED Notes (Signed)
Ruby ([facility] called/Canceled Code Stroke @ 1419)-per Dr. Leonel Ramsay called by Levada Dy

## 2019-09-01 NOTE — H&P (Addendum)
Date: 09/01/2019               Patient Name:  Geoffrey Benjamin MRN: [id number]  DOB: 09-17-1941 Age / Sex: 78 y.o., male   PCP: Geoffrey Bene, PA-C         Medical Service: Internal Medicine Teaching Service         Attending Physician: Dr. Carmin Muskrat, MD    First Contact: Dr. Court Benjamin Pager: [phone]  Second Contact: Dr. Koleen Benjamin Pager: [phone]       After Hours (After 5p/  First Contact Pager: [phone]  weekends / holidays): Second Contact Pager: [phone]   Chief Complaint: right sided weakness, gait difficulty, dysarthria   History of Present Illness: Geoffrey Benjamin is a 78 y/o gentleman with history of tobacco use , alcohol use, HTN, T2DM, and HFrEF ( EF 30-35% 09/2018), who presents with right-sided weakness, gait difficulty, and dysarthria. He began having right hand weakness approximately 1 month ago. On 11/6 PCP referred to outpatient neurology, told he may be having a mini-stroke, and was referred to get an MRI. Patient presents to ED with weakness progressed to R. arm and R.leg with associated gait difficulty. This morning wife noticed he was having difficulty with speech , right sided facial droop , and seemed to have worsening weakness so brought him to the ED. Had episode of bowel incontinence yesterday. Endorses intermittent headaches, no worse than usual and ~6lb weight loss this month. No episodes of shaking, tremors ,LOC,  vision changes, chest pain, or shortness of breath.   Meds:  No current facility-administered medications on file prior to encounter.    Current Outpatient Medications on File Prior to Encounter  Medication Sig Dispense Refill  . aspirin EC 81 MG tablet Take 81 mg by mouth daily.    Marland Kitchen atorvastatin (LIPITOR) 80 MG tablet Take 80 mg by mouth daily.    . furosemide (LASIX) 40 MG tablet Take 1 tablet (40 mg total) by mouth daily. 90 tablet 3  . glipiZIDE (GLUCOTROL) 5 MG tablet Take 5 mg by mouth daily before breakfast.    . lisinopril  (PRINIVIL,ZESTRIL) 2.5 MG tablet Take 1 tablet (2.5 mg total) by mouth daily. (Patient taking differently: Take 2.5 mg by mouth every evening. ) 90 tablet 3  . metFORMIN (GLUCOPHAGE) 1000 MG tablet Take 1 tablet (1,000 mg total) by mouth 2 (two) times daily with a meal. 30 tablet 0  . metoprolol tartrate (LOPRESSOR) 25 MG tablet Take 1 tablet (25 mg total) by mouth 2 (two) times daily. 180 tablet 3  . mirtazapine (REMERON) 15 MG tablet Take 15 mg by mouth at bedtime.    . Omega-3 Fatty Acids (FISH OIL) 1200 MG CAPS Take 1,200 mg by mouth daily.    . pantoprazole (PROTONIX) 40 MG tablet Take 1 tablet (40 mg total) by mouth daily. (Patient taking differently: Take 40 mg by mouth every evening. ) 30 tablet 11  . potassium chloride SA (K-DUR,KLOR-CON) 20 MEQ tablet Take 1 tablet (20 mEq total) by mouth daily. (Patient taking differently: Take 20 mEq by mouth every evening. ) 90 tablet 3    Allergies: Allergies as of 09/01/2019  . (No Known Allergies)   Past Medical History:  Diagnosis Date  . Aortic stenosis 09/13/2018  . CAP (community acquired pneumonia) 09/13/2018  . CHF (congestive heart failure) ([place])   . Difficulty walking   . DM (diabetes mellitus) ([place])   . GERD 06/04/2009   Qualifier: Diagnosis of  ByHenrene Pastor MD, Docia Chuck   . Hyperlipidemia 09/13/2018  . Hypertension   . Loss of weight 09/29/2008   Qualifier: Diagnosis of  By: Bobby Rumpf CMA (AAMA), Patty    . PERSONAL HX COLONIC POLYPS 06/04/2009   Qualifier: Diagnosis of  By: Henrene Pastor MD, [place] PAIN 09/29/2008   Qualifier: Diagnosis of  By: Bobby Rumpf CMA (AAMA), Patty    . Tachycardia   . Tobacco use 09/13/2018  . Volume overload 09/13/2018    Family History:  Family History  Problem Relation Age of Onset  . Kidney disease Father   . Stroke Mother      Social History:  Social History   Tobacco Use  . Smoking status: Current Every Day Smoker    Packs/day: 1.50    Types: Cigarettes  . Smokeless tobacco: Never Used  .  Tobacco comment: SMOKED FOR 60 PLUS YEARS  Substance Use Topics  . Alcohol use: Yes    Comment: occas  . Drug use: Never     Review of Systems:  A complete ROS was negative except as per HPI.   Physical Exam: Blood pressure 132/63, pulse 82, temperature 98.3 F (36.8 C), temperature source Oral, resp. rate 16, height 6' (1.829 m), SpO2 96 %.  General: No acute distress,  HEENT: anti-icteric, normal conjunctiva. No oral exudates. Cardiovascular: Regular rate and rhythm, 3 out of 6 systolic murmur Pulmonary: Non-labored, not moving good air on exam Abdominal bowel sounds present no distention Skin: Warm and dry Neuro: Mental Status: Patient is awake, alert, oriented x3 Expressive aphasia, dysarthria  Cranial Nerves: II: Pupils equal, round, and reactive to light.   III,IV, VI: EOMI without ptosis or diploplia.  V:facial sensation intact to light touch VII: Facial movement is asymmetric. , right facial droop VIII: hearing is intact to voice X: Uvula elevates symmetrically XI: Shoulder shrug is symmetric. XII: tongue is midline without atrophy or fasciculations.  Motor:  3/5 R UE, 3/5 R lower extremitiy , 5/5 Stregth on LUE and LLE Sensory: Sensation is grossly non intact on right side UE or LE Deep Tendon Reflexes: 2+ symmetric patella, biceps,  Plantars: Toes are downgoing  Cerebellar: Finger-Nose intact on left hand, limited on R by stregth   EKG: personally reviewed my interpretation is sinus rhythm with LAFB  Assessment & Plan by Problem: Active Problems:   * No active hospital problems. 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  #Metastatic brain masses MR brain showed 5 brain metastasis most notably a 2.5 cm enhancing lesion in the left parietal lobe with extensive  vasogenic edema.  Approximately 20 pound weight loss since beginning of the year. Likely source would be lung with 60 year of smoking tobacco. Other sources to consider are melanoma and colon cancer. Last colonoscopy in 2010 with polyps removed , but pathology noted no dysplasia. Recommended follow-up in 3 years, but no record of repeat colonoscopy.  -Seen by neurology , appreciate the recommendations. - Keppra 1000mg  , and started on 500 mg BID. - Decadron 10 mg x 1 then 6 mg twice daily - CT chest abdomen and pelvis ordered, will be done tomorrow - Consult neurosurgery in the morning   #HTN -Home meds Lisinopril and metoprolol -Continue metoprolol  #HFrEF -Continue home aspirin and statin -  Holding Lasix  #T2DM -Hold home meds glipizide and metformin - SSI    Dispo: Admit patient to Inpatient with expected length of stay greater than 2 midnights.  Signed:  Tamsen Snider, MD PGY1  [phone]

## 2019-09-01 NOTE — ED Notes (Signed)
Admitting at bedside 

## 2019-09-01 NOTE — ED Triage Notes (Signed)
Please call wife when Pt gets a bed. I concirmed the # 's in the contact  Section of chart . Wife's name is [name]

## 2019-09-02 ENCOUNTER — Inpatient Hospital Stay (HOSPITAL_COMMUNITY): Payer: Medicare Other

## 2019-09-02 ENCOUNTER — Ambulatory Visit
Admit: 2019-09-02 | Discharge: 2019-09-02 | Disposition: A | Discharge status: Home / Self Care | Payer: Medicare Other | Attending: Radiation Oncology | Admitting: Radiation Oncology

## 2019-09-02 ENCOUNTER — Other Ambulatory Visit: Payer: Self-pay

## 2019-09-02 DIAGNOSIS — C3490 Malignant neoplasm of unspecified part of unspecified bronchus or lung: Secondary | ICD-10-CM

## 2019-09-02 DIAGNOSIS — R569 Unspecified convulsions: Secondary | ICD-10-CM

## 2019-09-02 DIAGNOSIS — G40009 Localization-related (focal) (partial) idiopathic epilepsy and epileptic syndromes with seizures of localized onset, not intractable, without status epilepticus: Secondary | ICD-10-CM

## 2019-09-02 DIAGNOSIS — C7931 Secondary malignant neoplasm of brain: Principal | ICD-10-CM

## 2019-09-02 DIAGNOSIS — E119 Type 2 diabetes mellitus without complications: Secondary | ICD-10-CM

## 2019-09-02 DIAGNOSIS — I509 Heart failure, unspecified: Secondary | ICD-10-CM

## 2019-09-02 DIAGNOSIS — R590 Localized enlarged lymph nodes: Secondary | ICD-10-CM

## 2019-09-02 DIAGNOSIS — I714 Abdominal aortic aneurysm, without rupture: Secondary | ICD-10-CM

## 2019-09-02 LAB — BASIC METABOLIC PANEL
Anion gap: 13 (ref 5–15)
BUN: 11 mg/dL (ref 8–23)
CO2: 25 mmol/L (ref 22–32)
Calcium: 8.7 mg/dL — ABNORMAL LOW (ref 8.9–10.3)
Chloride: 99 mmol/L (ref 98–111)
Creatinine, Ser: 0.95 mg/dL (ref 0.61–1.24)
GFR calc Af Amer: >60 mL/min (ref >60)
GFR calc non Af Amer: >60 mL/min (ref >60)
Glucose, Bld: 138 mg/dL — ABNORMAL HIGH (ref 70–99)
Potassium: 4.5 mmol/L (ref 3.5–5.1)
Sodium: 137 mmol/L (ref 135–145)

## 2019-09-02 LAB — CBC
HCT: 34.4 % — ABNORMAL LOW (ref 39.0–52.0)
Hemoglobin: 11.8 g/dL — ABNORMAL LOW (ref 13.0–17.0)
MCH: 31.2 pg (ref 26.0–34.0)
MCHC: 34.3 g/dL (ref 30.0–36.0)
MCV: 91 fL (ref 80.0–100.0)
Platelets: 302 10*3/uL (ref 150–400)
RBC: 3.78 MIL/uL — ABNORMAL LOW (ref 4.22–5.81)
RDW: 12 % (ref 11.5–15.5)
WBC: 8.7 10*3/uL (ref 4.0–10.5)
nRBC: 0 % (ref 0.0–0.2)

## 2019-09-02 LAB — GLUCOSE, CAPILLARY
Glucose-Capillary: 108 mg/dL — ABNORMAL HIGH (ref 70–99)
Glucose-Capillary: 124 mg/dL — ABNORMAL HIGH (ref 70–99)
Glucose-Capillary: 218 mg/dL — ABNORMAL HIGH (ref 70–99)
Glucose-Capillary: 85 mg/dL (ref 70–99)

## 2019-09-02 LAB — HIV ANTIBODY (ROUTINE TESTING W REFLEX): HIV Screen 4th Generation wRfx: NONREACTIVE

## 2019-09-02 MED ORDER — SODIUM CHLORIDE 0.9 % IV BOLUS
250.0000 mL | Freq: Once | INTRAVENOUS | Status: AC
  Administered 2019-09-02: 250 mL via INTRAVENOUS

## 2019-09-02 MED ORDER — DEXAMETHASONE 4 MG PO TABS
4.0000 mg | ORAL_TABLET | Freq: Three times a day (TID) | ORAL | Status: DC
  Administered 2019-09-03 – 2019-09-04 (×5): 4 mg via ORAL
  Filled 2019-09-02 (×6): qty 1

## 2019-09-02 MED ORDER — GADOBUTROL 1 MMOL/ML IV SOLN
7.0000 mL | Freq: Once | INTRAVENOUS | Status: AC | PRN
  Administered 2019-09-02: 7 mL via INTRAVENOUS

## 2019-09-02 MED ORDER — IOHEXOL 300 MG/ML  SOLN
100.0000 mL | Freq: Once | INTRAMUSCULAR | Status: AC | PRN
  Administered 2019-09-02: 100 mL via INTRAVENOUS

## 2019-09-02 NOTE — Progress Notes (Signed)
Neurology Progress Note   S:// Patient seen and examined in the CT scanner while he was getting the CT chest abdomen pelvis. Offers no new complaints.  O:// Current vital signs: BP 137/72 (BP Location: Right Arm)   Pulse 92   Temp 99.5 F (37.5 C) (Oral)   Resp 17   Ht 6' (1.829 m)   Wt 72.1 kg   SpO2 96%   BMI 21.56 kg/m  Vital signs in last 24 hours: Temp:  [97.6 F (36.4 C)-99.5 F (37.5 C)] 99.5 F (37.5 C) (11/30 1709) Pulse Rate:  [63-92] 92 (11/30 1709) Resp:  [16-25] 17 (11/30 1709) BP: (105-148)/(57-72) 137/72 (11/30 1709) SpO2:  [94 %-98 %] 96 % (11/30 1709) Weight:  [72.1 kg] 72.1 kg (11/30 0235) Neurological exam Awake alert oriented to self and place. Unable to tell me the date. Able to follow all commands Slow to respond to questions but no evidence of aphasia. Cranial nerves: Pupil equal round reactive to light bilaterally, extraocular movements intact, visual fields full, face symmetric, tongue and palate midline. Motor exam: Right upper extremity 3/5 with drift.  Right lower extremity 4/5 with drift.  Left upper and lower extremity 5/5. Sensory exam: Decreased sensation on the left in comparison to the right. Cerebellar: No dysmetria  Medications  Current Facility-Administered Medications:  .  aspirin EC tablet 81 mg, 81 mg, Oral, Daily, Bloomfield, Carley D, DO, 81 mg at 09/02/19 0917 .  atorvastatin (LIPITOR) tablet 80 mg, 80 mg, Oral, Daily, Bloomfield, Carley D, DO, 80 mg at 09/02/19 0919 .  dexamethasone (DECADRON) injection 6 mg, 6 mg, Intravenous, Q12H, Hayden Pedro, PA-C, 6 mg at 09/02/19 0919 .  [START ON 09/03/2019] dexamethasone (DECADRON) tablet 4 mg, 4 mg, Oral, Q8H, Hayden Pedro, PA-C .  enoxaparin (LOVENOX) injection 40 mg, 40 mg, Subcutaneous, Q24H, Bloomfield, Carley D, DO, 40 mg at 09/02/19 1755 .  insulin aspart (novoLOG) injection 0-15 Units, 0-15 Units, Subcutaneous, TID WC, Bloomfield, Carley D, DO, 5 Units at  09/02/19 1759 .  insulin aspart (novoLOG) injection 0-5 Units, 0-5 Units, Subcutaneous, QHS, Bloomfield, Carley D, DO, 3 Units at 09/01/19 2341 .  levETIRAcetam (KEPPRA) tablet 500 mg, 500 mg, Oral, BID, Greta Doom, MD, 500 mg at 09/02/19 0920 .  metoprolol tartrate (LOPRESSOR) tablet 25 mg, 25 mg, Oral, BID, Bloomfield, Carley D, DO, 25 mg at 09/02/19 0918 .  mirtazapine (REMERON) tablet 15 mg, 15 mg, Oral, QHS, Bloomfield, Carley D, DO, 15 mg at 09/01/19 2355 .  nicotine (NICODERM CQ - dosed in mg/24 hours) patch 21 mg, 21 mg, Transdermal, Daily, Bloomfield, Carley D, DO, 21 mg at 09/02/19 0921 .  pantoprazole (PROTONIX) EC tablet 40 mg, 40 mg, Oral, Daily, Bloomfield, Carley D, DO, 40 mg at 09/02/19 0918 .  sodium chloride flush (NS) 0.9 % injection 3 mL, 3 mL, Intravenous, Once, Modena Nunnery D, DO Labs CBC    Component Value Date/Time   WBC 8.7 09/02/2019 0218   RBC 3.78 (L) 09/02/2019 0218   HGB 11.8 (L) 09/02/2019 0218   HGB 15.9 12/23/2008 0921   HCT 34.4 (L) 09/02/2019 0218   HCT 45.6 12/23/2008 0921   PLT 302 09/02/2019 0218   PLT 281 12/23/2008 0921   MCV 91.0 09/02/2019 0218   MCV 93.9 12/23/2008 0921   MCH 31.2 09/02/2019 0218   MCHC 34.3 09/02/2019 0218   RDW 12.0 09/02/2019 0218   RDW 12.9 12/23/2008 0921   LYMPHSABS 1.4 09/01/2019 1348   LYMPHSABS 1.8  12/23/2008 0921   MONOABS 0.9 09/01/2019 1348   MONOABS 0.4 12/23/2008 0921   EOSABS 0.7 (H) 09/01/2019 1348   EOSABS 0.1 12/23/2008 0921   BASOSABS 0.1 09/01/2019 1348   BASOSABS 0.1 12/23/2008 0921    CMP     Component Value Date/Time   NA 137 09/02/2019 0218   K 4.5 09/02/2019 0218   CL 99 09/02/2019 0218   CO2 25 09/02/2019 0218   GLUCOSE 138 (H) 09/02/2019 0218   BUN 11 09/02/2019 0218   CREATININE 0.95 09/02/2019 0218   CALCIUM 8.7 (L) 09/02/2019 0218   PROT 6.2 (L) 09/01/2019 1348   ALBUMIN 3.0 (L) 09/01/2019 1348   AST 15 09/01/2019 1348   ALT 15 09/01/2019 1348   ALKPHOS 51  09/01/2019 1348   BILITOT 0.5 09/01/2019 1348   GFRNONAA >60 09/02/2019 0218   GFRAA >60 09/02/2019 0218    glycosylated hemoglobin  Lipid Panel     Component Value Date/Time   CHOL 128 09/15/2018 0607   TRIG 114 09/15/2018 0607   HDL 30 (L) 09/15/2018 0607   CHOLHDL 4.3 09/15/2018 0607   VLDL 23 09/15/2018 0607   LDLCALC 75 09/15/2018 0607   EEG with evidence of epileptogenicity as well as cortical dysfunction in the left frontotemporal region likely secondary to the underlying metastatic disease.  No seizures seen.  Imaging I have reviewed images in epic and the results pertinent to this consultation are: CT of the head 23m left parietal mass with extensive vasogenic edema.  Likely metastatic due to adenopathy seen in mediastinum/bilateral supraclavicular fossa.  There is also right upper lobe nodules visualized on CTA head and neck which also showed 50% right ICA stenosis. MRI of the brain showed at least 5 brain metastases most notably 2.5 cm enhancing lesion in the left parietal lobe with extensive vasogenic edema.  CT chest abdomen pelvis formal report IMPRESSION: 1. Bulky mediastinal adenopathy in supraclavicular adenopathy with multiple pulmonary nodules compatible with metastatic disease. 2. Process potentially related to primary lung cancer. 3. 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 This may be a chronic finding but was not present in 2010. Consider correlation with any symptoms of pain in this area and follow-up vascular surgery consultation, more urgent if the patient is experiencing symptoms that would suggest instability of this process, i.e. acute back pain. 4. Question hyperenhancement of the left hemiprostate. This could also be related to calcification within the gland, nonspecific overall finding. Correlation with PSA may be helpful given  other findings on today's scan that are compatible with metastatic disease. 5. Emphysema and aortic atherosclerosis.  Assessment: 78year old man past history of tobacco use, hypertension hyperlipidemia diabetes CHF presented to [facility] ED for complaint of right facial droop slurred speech and worsening right-sided weakness along with intermittent jerky movements of the right arm over the last few weeks. MRI consistent with metastatic disease in the brain with the most prominent brain met in the left parietal lobe with extensive vasogenic edema likely the culprit for his presenting symptoms Metastatic disease in the brain is likely from the lung primary. EEG with underlying potential for epileptogenicity, and slowing but no seizures in that left hemisphere.  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  Follow recommendations. Continue Decadron for vasogenic cerebral edema from the mets.  Further management in terms of radiation/surgery per oncology. Continue  Keppra Maintain seizure precautions Inpatient neurology will be available as needed.  Please call with questions.  -- Amie Portland, MD [facility] Pager: [phone] If 7pm to 7am, please call on call as listed on AMION.

## 2019-09-02 NOTE — Evaluation (Signed)
Clinical/Bedside Swallow Evaluation Patient Details  Name: Geoffrey Benjamin MRN: [id number] Date of Birth: 12-16-1940  Today's Date: 09/02/2019 Time: SLP Start Time (ACUTE ONLY): 4 SLP Stop Time (ACUTE ONLY): 1333 SLP Time Calculation (min) (ACUTE ONLY): 15 min  Past Medical History:  Past Medical History:  Diagnosis Date  . Aortic stenosis 09/13/2018  . CAP (community acquired pneumonia) 09/13/2018  . CHF (congestive heart failure) ([place])   . Difficulty walking   . DM (diabetes mellitus) ([place])   . GERD 06/04/2009   Qualifier: Diagnosis of  By: Henrene Pastor MD, Docia Chuck   . Hyperlipidemia 09/13/2018  . Hypertension   . Loss of weight 09/29/2008   Qualifier: Diagnosis of  By: Bobby Rumpf CMA (AAMA), Patty    . PERSONAL HX COLONIC POLYPS 06/04/2009   Qualifier: Diagnosis of  By: Henrene Pastor MD, [place] PAIN 09/29/2008   Qualifier: Diagnosis of  By: Bobby Rumpf CMA (AAMA), Patty    . Tachycardia   . Tobacco use 09/13/2018  . Volume overload 09/13/2018   Past Surgical History:  Past Surgical History:  Procedure Laterality Date  . CATARACT EXTRACTION, BILATERAL  08/2016  . DOPPLER ECHOCARDIOGRAPHY     Mitral inflow and tissue doppler consistent with impaired LV relaxation; Trileaflet aortic vlave with moderate aortic valve stenosis, Trivial mitral and tricuspid valve regurgitation.   HPI:  78 yo male with history of hypertension type 2 diabetes, heart failure with reduced ejection fraction, tobacco use and alcohol use who presented for worsening right-sided weakness.  HPI is notable for 3 weeks of right-sided weakness for which he was referred to and seen by neurology.  An MRI had been ordered but not yet performed when he had sudden worsening of right-sided weakness associated with some gait difficulty and dysarthria.  He was seen in the emergency department as a code stroke by neurology.  Work-up in the ED included an MRI has revealed multiple brain masses with some surrounding vasogenic edema    Assessment / Plan / Recommendation Clinical Impression  Pt presents with a suspected mild oropharyngeal dysphagia. Right sided oral motor deficits exhibited including right facial droop, decreased strength and ROM. This resulted in prolonged masication (pt also noted to have missing upper and lower dentition), delayed AP transit, and some right buccal increased saliva pooling. No drooling appreciated however. Per palpation swallow initiation appeared delayed as well as reduced laryngeal elevation. No overt s/sx of aspiration exhibited. Recommend regular and thin liquid diet. SLP to follow up x1 for diet tolerance.   SLP Visit Diagnosis: Dysphagia, unspecified (R13.10)    Aspiration Risk  Mild aspiration risk    Diet Recommendation   Regular, thin liquids   Medication Administration: Whole meds with liquid    Other  Recommendations Oral Care Recommendations: Oral care BID   Follow up Recommendations        Frequency and Duration min 1 x/week  1 week       Prognosis Prognosis for Safe Diet Advancement: Good      Swallow Study   General Date of Onset: 09/01/19 HPI: 78 yo male with history of hypertension type 2 diabetes, heart failure with reduced ejection fraction, tobacco use and alcohol use who presented for worsening right-sided weakness.  HPI is notable for 3 weeks of right-sided weakness for which he was referred to and seen by neurology.  An MRI had been ordered but not yet performed when he had sudden worsening of right-sided weakness associated with some gait difficulty and dysarthria.  He was seen in the emergency department as a code stroke by neurology.  Work-up in the ED included an MRI has revealed multiple brain masses with some surrounding vasogenic edema Type of Study: Bedside Swallow Evaluation Previous Swallow Assessment: none on file  Diet Prior to this Study: NPO Temperature Spikes Noted: No Respiratory Status: Room air History of Recent Intubation:  No Behavior/Cognition: Alert;Cooperative Oral Cavity Assessment: Other (comment)(some increased saliva production) Oral Care Completed by SLP: No Oral Cavity - Dentition: Missing dentition;Poor condition Vision: Functional for self-feeding Self-Feeding Abilities: Needs assist Patient Positioning: Upright in bed Baseline Vocal Quality: Low vocal intensity Volitional Swallow: Able to elicit    Oral/Motor/Sensory Function Overall Oral Motor/Sensory Function: Mild impairment Facial ROM: Reduced right;Suspected CN VII (facial) dysfunction Facial Symmetry: Abnormal symmetry right Facial Strength: Reduced right Facial Sensation: Reduced right Lingual ROM: Reduced right Velum: Within Functional Limits   Ice Chips Ice chips: Impaired Presentation: Spoon Oral Phase Impairments: Reduced labial seal Oral Phase Functional Implications: Prolonged oral transit Pharyngeal Phase Impairments: Suspected delayed Swallow;Multiple swallows;Decreased hyoid-laryngeal movement   Thin Liquid Thin Liquid: Impaired Presentation: Cup;Straw Oral Phase Impairments: Reduced labial seal Oral Phase Functional Implications: Prolonged oral transit Pharyngeal  Phase Impairments: Suspected delayed Swallow;Multiple swallows;Decreased hyoid-laryngeal movement    Nectar Thick Nectar Thick Liquid: Not tested   Honey Thick Honey Thick Liquid: Not tested   Puree Puree: Within functional limits   Solid     Solid: Impaired Presentation: Self Fed Oral Phase Impairments: Impaired mastication;Reduced lingual movement/coordination Oral Phase Functional Implications: Prolonged oral transit Pharyngeal Phase Impairments: Suspected delayed Swallow;Multiple swallows      Geoffrey Chevez E Thien Berka MA, CCC-SLP Acute Rehabilitation Services 09/02/2019,1:42 PM

## 2019-09-02 NOTE — Progress Notes (Signed)
EEG complete - results pending 

## 2019-09-02 NOTE — Consult Note (Signed)
Referring Physician: Dr Heber [place]  Patient name: Geoffrey Benjamin MRN: [id number] DOB: 09-11-1941 Sex: male  REASON FOR CONSULT: Penetrating aortic ulcer infrarenal  HPI: Geoffrey Benjamin is a 78 y.o. male, admitted with acute stroke most likely secondary to brain metastases from lung primary.  His work-up is currently in progress.  Patient does have facial droop and slurred speech but I was able to communicate with him.  As a part of his metastatic work-up with CT scan of the chest abdomen and pelvis was obtained.  This showed an ulcerated area in the infrarenal abdominal aorta.  I was called to evaluate the patient further regarding this.  He currently has no abdominal pain.  He does have some back pain but it is chronic in nature present for the last 2 to 3 years.  He has had weight loss of 10 to 12 pounds in the last month.  He has not really had any appetite. Other medical problems include ongoing evaluation for what is probably metastatic lung cancer to the brain, diabetes, hypertension, hyperlipidemia.  His hypertension hyperlipidemia and diabetes have been stable.  He is currently on aspirin and a statin.  Past Medical History:  Diagnosis Date  . Aortic stenosis 09/13/2018  . CAP (community acquired pneumonia) 09/13/2018  . CHF (congestive heart failure) ([place])   . Difficulty walking   . DM (diabetes mellitus) ([place])   . GERD 06/04/2009   Qualifier: Diagnosis of  By: Henrene Pastor MD, Docia Chuck   . Hyperlipidemia 09/13/2018  . Hypertension   . Loss of weight 09/29/2008   Qualifier: Diagnosis of  By: Bobby Rumpf CMA (AAMA), Patty    . PERSONAL HX COLONIC POLYPS 06/04/2009   Qualifier: Diagnosis of  By: Henrene Pastor MD, [place] PAIN 09/29/2008   Qualifier: Diagnosis of  By: Bobby Rumpf CMA (AAMA), Patty    . Tachycardia   . Tobacco use 09/13/2018  . Volume overload 09/13/2018   Past Surgical History:  Procedure Laterality Date  . CATARACT EXTRACTION, BILATERAL  08/2016  . DOPPLER ECHOCARDIOGRAPHY     Mitral  inflow and tissue doppler consistent with impaired LV relaxation; Trileaflet aortic vlave with moderate aortic valve stenosis, Trivial mitral and tricuspid valve regurgitation.    Family History  Problem Relation Age of Onset  . Kidney disease Father   . Stroke Mother     SOCIAL HISTORY: Social History   Socioeconomic History  . Marital status: Married    Spouse name: [name]  . Number of children: 3  . Years of education: Not on file  . Highest education level: 9th grade  Occupational History  . Occupation: RETIRED    Comment: Nurse, learning disability  . Financial resource strain: Not on file  . Food insecurity    Worry: Not on file    Inability: Not on file  . Transportation needs    Medical: Not on file    Non-medical: Not on file  Tobacco Use  . Smoking status: Current Every Day Smoker    Packs/day: 1.50    Types: Cigarettes  . Smokeless tobacco: Never Used  . Tobacco comment: SMOKED FOR 60 PLUS YEARS  Substance and Sexual Activity  . Alcohol use: Yes    Comment: occas  . Drug use: Never  . Sexual activity: Not on file  Lifestyle  . Physical activity    Days per week: Not on file    Minutes per session: Not on file  . Stress: Not  on file  Relationships  . Social Herbalist on phone: Not on file    Gets together: Not on file    Attends religious service: Not on file    Active member of club or organization: Not on file    Attends meetings of clubs or organizations: Not on file    Relationship status: Not on file  . Intimate partner violence    Fear of current or ex partner: Not on file    Emotionally abused: Not on file    Physically abused: Not on file    Forced sexual activity: Not on file  Other Topics Concern  . Not on file  Social History Narrative   Lives with wife   Caffeine-coffee 6-7 c daily    No Known Allergies  Current Facility-Administered Medications  Medication Dose Route Frequency Provider Last Rate Last Dose  .  aspirin EC tablet 81 mg  81 mg Oral Daily Bloomfield, Nila Nephew D, DO   81 mg at 09/02/19 7829  . atorvastatin (LIPITOR) tablet 80 mg  80 mg Oral Daily Bloomfield, Carley D, DO   80 mg at 09/02/19 0919  . dexamethasone (DECADRON) injection 6 mg  6 mg Intravenous Q12H Bloomfield, Carley D, DO   6 mg at 09/02/19 0919  . enoxaparin (LOVENOX) injection 40 mg  40 mg Subcutaneous Q24H Bloomfield, Carley D, DO      . insulin aspart (novoLOG) injection 0-15 Units  0-15 Units Subcutaneous TID WC Bloomfield, Carley D, DO      . insulin aspart (novoLOG) injection 0-5 Units  0-5 Units Subcutaneous QHS Bloomfield, Carley D, DO   3 Units at 09/01/19 2341  . levETIRAcetam (KEPPRA) tablet 500 mg  500 mg Oral BID Greta Doom, MD   500 mg at 09/02/19 0920  . metoprolol tartrate (LOPRESSOR) tablet 25 mg  25 mg Oral BID Bloomfield, Carley D, DO   25 mg at 09/02/19 5621  . mirtazapine (REMERON) tablet 15 mg  15 mg Oral QHS Bloomfield, Carley D, DO   15 mg at 09/01/19 2355  . nicotine (NICODERM CQ - dosed in mg/24 hours) patch 21 mg  21 mg Transdermal Daily Bloomfield, Carley D, DO   21 mg at 09/02/19 3086  . pantoprazole (PROTONIX) EC tablet 40 mg  40 mg Oral Daily Bloomfield, Carley D, DO   40 mg at 09/02/19 5784  . sodium chloride flush (NS) 0.9 % injection 3 mL  3 mL Intravenous Once Bloomfield, Carley D, DO        ROS:   General:  + weight loss, Fever, chills  HEENT: No recent headaches, no nasal bleeding, no visual changes, no sore throat  Neurologic: No dizziness, blackouts, seizures. + recent symptoms of stroke or mini- stroke. + recent episodes of slurred speech, or temporary blindness.  Cardiac: No recent episodes of chest pain/pressure, no shortness of breath at rest.  No shortness of breath with exertion.  Denies history of atrial fibrillation or irregular heartbeat  Vascular: No history of rest pain in feet.  No history of claudication.  No history of non-healing ulcer, No history of DVT    Pulmonary: No home oxygen, no productive cough, no hemoptysis,  No asthma or wheezing  Musculoskeletal:  [ ]  Arthritis, [X]  Low back pain,  [ ]  Joint pain  Hematologic:No history of hypercoagulable state.  No history of easy bleeding.  No history of anemia  Gastrointestinal: No hematochezia or melena,  No gastroesophageal reflux, no trouble  swallowing  Urinary: [ ]  chronic Kidney disease, [ ]  on HD - [ ]  MWF or [ ]  TTHS, [ ]  Burning with urination, [ ]  Frequent urination, [ ]  Difficulty urinating;   Skin: No rashes  Psychological: No history of anxiety,  No history of depression   Physical Examination  Vitals:   09/02/19 0235 09/02/19 0600 09/02/19 0742 09/02/19 0900  BP:  (!) 138/57 (!) 148/63 131/60  Pulse:  63 69 70  Resp:  18 16   Temp:  97.7 F (36.5 C) 97.7 F (36.5 C) 97.7 F (36.5 C)  TempSrc:  Oral Oral Oral  SpO2:  97% 97% 98%  Weight: 72.1 kg     Height: 6' (1.829 m)       Body mass index is 21.56 kg/m.  General:  Alert and oriented, no acute distress HEENT: Normal Neck: No JVD Pulmonary: Clear to auscultation bilaterally Cardiac: Regular Rate and Rhythm  Abdomen: Soft, non-tender, non-distended, palpable aortic pulsation not significantly enlarged  skin: No rash Extremity Pulses:  2+ radial, brachial, femoral, dorsalis pedis, posterior tibial pulses bilaterally Musculoskeletal: No deformity or edema  Neurologic: Right facial droop with slurred speech  DATA:  I reviewed the images the patient CT scan of the chest abdomen and pelvis.  Contrast timing was not optimal for the arterial side evaluation.  Patient does have an area of ulceration along the posterior aspect of the infrarenal aorta with maximum aortic diameter in that location of 3.4 cm.  There is no evidence of rupture.  ASSESSMENT: Patient with penetrating aortic ulcer.  Difficult to know time course of this.  Patient has other significant medical problems with new onset of stroke most likely  secondary to brain metastases from lung cancer.  I do not believe this aortic finding is in need of urgent treatment.  Most likely it is chronic.   PLAN: Patient should have a repeat CT scan of the abdomen and pelvis in 1 year to assess stability.  If patient has severe abdominal pain or severe acute back pain would consider repeating CT scan.  Otherwise continue to proceed with metastatic and underlying primary malignancy work-up.  X-ray findings as well as the treatment plan discussed with the patient and his wife.    Ruta Hinds, MD [facility] Office: [phone] Pager: [phone]

## 2019-09-02 NOTE — Consult Note (Addendum)
Name: Geoffrey Benjamin MRN: [id number] DOB: 07-10-1941    ADMISSION DATE:  09/01/2019 CONSULTATION DATE:  09/02/2019   REFERRING MD :  Germaine Pomfret  CHIEF COMPLAINT: Right-sided weakness, mediastinal lymphadenopathy  HISTORY OF PRESENT ILLNESS: 78 year old smoker admitted with sudden onset right-sided weakness slurred speech and right facial droop.  Head CT showed edema in the left posterior frontal and parietal lobes.  CT angiogram showed 70mm left parietal mass with vasogenic edema.  He was started on Decadron and Keppra.  MRI showed 5 brain metastases, largest was a 2.5 cm enhancing lesion in the left parietal lobe with extensive edema.  He subsequently underwent imaging of his chest and abdomen which showed bulky mediastinal lymphadenopathy and multiple pulmonary nodules.  Hence we are consulted as part of work-up for primary lung cancer with brain metastases  He denies dyspnea, cough or sputum production. He reports a 20 pound weight loss over the past 2 months.  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  has a past surgical history that includes doppler echocardiography and Cataract extraction, bilateral (08/2016). Prior to Admission medications   Medication Sig Start Date End Date Taking? Authorizing Provider  aspirin EC 81 MG tablet Take 81 mg by mouth daily.   Yes [provider]  atorvastatin (LIPITOR) 80 MG tablet Take 80 mg by mouth daily. 08/26/19  Yes [provider]  furosemide (LASIX) 40 MG tablet Take 1 tablet (40 mg total) by mouth daily. 10/12/18 10/12/19 Yes Josue Hector, MD  glipiZIDE (GLUCOTROL) 5 MG tablet Take 5 mg by mouth daily before breakfast.   Yes [provider]  lisinopril (PRINIVIL,ZESTRIL) 2.5 MG tablet Take 1 tablet (2.5 mg total) by mouth daily. Patient taking differently: Take 2.5 mg by mouth every evening.  10/12/18  Yes Josue Hector, MD  metFORMIN (GLUCOPHAGE) 1000 MG tablet Take 1 tablet (1,000 mg total) by mouth 2 (two) times daily with a meal. 09/15/18  Yes Irene Pap N, DO  metoprolol tartrate (LOPRESSOR) 25 MG tablet Take 1 tablet (25 mg total) by mouth 2 (two) times daily. 10/12/18  Yes Josue Hector, MD  mirtazapine (REMERON) 15 MG tablet Take 15 mg by mouth at bedtime. 08/21/19  Yes [provider]  Omega-3 Fatty Acids (FISH OIL) 1200 MG CAPS Take 1,200 mg by mouth daily.   Yes [provider]  pantoprazole (PROTONIX) 40 MG tablet Take 1 tablet (40 mg total) by mouth daily. Patient taking differently: Take 40 mg by mouth every evening.  10/16/18  Yes Josue Hector, MD  potassium chloride SA (K-DUR,KLOR-CON) 20 MEQ tablet Take 1 tablet (20 mEq total) by mouth daily. Patient taking differently: Take 20 mEq by mouth every evening.  10/12/18  Yes Josue Hector, MD  No Known Allergies  FAMILY HISTORY:  family history includes Kidney disease in his father; Stroke in his mother. SOCIAL HISTORY:  reports that he has been smoking cigarettes. He has been smoking about 1.50 packs per  day. He has never used smokeless tobacco. He reports current alcohol use. He reports that he does not use drugs.  REVIEW OF SYSTEMS:   Constitutional: Negative for fever, chills, malaise/fatigue and diaphoresis. Positive for weight loss HENT: Negative for hearing loss, ear pain, nosebleeds, congestion, sore throat, neck pain, tinnitus and ear discharge.   Eyes: Negative for blurred vision, double vision, photophobia, pain, discharge and redness.  Respiratory: Negative for cough, hemoptysis, sputum production, shortness of breath, wheezing and stridor.   Cardiovascular: Negative for chest pain, palpitations, orthopnea, claudication, leg swelling and PND.  Gastrointestinal: Negative for heartburn, nausea, vomiting, abdominal pain, diarrhea, constipation, blood in stool and melena.  Genitourinary: Negative for dysuria, urgency, frequency, hematuria and flank pain.  Musculoskeletal: Negative for myalgias, back pain, joint pain and falls.  Skin: Negative for itching and rash.  Neurological: Negative for dizziness, tingling, tremors, sensory change, , seizures, loss of consciousness, weakness and headaches. Positive for speech change, focal weakness as above Endo/Heme/Allergies: Negative for environmental allergies and polydipsia. Does not bruise/bleed easily.  SUBJECTIVE:   VITAL SIGNS: Temp:  [97.6 F (36.4 C)-98.1 F (36.7 C)] 97.7 F (36.5 C) (11/30 0900) Pulse Rate:  [63-81] 70 (11/30 0900) Resp:  [16-25] 16 (11/30 0742) BP: (105-148)/(57-91) 131/60 (11/30 0900) SpO2:  [94 %-98 %] 98 % (11/30 0900) Weight:  [72.1 kg] 72.1 kg (11/30 0235)  PHYSICAL EXAMINATION: Gen. Pleasant,  in no distress, normal affect ENT - no pallor,icterus, no post nasal drip, class 2-3 airway Neck: No JVD, no thyromegaly, no carotid bruits Lungs: no use of accessory muscles, no dullness to percussion, decreased without rales or rhonchi  Cardiovascular: Rhythm regular, heart sounds  normal, no murmurs or  gallops, no peripheral edema Abdomen: soft and non-tender, no hepatosplenomegaly, BS normal. Musculoskeletal: No deformities, no cyanosis or clubbing Neuro:  alert, interactive, no tremors, slurred speech, power 4/5 on right compared to 5/5 on left   Recent Labs  Lab 09/01/19 1348 09/01/19 1358 09/02/19 0218  NA 132* 133* 137  K 4.5 4.3 4.5  CL 98 96* 99  CO2 24  --  25  BUN 9 10 11   CREATININE 1.05 1.00 0.95  GLUCOSE 157* 152* 138*   Recent Labs  Lab 09/01/19 1348 09/01/19 1358 09/02/19 0218  HGB 11.8* 12.2* 11.8*  HCT 34.7* 36.0* 34.4*  WBC 11.9*  --  8.7  PLT 299  --  302   Ct Code Stroke Cta Head W/wo Contrast  Result Date: 09/01/2019 CLINICAL DATA:  Slurred speech and facial droop EXAM: CT ANGIOGRAPHY HEAD AND NECK TECHNIQUE: Multidetector CT imaging of the head and neck was performed using the standard protocol during bolus administration of intravenous contrast. Multiplanar CT image reconstructions and MIPs were obtained to evaluate the vascular anatomy. Carotid stenosis measurements (when applicable) are obtained utilizing NASCET criteria, using the distal internal carotid diameter as the denominator. CONTRAST:  42mL OMNIPAQUE IOHEXOL 350 MG/ML SOLN COMPARISON:  None. FINDINGS: CTA NECK FINDINGS Aortic arch: Extensive atherosclerotic plaque which is irregular. Three vessel branching. Right carotid system: Atheromatous wall thickening of the brachiocephalic to common carotid bifurcation. Atherosclerotic plaque at the bifurcation is moderate and mainly calcified. Right ICA origin stenosis measures 50%. Left carotid system: Atherosclerotic plaque mainly at the common carotid and ICA origins without flow  limiting stenosis or ulceration. Vertebral arteries: Extensive proximal subclavian atherosclerosis without flow limiting stenosis. Atherosclerotic plaque on the vertebral arteries on both sides without flow limiting stenosis or dissection. Skeleton: No acute or aggressive finding  Other neck: Adenopathy in the bilateral supraclavicular fossae. Upper chest: Partially covered mediastinum shows bilateral adenopathy and multiple right-sided pulmonary nodules. Review of the MIP images confirms the above findings CTA HEAD FINDINGS Anterior circulation: Diffuse atherosclerotic plaque on the carotid siphons. No branch occlusion or flow limiting stenosis. Negative for aneurysm Posterior circulation: Atherosclerotic plaque on the vertebral arteries. The vertebrobasilar arteries are smooth and widely patent. No PCA branch stenosis or occlusion. Negative for aneurysm Venous sinuses: Diffusely patent Anatomic variants: None significant Delayed phase: Delayed phase shows an avidly enhancing cortically based mass in the left high parietal lobe measuring 16 mm. A second mass is not seen. Case discussed with Dr. Leonel Ramsay who is already aware of the findings. Review of the MIP images confirms the above findings IMPRESSION: 1. 16 mm left parietal mass with extensive vasogenic edema. The mass is considered secondary to metastatic disease given adenopathy is seen in the mediastinum/bilateral supraclavicular fossae and there are right upper lobe nodules. 2. Atherosclerosis without emergent vascular finding. 3. 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 Electronically Signed   By: Monte Fantasia M.D.   On: 09/01/2019 14:25   Ct Code Stroke Cta Neck W/wo Contrast  Result Date: 09/01/2019 CLINICAL DATA:  Slurred speech and facial droop EXAM: CT ANGIOGRAPHY HEAD AND NECK TECHNIQUE: Multidetector CT imaging of the head and neck was performed using the standard protocol during bolus administration of intravenous contrast. Multiplanar CT image reconstructions and MIPs were obtained to evaluate the vascular anatomy. Carotid stenosis measurements (when applicable) are obtained utilizing NASCET criteria, using the distal internal carotid diameter as the denominator. CONTRAST:  36mL OMNIPAQUE IOHEXOL 350 MG/ML SOLN COMPARISON:   None. FINDINGS: CTA NECK FINDINGS Aortic arch: Extensive atherosclerotic plaque which is irregular. Three vessel branching. Right carotid system: Atheromatous wall thickening of the brachiocephalic to common carotid bifurcation. Atherosclerotic plaque at the bifurcation is moderate and mainly calcified. Right ICA origin stenosis measures 50%. Left carotid system: Atherosclerotic plaque mainly at the common carotid and ICA origins without flow limiting stenosis or ulceration. Vertebral arteries: Extensive proximal subclavian atherosclerosis without flow limiting stenosis. Atherosclerotic plaque on the vertebral arteries on both sides without flow limiting stenosis or dissection. Skeleton: No acute or aggressive finding Other neck: Adenopathy in the bilateral supraclavicular fossae. Upper chest: Partially covered mediastinum shows bilateral adenopathy and multiple right-sided pulmonary nodules. Review of the MIP images confirms the above findings CTA HEAD FINDINGS Anterior circulation: Diffuse atherosclerotic plaque on the carotid siphons. No branch occlusion or flow limiting stenosis. Negative for aneurysm Posterior circulation: Atherosclerotic plaque on the vertebral arteries. The vertebrobasilar arteries are smooth and widely patent. No PCA branch stenosis or occlusion. Negative for aneurysm Venous sinuses: Diffusely patent Anatomic variants: None significant Delayed phase: Delayed phase shows an avidly enhancing cortically based mass in the left high parietal lobe measuring 16 mm. A second mass is not seen. Case discussed with Dr. Leonel Ramsay who is already aware of the findings. Review of the MIP images confirms the above findings IMPRESSION: 1. 16 mm left parietal mass with extensive vasogenic edema. The mass is considered secondary to metastatic disease given adenopathy is seen in the mediastinum/bilateral supraclavicular fossae and there are right upper lobe nodules. 2. Atherosclerosis without emergent  vascular finding. 3. 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 Electronically Signed   By:  Monte Fantasia M.D.   On: 09/01/2019 14:25   Ct Chest W Contrast  Result Date: 09/02/2019 CLINICAL DATA:  Staging evaluation for multiple brain metastases. EXAM: CT CHEST, ABDOMEN, AND PELVIS WITH CONTRAST TECHNIQUE: Multidetector CT imaging of the chest, abdomen and pelvis was performed following the standard protocol during bolus administration of intravenous contrast. CONTRAST:  165mL OMNIPAQUE IOHEXOL 300 MG/ML  SOLN COMPARISON:  Chest, abdomen and pelvis exam of March of 2010. No recent cross-sectional imaging aside from neuro axis imaging is available for comparison FINDINGS: CT CHEST FINDINGS Cardiovascular: Scattered calcific and noncalcific atherosclerotic changes throughout the thoracic aorta of progressed since the previous study. Large areas of noncalcified plaque are demonstrated along the descending thoracic aorta. No signs of aneurysmal dilation. Heart size mildly enlarged with signs of mitral annular and aortic valvular calcifications. Calcified coronary artery disease. No pericardial effusion. Central pulmonary vasculature is normal. Mediastinum/Nodes: Bulky mediastinal lymphadenopathy. Extensive right paratracheal adenopathy, largest lymph node along the right paratracheal chain measuring 19 mm short axis (image 26, series 3) Small left supraclavicular lymph nodes with rounded morphology largest 1 cm (image 7, series 3) scattered small right supraclavicular lymph nodes as well. (Image 29, series 3) 19 mm left juxta hilar lymph node. Enlargement of subcarinal lymph nodes up to 2 cm in short axis as well. Lungs/Pleura: Signs of paraseptal and centrilobular emphysema. Multiple pulmonary nodules with the distribution compatible with metastatic disease. Right lower lobe pulmonary nodule (image 91, series 4) 1.3 cm. Right middle lobe nodule measuring 7 mm. 4 mm left lower lobe pulmonary nodule and at least 4 additional  pulmonary nodules in the left chest. Pleural and parenchymal distortion of posterior right chest measuring approximately 3.3 x 1.6 cm. Six right upper lobe pulmonary nodules greater than 5 mm with at least 2-3 additional small nodules in the right upper lobe. Musculoskeletal: No signs of chest wall mass. CT ABDOMEN PELVIS FINDINGS Hepatobiliary: No signs of focal hepatic lesion. Sludge, small stones in likely vicarious excretion previously administered contrast in the dependent gallbladder, no signs of pericholecystic stranding. No signs of biliary ductal dilation. Pancreas: Unremarkable. No pancreatic ductal dilatation or surrounding inflammatory changes. Spleen: Normal in size without focal abnormality. Adrenals/Urinary Tract: Normal adrenal glands. Cyst arising from upper pole left kidney. No signs of hydronephrosis. Symmetric renal enhancement. Stomach/Bowel: Small hiatal hernia. No signs of bowel obstruction or acute bowel process. Signs of colonic diverticulosis mainly in descending and sigmoid colon. Vascular/Lymphatic: Extensive calcified and noncalcified plaque in the abdominal aorta. Maximal caliber of the abdominal aorta 3.5 x 3.3 cm. 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 Major visceral branches are patent. No signs of upper abdominal adenopathy. No signs of pelvic lymphadenopathy. Reproductive: Question hyperenhancement of left hemi prostate. This could also be related to calcification within the gland, nonspecific finding overall. Other: No signs of free air or acute intra-abdominal process such as abscess or ascites. Musculoskeletal: Spinal degenerative change. No acute or destructive bone process. IMPRESSION: 1. Bulky mediastinal adenopathy in supraclavicular adenopathy with multiple pulmonary  nodules compatible with metastatic disease. 2. Process potentially related to primary lung cancer. 3. 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 This may be a chronic finding but was not present in 2010. Consider correlation  with any symptoms of pain in this area and follow-up vascular surgery consultation, more urgent if the patient is experiencing symptoms that would suggest instability of this process, i.e. acute back pain. 4. Question hyperenhancement of the left hemiprostate. This could also be related to calcification within the gland, nonspecific overall finding. Correlation with PSA may be helpful given other findings on today's scan that are compatible with metastatic disease. 5. Emphysema and aortic atherosclerosis. Aortic Atherosclerosis (ICD10-I70.0) and Emphysema (ICD10-J43.9). Electronically Signed   By: Zetta Bills M.D.   On: 09/02/2019 13:52   Mr Jeri Cos HM Contrast  Result Date: 09/01/2019 CLINICAL DATA:  Stroke follow-up. Right-sided weakness and difficulty with speech. EXAM: MRI HEAD WITHOUT AND WITH CONTRAST TECHNIQUE: Multiplanar, multiecho pulse sequences of the brain and surrounding structures were obtained without and with intravenous contrast. CONTRAST:  57mL GADAVIST GADOBUTROL 1 MMOL/ML IV SOLN COMPARISON:  CT and CTA from earlier today FINDINGS: Brain: Left frontal parietal mass with avid enhancement and extensive vasogenic edema causing local mass effect. The mass has 2 different textures with a superficial more hypointense and hypoenhancing nodule and a more elongated avidly enhancing and fluid signal component extending through the white matter. The total, maximal length of the enhancing portion is 2.5 cm. Additional enhancing lesions: 1. 3 mm along the right parietal cerebral convexity on 10:31 2. Subcentimeter in the inferior right cerebellum on 10:8 3. Subcentimeter in  the left cerebellum on 10:15. 4. Subcentimeter along the left frontal operculum on 10:26 and 12:20 This has a metastatic pattern which correlates well with the other findings on CTA of the neck. No acute infarct, acute hemorrhage, hydrocephalus, or extra-axial collection. Vascular: Preserved flow voids and vascular enhancements Skull and upper cervical spine: Negative for marrow lesion Sinuses/Orbits: Bilateral cataract resection. IMPRESSION: Five brain metastases, most notably a 2.5 cm enhancing lesion in the left parietal lobe with extensive vasogenic edema. Electronically Signed   By: Monte Fantasia M.D.   On: 09/01/2019 17:02   Ct Abdomen Pelvis W Contrast  Result Date: 09/02/2019 CLINICAL DATA:  Staging evaluation for multiple brain metastases. EXAM: CT CHEST, ABDOMEN, AND PELVIS WITH CONTRAST TECHNIQUE: Multidetector CT imaging of the chest, abdomen and pelvis was performed following the standard protocol during bolus administration of intravenous contrast. CONTRAST:  150mL OMNIPAQUE IOHEXOL 300 MG/ML  SOLN COMPARISON:  Chest, abdomen and pelvis exam of March of 2010. No recent cross-sectional imaging aside from neuro axis imaging is available for comparison FINDINGS: CT CHEST FINDINGS Cardiovascular: Scattered calcific and noncalcific atherosclerotic changes throughout the thoracic aorta of progressed since the previous study. Large areas of noncalcified plaque are demonstrated along the descending thoracic aorta. No signs of aneurysmal dilation. Heart size mildly enlarged with signs of mitral annular and aortic valvular calcifications. Calcified coronary artery disease. No pericardial effusion. Central pulmonary vasculature is normal. Mediastinum/Nodes: Bulky mediastinal lymphadenopathy. Extensive right paratracheal adenopathy, largest lymph node along the right paratracheal chain measuring 19 mm short axis (image 26, series 3) Small left supraclavicular lymph nodes with rounded morphology largest 1 cm  (image 7, series 3) scattered small right supraclavicular lymph nodes as well. (Image 29, series 3) 19 mm left juxta hilar lymph node. Enlargement of subcarinal lymph nodes up to 2 cm in short axis as well. Lungs/Pleura: Signs of paraseptal and centrilobular emphysema. Multiple pulmonary nodules with the distribution compatible with metastatic disease. Right lower lobe pulmonary nodule (image 91, series 4) 1.3 cm. Right middle lobe nodule measuring 7 mm. 4 mm left lower lobe pulmonary  nodule and at least 4 additional pulmonary nodules in the left chest. Pleural and parenchymal distortion of posterior right chest measuring approximately 3.3 x 1.6 cm. Six right upper lobe pulmonary nodules greater than 5 mm with at least 2-3 additional small nodules in the right upper lobe. Musculoskeletal: No signs of chest wall mass. CT ABDOMEN PELVIS FINDINGS Hepatobiliary: No signs of focal hepatic lesion. Sludge, small stones in likely vicarious excretion previously administered contrast in the dependent gallbladder, no signs of pericholecystic stranding. No signs of biliary ductal dilation. Pancreas: Unremarkable. No pancreatic ductal dilatation or surrounding inflammatory changes. Spleen: Normal in size without focal abnormality. Adrenals/Urinary Tract: Normal adrenal glands. Cyst arising from upper pole left kidney. No signs of hydronephrosis. Symmetric renal enhancement. Stomach/Bowel: Small hiatal hernia. No signs of bowel obstruction or acute bowel process. Signs of colonic diverticulosis mainly in descending and sigmoid colon. Vascular/Lymphatic: Extensive calcified and noncalcified plaque in the abdominal aorta. Maximal caliber of the abdominal aorta 3.5 x 3.3 cm. 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 Major visceral  branches are patent. No signs of upper abdominal adenopathy. No signs of pelvic lymphadenopathy. Reproductive: Question hyperenhancement of left hemi prostate. This could also be related to calcification within the gland, nonspecific finding overall. Other: No signs of free air or acute intra-abdominal process such as abscess or ascites. Musculoskeletal: Spinal degenerative change. No acute or destructive bone process. IMPRESSION: 1. Bulky mediastinal adenopathy in supraclavicular adenopathy with multiple pulmonary nodules compatible with metastatic disease. 2. Process potentially related to primary lung cancer. 3. 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 This may be a chronic finding but was not present in 2010. Consider correlation with any symptoms of pain in this area and follow-up vascular surgery consultation, more urgent if the patient is experiencing symptoms that would suggest instability of this process, i.e. acute back pain. 4. Question hyperenhancement of the left hemiprostate. This could also be related to calcification within the gland, nonspecific overall finding. Correlation with PSA may be helpful given other findings on today's scan that are compatible with metastatic disease. 5. Emphysema and aortic atherosclerosis. Aortic Atherosclerosis (ICD10-I70.0) and Emphysema (ICD10-J43.9). Electronically Signed   By: Zetta Bills M.D.   On: 09/02/2019 13:52   Ct Head Code Stroke Wo Contrast  Result Date: 09/01/2019 CLINICAL DATA:  Code stroke. EXAM: CT HEAD WITHOUT CONTRAST TECHNIQUE: Contiguous axial images were obtained from the base of the skull through the vertex without intravenous contrast. COMPARISON:  None. FINDINGS: Brain: Extensive edema in the left posterior frontal and parietal hemisphere with gray matter sparing, cytotoxic appearing. A discrete mass is not clearly seen. No  hemorrhage, hydrocephalus, or collection. Vascular: Atherosclerotic calcification.  No hyperdense vessel Skull: No acute finding. Periodontal erosions which are partially covered. Sinuses/Orbits: Bilateral cataract resection. Pending direct communication with Neurology team. ASPECTS [facility] Stroke Program Early CT Score) Not scored in this setting IMPRESSION: Extensive edema in the left posterior frontal and parietal lobes with vasogenic appearance suggesting underlying mass or inflammation/infection. Electronically Signed   By: Monte Fantasia M.D.   On: 09/01/2019 14:15    ASSESSMENT / PLAN:   Multiple pulmonary nodules Bulky mediastinal lymphadenopathy-especially subcarinal and right paratracheal Left supraclavicular lymphadenopathy Brain metastases Active smoker/emphysema  -Imaging is  concerning for primary lung malignancy  Recommend-simplest biopsy target would be ultrasound-guided FNA of left supraclavicular lymph node by IR. If for some reason this is not possible, then would suggest bronchoscopy with EBUS and transbronchial needle aspiration of right paratracheal and subcarinal lymph nodes  For brain metastases, agree with IV dexamethasone and proceed with radiation oncology consult while awaiting biopsy  Kara Mead MD. FCCP. [facility]  If no response to pager , please call [phone]    09/02/2019, 4:05 PM

## 2019-09-02 NOTE — Progress Notes (Signed)
Spoke with Dr.Cram with neurosurgery regarding brain mass. Mentions that he has reviewed imaging and likely would be high risk for surgical resection. Recommend continuing oncology work-up first.

## 2019-09-02 NOTE — Procedures (Signed)
Patient Name: Geoffrey Benjamin  MRN: [id number]  Epilepsy Attending: Lora Havens  Referring Physician/Provider: Dr. 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  EEG to evaluate for seizures.  Level of alertness: Awake, asleep  AEDs during EEG study: Keppra  Technical aspects: This EEG study was done with scalp electrodes positioned according to the 10-20 International system of electrode placement. Electrical activity was acquired at a sampling rate of 500Hz  and reviewed with a high frequency filter of 70Hz  and a low frequency filter of 1Hz . EEG data were recorded continuously and digitally stored.   Description: During awake state, the posterior dominant rhythm consists of 9-10 Hz activity of moderate voltage (25-35 uV) seen predominantly in posterior head regions, symmetric and reactive to eye opening and eye closing.  Sleep was characterized by vertex waves, sleep spindles (12 to 14 Hz), maximal frontocentral.  EEG showed continuous generalized 3 to 5 Hz theta-delta slowing in left frontotemporal region.  Sharp wave was also seen in left frontotemporal region, maximal T3. Hyperventilation and photic stimulation were not performed.  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  No seizures were seen throughout the recording.

## 2019-09-02 NOTE — Progress Notes (Signed)
   Subjective:   Patient resting in bed.  Discussed his MRI findings  Discussed with his wife later on this morning.  No acute changes in his symptoms since yesterday.   Objective:  Vital signs in last 24 hours: Vitals:   09/02/19 0219 09/02/19 0235 09/02/19 0600 09/02/19 0742  BP:   (!) 138/57 (!) 148/63  Pulse: 65  63 69  Resp:   18 16  Temp: 97.6 F (36.4 C)  97.7 F (36.5 C) 97.7 F (36.5 C)  TempSrc: Oral  Oral Oral  SpO2:   97% 97%  Weight:  72.1 kg    Height:  6' (1.829 m)     General:NAD resting in bed CV: regular rate and rythm Pulm: Non labored Abd: Neuro: dysarthria, expressive aphasia, 4/5 RUE and 4/5 RLE weakness  Assessment/Plan:  Active Problems:   Right sided weakness  Geoffrey Benjamin 78 year old gentleman with history of tobacco use, alcohol use, HTN, TTE DM, and 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  #Metastatic brain masses Discussed MRI findings with patient this morning. Neck showed bilateral adenopathy with multiple right-sided pulmonary nodules.  Plan for CT chest abdomen pelvis today.   Neurology following appreciate the recommendations -Continue Keppra 500 mg twice daily -Continue Decadron 6 mg twice daily -CT chest abdomen pelvis - Nonemergent neurosurgery consult  #HTN Home meds lisinopril metoprolol Continue metoprolol  #HFrEF Continue home aspirin and statin Holding Lasix  #T2DM Hold home meds glipizide and Metformin -SSI   Dispo: Anticipated discharge in 2-3 days. Tamsen Snider, MD PGY1  [phone]

## 2019-09-02 NOTE — Consult Note (Signed)
Radiation Oncology         [phone] ________________________________  Name: Geoffrey Benjamin        MRN: [id number]  Date of Service: 09/02/2019  DOB: 10/01/1941  CC:Clyde Lundborg J, PA-C  No ref. provider found     REFERRING PHYSICIAN: Dr. Heber [place]  DIAGNOSIS: The primary encounter diagnosis was Weakness. A diagnosis of Brain mass was also pertinent to this visit.   HISTORY OF PRESENT ILLNESS: Geoffrey Benjamin is a 78 y.o. male seen at the request of Dr. 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 This patient presented was having right sided weakness for a few weeks. He was getting set up in the outpatient setting for an MRI but was brought in on 09/01/2019 via EMS with stroke like symptoms with right sided arm weakness, facial droop and CT imaging did not identify CVA, rather a 16 mm left parietal mass with extensive vasogenic edema. He did have a 50% right ICA origin stenosis in the CTA of the neck. An MRI of the brain revealed concern for a 2.5 cm enhancing lesion in the left frontoparietal region with a 3 mm right parietal cerebral lesion, and three other subcentimeter lesions in the right cerebellum, left cerebellum, and left frontal operculum. The primary lesion had significant vasogenic edema. He was started on dexamethasone at 10 mg, and is currently receiving 6 mg IV BID. Given the findings a CT scan of the CAP today revealed bulky mediastinal adenopathy, small left supraclavicular adenopathy, and bilateral lung nodules, the largest nodule was in the RLL measuring 1.3 cm, and pleural and parenchymal distortion in the right posterior chest was also seen measuring 3.3 x 1.6 cm. He also had an aortic hematoma distal to the level of the kidneys. He has been seen by vascular surgery and no planned intervention is recommended for the hematoma. He has a pending pulmonary consult to consider bronchoscopy, and we are asked to consult to consider radiotherapy to the  brain.     PREVIOUS RADIATION THERAPY: No   PAST MEDICAL HISTORY:  Past Medical History:  Diagnosis Date  . Aortic stenosis 09/13/2018  . CAP (community acquired pneumonia) 09/13/2018  . CHF (congestive heart failure) ([place])   . Difficulty walking   . DM (diabetes mellitus) ([place])   . GERD 06/04/2009   Qualifier: Diagnosis of  By: Henrene Pastor MD, Docia Chuck   . Hyperlipidemia 09/13/2018  . Hypertension   . Loss of weight 09/29/2008   Qualifier: Diagnosis of  By: Bobby Rumpf CMA (AAMA), Patty    . PERSONAL HX COLONIC POLYPS 06/04/2009   Qualifier: Diagnosis of  By: Henrene Pastor MD, [place] PAIN 09/29/2008   Qualifier: Diagnosis of  By: Bobby Rumpf CMA (AAMA), Patty    . Tachycardia   . Tobacco use 09/13/2018  . Volume overload 09/13/2018       PAST SURGICAL HISTORY: Past Surgical History:  Procedure Laterality Date  . CATARACT EXTRACTION, BILATERAL  08/2016  . DOPPLER ECHOCARDIOGRAPHY     Mitral inflow and tissue doppler consistent with impaired LV relaxation; Trileaflet aortic vlave with moderate aortic valve stenosis, Trivial mitral and tricuspid valve regurgitation.     FAMILY HISTORY:  Family History  Problem Relation Age of Onset  . Kidney disease Father   . Stroke Mother      SOCIAL HISTORY:  reports that he has been smoking cigarettes. He has been smoking about 1.50 packs per day. He has never used smokeless  tobacco. He reports current alcohol use. He reports that he does not use drugs. The patient is married and lives in [place]. His wife accompanies him on the call.    ALLERGIES: Patient has no known allergies.   MEDICATIONS:  Current Facility-Administered Medications  Medication Dose Route Frequency Provider Last Rate Last Dose  . aspirin EC tablet 81 mg  81 mg Oral Daily Bloomfield, Nila Nephew D, DO   81 mg at 09/02/19 8338  . atorvastatin (LIPITOR) tablet 80 mg  80 mg Oral Daily Bloomfield, Carley D, DO   80 mg at 09/02/19 0919  . dexamethasone (DECADRON) injection 6 mg  6 mg  Intravenous Q12H Bloomfield, Carley D, DO   6 mg at 09/02/19 0919  . enoxaparin (LOVENOX) injection 40 mg  40 mg Subcutaneous Q24H Bloomfield, Carley D, DO      . insulin aspart (novoLOG) injection 0-15 Units  0-15 Units Subcutaneous TID WC Bloomfield, Carley D, DO      . insulin aspart (novoLOG) injection 0-5 Units  0-5 Units Subcutaneous QHS Bloomfield, Carley D, DO   3 Units at 09/01/19 2341  . levETIRAcetam (KEPPRA) tablet 500 mg  500 mg Oral BID Greta Doom, MD   500 mg at 09/02/19 0920  . metoprolol tartrate (LOPRESSOR) tablet 25 mg  25 mg Oral BID Bloomfield, Carley D, DO   25 mg at 09/02/19 2505  . mirtazapine (REMERON) tablet 15 mg  15 mg Oral QHS Bloomfield, Carley D, DO   15 mg at 09/01/19 2355  . nicotine (NICODERM CQ - dosed in mg/24 hours) patch 21 mg  21 mg Transdermal Daily Bloomfield, Carley D, DO   21 mg at 09/02/19 3976  . pantoprazole (PROTONIX) EC tablet 40 mg  40 mg Oral Daily Bloomfield, Carley D, DO   40 mg at 09/02/19 7341  . sodium chloride flush (NS) 0.9 % injection 3 mL  3 mL Intravenous Once Bloomfield, Carley D, DO         REVIEW OF SYSTEMS: On review of systems, the patient reports that his weakness has improved and his facial drooping and speech is also improved since his hospitalization, presumably his steroids have aided in this. He has had some mild headaches intermittently in the recent past. He  denies any chest pain, shortness of breath, cough, fevers, chills, night sweats. He has had about 6 pounds of unintended weight changes in the past few months. He denies any bowel or bladder disturbances, and denies abdominal pain, nausea or vomiting. He denies any new musculoskeletal or joint aches or pains. A complete review of systems is obtained and is otherwise negative.     PHYSICAL EXAM:  Wt Readings from Last 3 Encounters:  09/02/19 158 lb 15.2 oz (72.1 kg)  08/27/19 163 lb (73.9 kg)  12/20/18 173 lb 9.6 oz (78.7 kg)   Temp Readings from Last 3  Encounters:  09/02/19 97.7 F (36.5 C) (Oral)  08/27/19 97.6 F (36.4 C)  09/15/18 (!) 97.4 F (36.3 C) (Oral)   BP Readings from Last 3 Encounters:  09/02/19 131/60  08/27/19 100/66  12/20/18 114/70   Pulse Readings from Last 3 Encounters:  09/02/19 70  08/27/19 (!) 106  12/20/18 81   Pain Assessment Pain Score: 0-No pain/10 Unable to assess due to encounter type.  ECOG = 3  0 - Asymptomatic (Fully active, able to carry on all predisease activities without restriction)  1 - Symptomatic but completely ambulatory (Restricted in physically strenuous activity but ambulatory and  able to carry out work of a light or sedentary nature. For example, light housework, office work)  2 - Symptomatic, <50% in bed during the day (Ambulatory and capable of all self care but unable to carry out any work activities. Up and about more than 50% of waking hours)  3 - Symptomatic, >50% in bed, but not bedbound (Capable of only limited self-care, confined to bed or chair 50% or more of waking hours)  4 - Bedbound (Completely disabled. Cannot carry on any self-care. Totally confined to bed or chair)  5 - Death   Eustace Pen MM, Creech RH, Tormey DC, et al. [phone]). "Toxicity and response criteria of the [facility]". [place] Oncol. 5 (6): 649-55    LABORATORY DATA:  Lab Results  Component Value Date   WBC 8.7 09/02/2019   HGB 11.8 (L) 09/02/2019   HCT 34.4 (L) 09/02/2019   MCV 91.0 09/02/2019   PLT 302 09/02/2019   Lab Results  Component Value Date   NA 137 09/02/2019   K 4.5 09/02/2019   CL 99 09/02/2019   CO2 25 09/02/2019   Lab Results  Component Value Date   ALT 15 09/01/2019   AST 15 09/01/2019   ALKPHOS 51 09/01/2019   BILITOT 0.5 09/01/2019      RADIOGRAPHY: Ct Code Stroke Cta Head W/wo Contrast  Result Date: 09/01/2019 CLINICAL DATA:  Slurred speech and facial droop EXAM: CT ANGIOGRAPHY HEAD AND NECK TECHNIQUE: Multidetector CT imaging of the  head and neck was performed using the standard protocol during bolus administration of intravenous contrast. Multiplanar CT image reconstructions and MIPs were obtained to evaluate the vascular anatomy. Carotid stenosis measurements (when applicable) are obtained utilizing NASCET criteria, using the distal internal carotid diameter as the denominator. CONTRAST:  36mL OMNIPAQUE IOHEXOL 350 MG/ML SOLN COMPARISON:  None. FINDINGS: CTA NECK FINDINGS Aortic arch: Extensive atherosclerotic plaque which is irregular. Three vessel branching. Right carotid system: Atheromatous wall thickening of the brachiocephalic to common carotid bifurcation. Atherosclerotic plaque at the bifurcation is moderate and mainly calcified. Right ICA origin stenosis measures 50%. Left carotid system: Atherosclerotic plaque mainly at the common carotid and ICA origins without flow limiting stenosis or ulceration. Vertebral arteries: Extensive proximal subclavian atherosclerosis without flow limiting stenosis. Atherosclerotic plaque on the vertebral arteries on both sides without flow limiting stenosis or dissection. Skeleton: No acute or aggressive finding Other neck: Adenopathy in the bilateral supraclavicular fossae. Upper chest: Partially covered mediastinum shows bilateral adenopathy and multiple right-sided pulmonary nodules. Review of the MIP images confirms the above findings CTA HEAD FINDINGS Anterior circulation: Diffuse atherosclerotic plaque on the carotid siphons. No branch occlusion or flow limiting stenosis. Negative for aneurysm Posterior circulation: Atherosclerotic plaque on the vertebral arteries. The vertebrobasilar arteries are smooth and widely patent. No PCA branch stenosis or occlusion. Negative for aneurysm Venous sinuses: Diffusely patent Anatomic variants: None significant Delayed phase: Delayed phase shows an avidly enhancing cortically based mass in the left high parietal lobe measuring 16 mm. A second mass is not  seen. Case discussed with Dr. Leonel Ramsay who is already aware of the findings. Review of the MIP images confirms the above findings IMPRESSION: 1. 16 mm left parietal mass with extensive vasogenic edema. The mass is considered secondary to metastatic disease given adenopathy is seen in the mediastinum/bilateral supraclavicular fossae and there are right upper lobe nodules. 2. Atherosclerosis without emergent vascular finding. 3. 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 Electronically Signed   By: Neva Seat.D.  On: 09/01/2019 14:25   Ct Code Stroke Cta Neck W/wo Contrast  Result Date: 09/01/2019 CLINICAL DATA:  Slurred speech and facial droop EXAM: CT ANGIOGRAPHY HEAD AND NECK TECHNIQUE: Multidetector CT imaging of the head and neck was performed using the standard protocol during bolus administration of intravenous contrast. Multiplanar CT image reconstructions and MIPs were obtained to evaluate the vascular anatomy. Carotid stenosis measurements (when applicable) are obtained utilizing NASCET criteria, using the distal internal carotid diameter as the denominator. CONTRAST:  40mL OMNIPAQUE IOHEXOL 350 MG/ML SOLN COMPARISON:  None. FINDINGS: CTA NECK FINDINGS Aortic arch: Extensive atherosclerotic plaque which is irregular. Three vessel branching. Right carotid system: Atheromatous wall thickening of the brachiocephalic to common carotid bifurcation. Atherosclerotic plaque at the bifurcation is moderate and mainly calcified. Right ICA origin stenosis measures 50%. Left carotid system: Atherosclerotic plaque mainly at the common carotid and ICA origins without flow limiting stenosis or ulceration. Vertebral arteries: Extensive proximal subclavian atherosclerosis without flow limiting stenosis. Atherosclerotic plaque on the vertebral arteries on both sides without flow limiting stenosis or dissection. Skeleton: No acute or aggressive finding Other neck: Adenopathy in the bilateral supraclavicular fossae. Upper  chest: Partially covered mediastinum shows bilateral adenopathy and multiple right-sided pulmonary nodules. Review of the MIP images confirms the above findings CTA HEAD FINDINGS Anterior circulation: Diffuse atherosclerotic plaque on the carotid siphons. No branch occlusion or flow limiting stenosis. Negative for aneurysm Posterior circulation: Atherosclerotic plaque on the vertebral arteries. The vertebrobasilar arteries are smooth and widely patent. No PCA branch stenosis or occlusion. Negative for aneurysm Venous sinuses: Diffusely patent Anatomic variants: None significant Delayed phase: Delayed phase shows an avidly enhancing cortically based mass in the left high parietal lobe measuring 16 mm. A second mass is not seen. Case discussed with Dr. Leonel Ramsay who is already aware of the findings. Review of the MIP images confirms the above findings IMPRESSION: 1. 16 mm left parietal mass with extensive vasogenic edema. The mass is considered secondary to metastatic disease given adenopathy is seen in the mediastinum/bilateral supraclavicular fossae and there are right upper lobe nodules. 2. Atherosclerosis without emergent vascular finding. 3. 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 Electronically Signed   By: Monte Fantasia M.D.   On: 09/01/2019 14:25   Ct Chest W Contrast  Result Date: 09/02/2019 CLINICAL DATA:  Staging evaluation for multiple brain metastases. EXAM: CT CHEST, ABDOMEN, AND PELVIS WITH CONTRAST TECHNIQUE: Multidetector CT imaging of the chest, abdomen and pelvis was performed following the standard protocol during bolus administration of intravenous contrast. CONTRAST:  116mL OMNIPAQUE IOHEXOL 300 MG/ML  SOLN COMPARISON:  Chest, abdomen and pelvis exam of March of 2010. No recent cross-sectional imaging aside from neuro axis imaging is available for comparison FINDINGS: CT CHEST FINDINGS Cardiovascular: Scattered calcific and noncalcific atherosclerotic changes throughout the thoracic aorta of  progressed since the previous study. Large areas of noncalcified plaque are demonstrated along the descending thoracic aorta. No signs of aneurysmal dilation. Heart size mildly enlarged with signs of mitral annular and aortic valvular calcifications. Calcified coronary artery disease. No pericardial effusion. Central pulmonary vasculature is normal. Mediastinum/Nodes: Bulky mediastinal lymphadenopathy. Extensive right paratracheal adenopathy, largest lymph node along the right paratracheal chain measuring 19 mm short axis (image 26, series 3) Small left supraclavicular lymph nodes with rounded morphology largest 1 cm (image 7, series 3) scattered small right supraclavicular lymph nodes as well. (Image 29, series 3) 19 mm left juxta hilar lymph node. Enlargement of subcarinal lymph nodes up to 2 cm in short axis as  well. Lungs/Pleura: Signs of paraseptal and centrilobular emphysema. Multiple pulmonary nodules with the distribution compatible with metastatic disease. Right lower lobe pulmonary nodule (image 91, series 4) 1.3 cm. Right middle lobe nodule measuring 7 mm. 4 mm left lower lobe pulmonary nodule and at least 4 additional pulmonary nodules in the left chest. Pleural and parenchymal distortion of posterior right chest measuring approximately 3.3 x 1.6 cm. Six right upper lobe pulmonary nodules greater than 5 mm with at least 2-3 additional small nodules in the right upper lobe. Musculoskeletal: No signs of chest wall mass. CT ABDOMEN PELVIS FINDINGS Hepatobiliary: No signs of focal hepatic lesion. Sludge, small stones in likely vicarious excretion previously administered contrast in the dependent gallbladder, no signs of pericholecystic stranding. No signs of biliary ductal dilation. Pancreas: Unremarkable. No pancreatic ductal dilatation or surrounding inflammatory changes. Spleen: Normal in size without focal abnormality. Adrenals/Urinary Tract: Normal adrenal glands. Cyst arising from upper pole left  kidney. No signs of hydronephrosis. Symmetric renal enhancement. Stomach/Bowel: Small hiatal hernia. No signs of bowel obstruction or acute bowel process. Signs of colonic diverticulosis mainly in descending and sigmoid colon. Vascular/Lymphatic: Extensive calcified and noncalcified plaque in the abdominal aorta. Maximal caliber of the abdominal aorta 3.5 x 3.3 cm. 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 Major visceral branches are patent. No signs of upper abdominal adenopathy. No signs of pelvic lymphadenopathy. Reproductive: Question hyperenhancement of left hemi prostate. This could also be related to calcification within the gland, nonspecific finding overall. Other: No signs of free air or acute intra-abdominal process such as abscess or ascites. Musculoskeletal: Spinal degenerative change. No acute or destructive bone process. IMPRESSION: 1. Bulky mediastinal adenopathy in supraclavicular adenopathy with multiple pulmonary nodules compatible with metastatic disease. 2. Process potentially related to primary lung cancer. 3. 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 This may be a chronic finding but was not present in 2010. Consider correlation with any symptoms of pain in this area and follow-up vascular surgery consultation, more urgent if the patient is experiencing symptoms that would suggest instability of this process, i.e. acute back pain. 4. Question hyperenhancement of the left hemiprostate. This could also be related to calcification within the gland, nonspecific overall finding. Correlation with PSA may be helpful given other findings on today's scan that are compatible with metastatic  disease. 5. Emphysema and aortic atherosclerosis. Aortic Atherosclerosis (ICD10-I70.0) and Emphysema (ICD10-J43.9). Electronically Signed   By: Zetta Bills M.D.   On: 09/02/2019 13:52   Mr Jeri Cos BZ Contrast  Result Date: 09/01/2019 CLINICAL DATA:  Stroke follow-up. Right-sided weakness and difficulty with speech. EXAM: MRI HEAD WITHOUT AND WITH CONTRAST TECHNIQUE: Multiplanar, multiecho pulse sequences of the brain and surrounding structures were obtained without and with intravenous contrast. CONTRAST:  67mL GADAVIST GADOBUTROL 1 MMOL/ML IV SOLN COMPARISON:  CT and CTA from earlier today FINDINGS: Brain: Left frontal parietal mass with avid enhancement and extensive vasogenic edema causing local mass effect. The mass has 2 different textures with a superficial more hypointense and hypoenhancing nodule and a more elongated avidly enhancing and fluid signal component extending through the white matter. The total, maximal length of the enhancing portion is 2.5 cm. Additional enhancing lesions: 1. 3 mm along the right parietal  cerebral convexity on 10:31 2. Subcentimeter in the inferior right cerebellum on 10:8 3. Subcentimeter in the left cerebellum on 10:15. 4. Subcentimeter along the left frontal operculum on 10:26 and 12:20 This has a metastatic pattern which correlates well with the other findings on CTA of the neck. No acute infarct, acute hemorrhage, hydrocephalus, or extra-axial collection. Vascular: Preserved flow voids and vascular enhancements Skull and upper cervical spine: Negative for marrow lesion Sinuses/Orbits: Bilateral cataract resection. IMPRESSION: Five brain metastases, most notably a 2.5 cm enhancing lesion in the left parietal lobe with extensive vasogenic edema. Electronically Signed   By: Monte Fantasia M.D.   On: 09/01/2019 17:02   Ct Abdomen Pelvis W Contrast  Result Date: 09/02/2019 CLINICAL DATA:  Staging evaluation for multiple brain metastases. EXAM: CT CHEST, ABDOMEN, AND  PELVIS WITH CONTRAST TECHNIQUE: Multidetector CT imaging of the chest, abdomen and pelvis was performed following the standard protocol during bolus administration of intravenous contrast. CONTRAST:  160mL OMNIPAQUE IOHEXOL 300 MG/ML  SOLN COMPARISON:  Chest, abdomen and pelvis exam of March of 2010. No recent cross-sectional imaging aside from neuro axis imaging is available for comparison FINDINGS: CT CHEST FINDINGS Cardiovascular: Scattered calcific and noncalcific atherosclerotic changes throughout the thoracic aorta of progressed since the previous study. Large areas of noncalcified plaque are demonstrated along the descending thoracic aorta. No signs of aneurysmal dilation. Heart size mildly enlarged with signs of mitral annular and aortic valvular calcifications. Calcified coronary artery disease. No pericardial effusion. Central pulmonary vasculature is normal. Mediastinum/Nodes: Bulky mediastinal lymphadenopathy. Extensive right paratracheal adenopathy, largest lymph node along the right paratracheal chain measuring 19 mm short axis (image 26, series 3) Small left supraclavicular lymph nodes with rounded morphology largest 1 cm (image 7, series 3) scattered small right supraclavicular lymph nodes as well. (Image 29, series 3) 19 mm left juxta hilar lymph node. Enlargement of subcarinal lymph nodes up to 2 cm in short axis as well. Lungs/Pleura: Signs of paraseptal and centrilobular emphysema. Multiple pulmonary nodules with the distribution compatible with metastatic disease. Right lower lobe pulmonary nodule (image 91, series 4) 1.3 cm. Right middle lobe nodule measuring 7 mm. 4 mm left lower lobe pulmonary nodule and at least 4 additional pulmonary nodules in the left chest. Pleural and parenchymal distortion of posterior right chest measuring approximately 3.3 x 1.6 cm. Six right upper lobe pulmonary nodules greater than 5 mm with at least 2-3 additional small nodules in the right upper lobe.  Musculoskeletal: No signs of chest wall mass. CT ABDOMEN PELVIS FINDINGS Hepatobiliary: No signs of focal hepatic lesion. Sludge, small stones in likely vicarious excretion previously administered contrast in the dependent gallbladder, no signs of pericholecystic stranding. No signs of biliary ductal dilation. Pancreas: Unremarkable. No pancreatic ductal dilatation or surrounding inflammatory changes. Spleen: Normal in size without focal abnormality. Adrenals/Urinary Tract: Normal adrenal glands. Cyst arising from upper pole left kidney. No signs of hydronephrosis. Symmetric renal enhancement. Stomach/Bowel: Small hiatal hernia. No signs of bowel obstruction or acute bowel process. Signs of colonic diverticulosis mainly in descending and sigmoid colon. Vascular/Lymphatic: Extensive calcified and noncalcified plaque in the abdominal aorta. Maximal caliber of the abdominal aorta 3.5 x 3.3 cm. 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 Major visceral branches are patent. No signs of upper abdominal adenopathy. No signs of pelvic lymphadenopathy. Reproductive:  Question hyperenhancement of left hemi prostate. This could also be related to calcification within the gland, nonspecific finding overall. Other: No signs of free air or acute intra-abdominal process such as abscess or ascites. Musculoskeletal: Spinal degenerative change. No acute or destructive bone process. IMPRESSION: 1. Bulky mediastinal adenopathy in supraclavicular adenopathy with multiple pulmonary nodules compatible with metastatic disease. 2. Process potentially related to primary lung cancer. 3. 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 This may  be a chronic finding but was not present in 2010. Consider correlation with any symptoms of pain in this area and follow-up vascular surgery consultation, more urgent if the patient is experiencing symptoms that would suggest instability of this process, i.e. acute back pain. 4. Question hyperenhancement of the left hemiprostate. This could also be related to calcification within the gland, nonspecific overall finding. Correlation with PSA may be helpful given other findings on today's scan that are compatible with metastatic disease. 5. Emphysema and aortic atherosclerosis. Aortic Atherosclerosis (ICD10-I70.0) and Emphysema (ICD10-J43.9). Electronically Signed   By: Zetta Bills M.D.   On: 09/02/2019 13:52   Ct Head Code Stroke Wo Contrast  Result Date: 09/01/2019 CLINICAL DATA:  Code stroke. EXAM: CT HEAD WITHOUT CONTRAST TECHNIQUE: Contiguous axial images were obtained from the base of the skull through the vertex without intravenous contrast. COMPARISON:  None. FINDINGS: Brain: Extensive edema in the left posterior frontal and parietal hemisphere with gray matter sparing, cytotoxic appearing. A discrete mass is not clearly seen. No hemorrhage, hydrocephalus, or collection. Vascular: Atherosclerotic calcification.  No hyperdense vessel Skull: No acute finding. Periodontal erosions which are partially covered. Sinuses/Orbits: Bilateral cataract resection. Pending direct communication with Neurology team. ASPECTS [facility] Stroke Program Early CT Score) Not scored in this setting IMPRESSION: Extensive edema in the left posterior frontal and parietal lobes with vasogenic appearance suggesting underlying mass or inflammation/infection. Electronically Signed   By: Monte Fantasia M.D.   On: 09/01/2019 14:15       IMPRESSION/PLAN: 1. 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 I reviewed the findings from the patient's MRI and CT scan images. He appears to have a lung cancer with  metastatic disease to the brain. I discussed the rationale to proceed with continued work up. Dr. Elsworth Soho is planning to proceed with bronchoscopy tomorrow. Hopefully we will have results by Thursday. I let him know the rationale to proceed with radiotherapy for brain disease. He is in agreement with a 3T MRI because he may be a candidate for stereotactic radiosurgery [facility]). He has also been seen by Dr. Saintclair Halsted and surgery does not appear to be a good option for him. If he is a candidate for SRS we could coordinate this in the outpatient setting. Once Dr. Lisbeth Renshaw has had a chance to evaluate his 3T results, we will determine if he is a candidate, and if so whether it would be a single or fractionated scheme for his treatment. We discussed the risks, benefits, short, and long term effects of radiotherapy, and the patient is interested in proceeding but wishes to have more conversation tomorrow about the plan. I've also contacted Mont Dutton, RT our brain oncology navigator so she is aware of this patient as well.  2. Steroid Dosing. The patient is currently receiving 6 mg BID. I will switch his dose to 4 mg TID po. Hopefully we can soon taper his steroids. He is on PPI for prophylaxis  as well and will need to remain on this until his steroids are discontinued in the outpatient setting. 3. Seizure activity. We appreciate the recommendations by neurology. We will follow along expectantly with continued Keppra. 4. 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 The patient will be followed for this but per vascular surgery no intervention is planned.  In a visit lasting 70 minutes, greater than 50% of the time was spent by phone discussing his case, and in floor time coordinating the patient's care.      Carola Rhine, PAC

## 2019-09-02 NOTE — Evaluation (Signed)
Physical Therapy Evaluation Patient Details Name: Geoffrey Benjamin MRN: [id number] DOB: 12-27-1940 Today's Date: 09/02/2019   History of Present Illness  78 year old smoker admitted with sudden onset right-sided weakness slurred speech and right facial droop.  Head CT showed edema in the left posterior frontal and parietal lobes.  CT angiogram showed 43mm left parietal mass with vasogenic edema.  MRI showed 5 brain metastases, largest was a 2.5 cm enhancing lesion in the left parietal lobe with extensive edema.  And imaging of his chest and abdomen which showed bulky mediastinal lymphadenopathy and multiple pulmonary nodules.  Clinical Impression  Patient presents with decreased mobility due to weakness on the R UE>LE, decreased balance and safety awareness.  Currently min a for mobility and will benefit from skilled PT in the acute setting to maximize mobility and allow d/c home with family support and follow up HHPT.     Follow Up Recommendations Home health PT;Supervision/Assistance - 24 hour(HH aide)    Equipment Recommendations  Rolling walker with 5" wheels;3in1 (PT);Other (comment)(? tub bench)    Recommendations for Other Services       Precautions / Restrictions Precautions Precautions: Fall      Mobility  Bed Mobility Overal bed mobility: Needs Assistance Bed Mobility: Supine to Sit     Supine to sit: Min assist;HOB elevated     General bed mobility comments: to scoot hips oue  Transfers Overall transfer level: Needs assistance Equipment used: Rolling walker (2 wheeled) Transfers: Sit to/from Stand Sit to Stand: Mod assist         General transfer comment: some lifting help, but pt pulling up on walker  Ambulation/Gait Ambulation/Gait assistance: Min assist Gait Distance (Feet): 25 Feet Assistive device: Rolling walker (2 wheeled) Gait Pattern/deviations: Step-to pattern;Trunk flexed;Decreased stride length     General Gait Details: walker in the room was  youth walker so pt stayed flexed, assist for balance, but no buckling noted R LE, assist to keep R hand on walker  Stairs            Wheelchair Mobility    Modified Rankin (Stroke Patients Only)       Balance Overall balance assessment: Needs assistance   Sitting balance-Leahy Scale: Good       Standing balance-Leahy Scale: Fair Standing balance comment: can stand momentarily without UE support, needs S for safety                             Pertinent Vitals/Pain Pain Assessment: No/denies pain    Home Living Family/patient expects to be discharged to:: Private residence Living Arrangements: Spouse/significant other Available Help at Discharge: Family Type of Home: House Home Access: Stairs to enter Entrance Stairs-Rails: Right Entrance Stairs-Number of Steps: 4 Home Layout: One level Home Equipment: None      Prior Function Level of Independence: Independent               Hand Dominance        Extremity/Trunk Assessment   Upper Extremity Assessment Upper Extremity Assessment: RUE deficits/detail RUE Deficits / Details: sulcus sign at shoulder, AAROM WFL, shoulder elevation strength 3+/5, elbow flexion 4/5, decreased grip    Lower Extremity Assessment Lower Extremity Assessment: RLE deficits/detail RLE Deficits / Details: AROM WFL, strength hip flexion 3/5, knee extension 4-/5       Communication   Communication: Expressive difficulties(communicates well, but some word inconsistencies at times)  Cognition Arousal/Alertness: Awake/alert Behavior During Therapy: [facility]  for tasks assessed/performed Overall Cognitive Status: Impaired/Different from baseline Area of Impairment: Safety/judgement;Memory                     Memory: Decreased short-term memory   Safety/Judgement: Decreased awareness of deficits;Decreased awareness of safety            General Comments General comments (skin integrity, edema, etc.): wife in  the room, assisted pt to order dinner    Exercises     Assessment/Plan    PT Assessment Patient needs continued PT services  PT Problem List Decreased strength;Decreased mobility;Decreased balance;Decreased knowledge of use of DME;Decreased safety awareness;Decreased activity tolerance       PT Treatment Interventions Gait training;Functional mobility training;Therapeutic exercise;Patient/family education;Balance training;Therapeutic activities;DME instruction;Stair training    PT Goals (Current goals can be found in the Care Plan section)  Acute Rehab PT Goals Patient Stated Goal: to get stronger PT Goal Formulation: With patient/family Time For Goal Achievement: 09/16/19 Potential to Achieve Goals: Fair    Frequency     Barriers to discharge        Co-evaluation               AM-PAC PT "6 Clicks" Mobility  Outcome Measure Help needed turning from your back to your side while in a flat bed without using bedrails?: A Little Help needed moving from lying on your back to sitting on the side of a flat bed without using bedrails?: A Little Help needed moving to and from a bed to a chair (including a wheelchair)?: A Little Help needed standing up from a chair using your arms (e.g., wheelchair or bedside chair)?: A Lot Help needed to walk in hospital room?: A Little Help needed climbing 3-5 steps with a railing? : A Lot 6 Click Score: 16    End of Session   Activity Tolerance: Patient tolerated treatment well Patient left: in chair;with call bell/phone within reach;with family/visitor present Nurse Communication: Mobility status PT Visit Diagnosis: Muscle weakness (generalized) (M62.81);Other symptoms and signs involving the nervous system (R29.898);Hemiplegia and hemiparesis Hemiplegia - Right/Left: Right Hemiplegia - dominant/non-dominant: Dominant Hemiplegia - caused by: Unspecified    Time: 8648-4720 PT Time Calculation (min) (ACUTE ONLY): 24 min   Charges:    PT Evaluation $PT Eval Moderate Complexity: 1 Mod PT Treatments $Gait Training: 8-22 mins        Magda Kiel, [place] [facility] [phone] 09/02/2019   Reginia Naas 09/02/2019, 5:03 PM

## 2019-09-03 ENCOUNTER — Inpatient Hospital Stay (HOSPITAL_COMMUNITY): Payer: Medicare Other

## 2019-09-03 ENCOUNTER — Other Ambulatory Visit: Payer: Self-pay | Admitting: Radiation Therapy

## 2019-09-03 ENCOUNTER — Other Ambulatory Visit: Payer: Self-pay | Admitting: Radiation Oncology

## 2019-09-03 DIAGNOSIS — I7102 Dissection of abdominal aorta: Secondary | ICD-10-CM

## 2019-09-03 DIAGNOSIS — R011 Cardiac murmur, unspecified: Secondary | ICD-10-CM

## 2019-09-03 DIAGNOSIS — E119 Type 2 diabetes mellitus without complications: Secondary | ICD-10-CM

## 2019-09-03 DIAGNOSIS — R918 Other nonspecific abnormal finding of lung field: Secondary | ICD-10-CM

## 2019-09-03 DIAGNOSIS — R29810 Facial weakness: Secondary | ICD-10-CM

## 2019-09-03 DIAGNOSIS — Z720 Tobacco use: Secondary | ICD-10-CM

## 2019-09-03 DIAGNOSIS — Z79899 Other long term (current) drug therapy: Secondary | ICD-10-CM

## 2019-09-03 DIAGNOSIS — C7931 Secondary malignant neoplasm of brain: Secondary | ICD-10-CM

## 2019-09-03 DIAGNOSIS — R471 Dysarthria and anarthria: Secondary | ICD-10-CM

## 2019-09-03 DIAGNOSIS — I5020 Unspecified systolic (congestive) heart failure: Secondary | ICD-10-CM

## 2019-09-03 DIAGNOSIS — G939 Disorder of brain, unspecified: Secondary | ICD-10-CM

## 2019-09-03 DIAGNOSIS — Z7984 Long term (current) use of oral hypoglycemic drugs: Secondary | ICD-10-CM

## 2019-09-03 DIAGNOSIS — Z7289 Other problems related to lifestyle: Secondary | ICD-10-CM

## 2019-09-03 DIAGNOSIS — R4701 Aphasia: Secondary | ICD-10-CM

## 2019-09-03 DIAGNOSIS — C3490 Malignant neoplasm of unspecified part of unspecified bronchus or lung: Secondary | ICD-10-CM

## 2019-09-03 DIAGNOSIS — I110 Hypertensive heart disease with heart failure: Secondary | ICD-10-CM

## 2019-09-03 DIAGNOSIS — R630 Anorexia: Secondary | ICD-10-CM

## 2019-09-03 DIAGNOSIS — Z7982 Long term (current) use of aspirin: Secondary | ICD-10-CM

## 2019-09-03 LAB — BASIC METABOLIC PANEL
Anion gap: 13 (ref 5–15)
BUN: 13 mg/dL (ref 8–23)
CO2: 26 mmol/L (ref 22–32)
Calcium: 9.1 mg/dL (ref 8.9–10.3)
Chloride: 99 mmol/L (ref 98–111)
Creatinine, Ser: 1.02 mg/dL (ref 0.61–1.24)
GFR calc Af Amer: >60 mL/min (ref >60)
GFR calc non Af Amer: >60 mL/min (ref >60)
Glucose, Bld: 125 mg/dL — ABNORMAL HIGH (ref 70–99)
Potassium: 4.2 mmol/L (ref 3.5–5.1)
Sodium: 138 mmol/L (ref 135–145)

## 2019-09-03 LAB — CBC
HCT: 37.1 % — ABNORMAL LOW (ref 39.0–52.0)
Hemoglobin: 12.8 g/dL — ABNORMAL LOW (ref 13.0–17.0)
MCH: 31.5 pg (ref 26.0–34.0)
MCHC: 34.5 g/dL (ref 30.0–36.0)
MCV: 91.4 fL (ref 80.0–100.0)
Platelets: 327 10*3/uL (ref 150–400)
RBC: 4.06 MIL/uL — ABNORMAL LOW (ref 4.22–5.81)
RDW: 12.1 % (ref 11.5–15.5)
WBC: 14 10*3/uL — ABNORMAL HIGH (ref 4.0–10.5)
nRBC: 0 % (ref 0.0–0.2)

## 2019-09-03 LAB — GLUCOSE, CAPILLARY
Glucose-Capillary: 176 mg/dL — ABNORMAL HIGH (ref 70–99)
Glucose-Capillary: 207 mg/dL — ABNORMAL HIGH (ref 70–99)
Glucose-Capillary: 290 mg/dL — ABNORMAL HIGH (ref 70–99)
Glucose-Capillary: 254 mg/dL — ABNORMAL HIGH (ref 70–99)

## 2019-09-03 MED ORDER — LIDOCAINE HCL (PF) 1 % IJ SOLN
INTRAMUSCULAR | Status: AC
  Filled 2019-09-03: qty 30

## 2019-09-03 NOTE — Progress Notes (Signed)
Fortunately the patient's brain MRI with 3T protocol only showed one additional lesion. I called and let him know that there are a total of 6 lesions. The next step is to proceed with IR guided biopsy of his left supraclavicular node, if this is nondiagnostic then bronchoscopy will be considered per pulmonary.  Hopefully a biopsy can be performed in the next day or so.  We will follow-up with these results.  If he has non-small cell cancer then we would proceed with SRS however if he has a small cell lung cancer we would offer whole brain radiation.  I will call him back this afternoon when his wife is present so we can discuss further.  He is in agreement with this plan and fortunately he feels as though his speech has been modestly improving.  I also discussed with the internal medicine team our plans, if he does have a non-small cell lung cancer we could proceed with all of the planning as an outpatient when he is medically stable to discharge.    Carola Rhine, PAC

## 2019-09-03 NOTE — Progress Notes (Signed)
I called the patient back this afternoon so his wife could be present for our call as well. After reintroducing myself the patient stated he didn't want to talk to me until we have results from his biopsy. I let him know that it would likely take 2-3 days to result. He prefers to follow up in the outpatient setting. I offered to just summarize what is going on and what options we have to choose from once we have definitive biopsy but he declined and his wife hung up on our call. I will place orders for outpt med onc and ask our staff to coordinate a follow up next week in our office.     Carola Rhine, PAC

## 2019-09-03 NOTE — Evaluation (Signed)
Occupational Therapy Evaluation Patient Details Name: Geoffrey Benjamin MRN: [id number] DOB: 19-Aug-1941 Today's Date: 09/03/2019    History of Present Illness 78 year old smoker admitted with sudden onset right-sided weakness slurred speech and right facial droop.  Head CT showed edema in the left posterior frontal and parietal lobes.  CT angiogram showed 3mm left parietal mass with vasogenic edema.  MRI showed 5 brain metastases, largest was a 2.5 cm enhancing lesion in the left parietal lobe with extensive edema.  And imaging of his chest and abdomen which showed bulky mediastinal lymphadenopathy and multiple pulmonary nodules.   Clinical Impression   This 78 yo male admitted with above presents to acute OT with decreased balance, decreased use of RUE, decreased safety awareness all affecting his safety and PLOF of being independent with basic ADLs. He will benefit from acute OT with follow up [place] as long as he has 24 hour S/ and A any time he is up on his feet.     Follow Up Recommendations  Home health OT;Supervision/Assistance - 24 hour;Other (comment)(HHAide)    Equipment Recommendations  Tub/shower seat;Other (comment)(really needs this for safety--tried to call wife (both numbers) no answer)       Precautions / Restrictions Precautions Precautions: Fall Restrictions Weight Bearing Restrictions: No      Mobility Bed Mobility               General bed mobility comments: Pt sitting EOB upon my arrival  Transfers Overall transfer level: Needs assistance Equipment used: None Transfers: Sit to/from Stand Sit to Stand: Min guard              Balance Overall balance assessment: Needs assistance Sitting-balance support: No upper extremity supported;Feet supported Sitting balance-Leahy Scale: Good Sitting balance - Comments: Crossing legs to get to feet while seated--no LOB   Standing balance support: No upper extremity supported Standing balance-Leahy  Scale: Fair                             ADL either performed or assessed with clinical judgement   ADL Overall ADL's : Needs assistance/impaired Eating/Feeding: Set up;Sitting   Grooming: Minimal assistance;Standing   Upper Body Bathing: Minimal assistance;Sitting   Lower Body Bathing: Sit to/from stand;Minimal assistance   Upper Body Dressing : Moderate assistance;Sitting   Lower Body Dressing: Sit to/from stand;Minimal assistance Lower Body Dressing Details (indicate cue type and reason): He was able to don socks one handed with increased time Toilet Transfer: Min guard;Ambulation   Toileting- Clothing Manipulation and Hygiene: Minimal assistance;Sit to/from stand               Vision Baseline Vision/History: Wears glasses Wears Glasses: Reading only Patient Visual Report: No change from baseline              Pertinent Vitals/Pain Pain Assessment: No/denies pain     Hand Dominance Left   Extremity/Trunk Assessment Upper Extremity Assessment Upper Extremity Assessment: RUE deficits/detail RUE Deficits / Details: RUE lags behind when he raises both arms up (shoulder flexion) at same time, rest 3/5 but patient is not always attentive/tries to use RUE (even with bi-manual task--ie: donning socks, when RUE not doing what he wanted he just used LUE) RUE Coordination: decreased fine motor;decreased gross motor           Communication Communication Communication: Expressive difficulties   Cognition Arousal/Alertness: Awake/alert Behavior During Therapy: WFL for tasks assessed/performed Overall Cognitive Status: Impaired/Different  from baseline Area of Impairment: Safety/judgement                         Safety/Judgement: Decreased awareness of safety;Decreased awareness of deficits     General Comments: frustrated by expressive speech issues              Home Living Family/patient expects to be discharged to:: Private  residence Living Arrangements: Spouse/significant other Available Help at Discharge: Family;Available 24 hours/day Type of Home: House Home Access: Stairs to enter [employer] of Steps: 4 Entrance Stairs-Rails: Right Home Layout: One level     Bathroom Shower/Tub: Corporate investment banker: Standard     Home Equipment: None          Prior Functioning/Environment Level of Independence: Independent                 OT Problem List: Decreased strength;Decreased range of motion;Impaired balance (sitting and/or standing);Impaired UE functional use;Decreased coordination      OT Treatment/Interventions: Self-care/ADL training;Therapeutic exercise;Therapeutic activities;DME and/or AE instruction;Patient/family education;Balance training    OT Goals(Current goals can be found in the care plan section) Acute Rehab OT Goals Patient Stated Goal: to go home OT Goal Formulation: With patient Time For Goal Achievement: 09/17/19 Potential to Achieve Goals: Good  OT Frequency: Min 2X/week              AM-PAC OT "6 Clicks" Daily Activity     Outcome Measure Help from another person eating meals?: A Little Help from another person taking care of personal grooming?: A Little Help from another person toileting, which includes using toliet, bedpan, or urinal?: A Little Help from another person bathing (including washing, rinsing, drying)?: A Little Help from another person to put on and taking off regular upper body clothing?: A Little Help from another person to put on and taking off regular lower body clothing?: A Little 6 Click Score: 18   End of Session Equipment Utilized During Treatment: Gait belt  Activity Tolerance: Patient tolerated treatment well Patient left: in chair;with call bell/phone within reach;with chair alarm set  OT Visit Diagnosis: Unsteadiness on feet (R26.81);Other abnormalities of gait and mobility (R26.89);Muscle weakness  (generalized) (M62.81);Other symptoms and signs involving cognitive function                Time: 5329-9242 OT Time Calculation (min): 19 min Charges:  OT General Charges $OT Visit: 1 Visit OT Evaluation $OT Eval Moderate Complexity: 1 Mod  [place], OTR/L Acute [employer] Pager [phone] Office [phone]     Almon Register 09/03/2019, 10:02 AM

## 2019-09-03 NOTE — TOC Initial Note (Signed)
Transition of Care [facility]) - Initial/Assessment Note    Patient Details  Name: Geoffrey Benjamin MRN: [id number] Date of Birth: 27-May-1941  [facility] CM/SW Contact:    Sharin Mons, RN Phone Number: 09/03/2019, 3:55 PM  Clinical Narrative:                 Admitted with c/o right facial droop/slurred speech and worsening right side weakness. Hospital course,mediastinal lymphadenopathy   . Code stroke r/o.Hx of Tobacco use, HTN, HLD, DM, CHF. From home with wife. States independent with ADL's PTA, no DME usage.   PT/OT evaluations shared with pt and wife @ bedside per NCM. Pt agreeable to home health services. Choice offered. Pt without preference. [facility] selected and accepted pending MD 's orders. Face to face and [place] orders will be needed for pt to receive [facility] services.  Tiger Spieker     [phone]       Expected Discharge Plan: (P) [place] Barriers to Discharge: Continued Medical Work up   Patient Goals and CMS Choice Patient states their goals for this hospitalization and ongoing recovery are:: to go home and  get better CMS Medicare.gov Compare Post Acute Care list provided to:: (P) Patient Choice offered to / list presented to : Patient  Expected Discharge Plan and Services Expected Discharge Plan: (P) [name]   Discharge Planning Services: CM Consult   Living arrangements for the past 2 months: Single Family Home                           HH Arranged: PT, OT [place] Agency: [place] ([facility]) Date HH Agency Contacted: 09/03/19 Time [place]: [place] Representative spoke with at [place]: Butch Penny  Prior Living Arrangements/Services Living arrangements for the past 2 months: [place] Lives with:: Spouse   Do you feel safe going back to the place where you live?: Yes               Activities of Daily Living Home Assistive Devices/Equipment: None    Permission  Sought/Granted Permission sought to share information with : Case Manager, Family Supports Permission granted to share information with : Yes, Release of Information Signed              Emotional Assessment              Admission diagnosis:  Weakness [R53.1] Brain mass [G93.89] Patient Active Problem List   Diagnosis Date Noted  . Brain metastases ([place]) 09/02/2019  . Right sided weakness 09/01/2019  . CAP (community acquired pneumonia) 09/13/2018  . Hyperlipidemia 09/13/2018  . Volume overload 09/13/2018  . Tobacco use 09/13/2018  . Aortic stenosis 09/13/2018  . GERD 06/04/2009  . PERSONAL HX COLONIC POLYPS 06/04/2009  . LOSS OF WEIGHT 09/29/2008  . RLQ PAIN 09/29/2008  . Type 2 diabetes mellitus ([place]) 09/22/2008   PCP:  Heywood Bene PA-C Pharmacy:   [facility] ([place]), [place] - [address] [place] ([place]) [place] [address] Phone: [phone] Fax: [phone]     Social Determinants of Health (SDOH) Interventions    Readmission Risk Interventions No flowsheet data found.

## 2019-09-03 NOTE — Progress Notes (Signed)
Physical Therapy Treatment Patient Details Name: Geoffrey Benjamin MRN: [id number] DOB: 09/23/1941 Today's Date: 09/03/2019    History of Present Illness 78 year old smoker admitted with sudden onset right-sided weakness slurred speech and right facial droop.  Head CT showed edema in the left posterior frontal and parietal lobes.  CT angiogram showed 46mm left parietal mass with vasogenic edema.  MRI showed 5 brain metastases, largest was a 2.5 cm enhancing lesion in the left parietal lobe with extensive edema.  And imaging of his chest and abdomen which showed bulky mediastinal lymphadenopathy and multiple pulmonary nodules.    PT Comments    Pt performed ambulation throughout the room this session from bed<>bathroom without use of AD and with min assist for safety and balance, pt with decreased awareness with mobility today. Pt frustrated this session and reports "today is a bad day." Therapist provided education to patient and his wife regarding safety with mobility, awareness of deficits, equipment recommendations and techniques for stair negotiation at home. Will continue to follow acutely in order to address impairments in balance, strength and awareness/safety. Recommend d/c home with home health follow up services to maximize independence with functional mobility.    Follow Up Recommendations  Home health PT;Supervision/Assistance - 24 hour(HH aide)     Equipment Recommendations  Rolling walker with 5" wheels;3in1 (PT)    Recommendations for Other Services       Precautions / Restrictions Precautions Precautions: Fall Restrictions Weight Bearing Restrictions: No    Mobility  Bed Mobility Overal bed mobility: Needs Assistance Bed Mobility: Sit to Supine     Supine to sit: Min assist;HOB elevated     General bed mobility comments: Pt sitting EOB upon therapist arrival  Transfers Overall transfer level: Needs assistance Equipment used: None Transfers: Sit to/from  Stand Sit to Stand: Min guard         General transfer comment: min guard for sit>stand from toilet  Ambulation/Gait Ambulation/Gait assistance: Min assist Gait Distance (Feet): 10 Feet(10 ft) Assistive device: None Gait Pattern/deviations: Step-to pattern;Trunk flexed;Decreased stride length;Decreased stance time - right Gait velocity: decreased   General Gait Details: pt ambulated to/from bathroom this session without AD and requiring min assist for safety/balance, pt by therapist trying to assist, poor awareness of deficits, pt runs into sink/counter in the room with R UE   Stairs             Wheelchair Mobility    Modified Rankin (Stroke Patients Only)       Balance Overall balance assessment: Needs assistance Sitting-balance support: No upper extremity supported;Feet supported Sitting balance-Leahy Scale: Good Sitting balance - Comments: Crossing legs to get to feet while seated--no LOB   Standing balance support: No upper extremity supported Standing balance-Leahy Scale: Fair Standing balance comment: supervision for static standing balance without UE support, min guard/min assist for dynamic balance during ambulation                            Cognition Arousal/Alertness: Awake/alert Behavior During Therapy: WFL for tasks assessed/performed Overall Cognitive Status: Impaired/Different from baseline Area of Impairment: Safety/judgement                     Memory: Decreased short-term memory   Safety/Judgement: Decreased awareness of safety;Decreased awareness of deficits     General Comments: Pt reports having a "bad day" and is frsutrated that "people keep bothering me"      Exercises  General Comments General comments (skin integrity, edema, etc.): wife present during session, discussed d/c planning, potential need for assistive device for safety. Pt declines practicing stairs this session, therapist educated pt and his  wife on techniques for safe stair navigation including use of rails and step to pattern leading with stronger leg when ascending. Pt continues to be frustrated with therapist "I have already been doing that, I don't need your help."      Pertinent Vitals/Pain Pain Assessment: No/denies pain    Home Living                      Prior Function            PT Goals (current goals can now be found in the care plan section) Progress towards PT goals: Progressing toward goals    Frequency    Min 3X/week      PT Plan Current plan remains appropriate    Co-evaluation              AM-PAC PT "6 Clicks" Mobility   Outcome Measure  Help needed turning from your back to your side while in a flat bed without using bedrails?: A Little Help needed moving from lying on your back to sitting on the side of a flat bed without using bedrails?: A Little Help needed moving to and from a bed to a chair (including a wheelchair)?: A Little Help needed standing up from a chair using your arms (e.g., wheelchair or bedside chair)?: A Little Help needed to walk in hospital room?: A Little Help needed climbing 3-5 steps with a railing? : A Lot 6 Click Score: 17    End of Session   Activity Tolerance: Patient tolerated treatment well Patient left: in bed;with family/visitor present;with bed alarm set Nurse Communication: Mobility status PT Visit Diagnosis: Muscle weakness (generalized) (M62.81);Other symptoms and signs involving the nervous system (R29.898);Hemiplegia and hemiparesis Hemiplegia - Right/Left: Right Hemiplegia - dominant/non-dominant: Dominant Hemiplegia - caused by: Unspecified     Time: 1425-1435 PT Time Calculation (min) (ACUTE ONLY): 10 min  Charges:  $Therapeutic Activity: 8-22 mins                     Netta Corrigan, PT, DPT, CSRS Acute Rehab Office [place] 09/03/2019, 4:09 PM

## 2019-09-03 NOTE — Procedures (Signed)
Interventional Radiology Procedure:   Indications: Metastatic disease  Procedure: US guided core biopsy of right supraclavicular lymph node  Findings: Multiple small abnormal right supraclavicular lymph nodes.  18 gauge core biopsies obtained.   Complications: None   EBL: less than 10 ml  Plan: Bedrest 1 hour.    Tamantha Saline R. Anselm Pancoast, MD  Pager: [phone]

## 2019-09-03 NOTE — Progress Notes (Signed)
Patient ID: Geoffrey Benjamin, male   DOB: Sep 13, 1941, 78 y.o.   MRN: [id number] Reviewed MRI CT scan with myself as well as one of her partners.  5 minutes identified largest 2-2 and half centimeter left posterior frontoparietal met is either in or immediately adjacent to the motor strip making surgery very high risk especially as this extends deep into the subcortical white matter.  I do agree best course of action for this once primary pathology pathology is identified, would potentially be stereotactic radiosurgery and I am available for this as an outpatient.

## 2019-09-03 NOTE — Progress Notes (Signed)
  Speech Language Pathology Treatment: Dysphagia  Patient Details Name: Geoffrey Benjamin MRN: [id number] DOB: 17-Sep-1941 Today's Date: 09/03/2019 Time: 4239-5320 SLP Time Calculation (min) (ACUTE ONLY): 10 min  Assessment / Plan / Recommendation Clinical Impression  F/u after yesterday's clinical swallow assessment.  Pt observed to eat regular solids and drink thin liquids with adequate mastication, brisk swallow response, and no s/s of aspiration.  No cues required. Mild right lower face asymmetry; sensation intact.  No concerns for dysphagia or aspiration.  Our service will sign off.   HPI HPI: 78 yo male with history of hypertension type 2 diabetes, heart failure with reduced ejection fraction, tobacco use and alcohol use who presented for worsening right-sided weakness.  HPI is notable for 3 weeks of right-sided weakness for which he was referred to and seen by neurology.  An MRI had been ordered but not yet performed when he had sudden worsening of right-sided weakness associated with some gait difficulty and dysarthria.  He was seen in the emergency department as a code stroke by neurology.  Work-up in the ED included an MRI has revealed multiple brain masses with some surrounding vasogenic edema      SLP Plan  Discharge SLP treatment due to (comment)       Recommendations  Diet recommendations: Regular;Thin liquid Liquids provided via: Cup;Straw Medication Administration: Whole meds with liquid Supervision: Patient able to self feed                Oral Care Recommendations: Oral care BID Follow up Recommendations: None SLP Visit Diagnosis: Dysphagia, unspecified (R13.10) Plan: Discharge SLP treatment due to (comment)       GO                Geoffrey Benjamin 09/03/2019, 10:30 AM  Estill Bamberg L. Tivis Ringer, [place] Office number [phone] Pager [phone]

## 2019-09-03 NOTE — Progress Notes (Signed)
Subjective: Patient sitting comfortably in bed. Discussed CT abdomen and CT chest findings today. Discussed with patient the plan for IR to proceed with US guided FNA of L supraclavicular lymph node. No acute changes today. Decreased appetite but otherwise doing ok today. Endorses SOB which is no worse than his usual. Denies CP, abdominal pain, lower leg swelling.   Relevant presenting history: He began having right hand weakness and cramping on 11/06. He was seen by his PCP the same day and was subsequently referred to outpatient neurology. He was seen by Neurology on 11/24 and was told he may be having a mini-stroke, and he was referred to get a brain MRI which showed evidence of brain mets, with most notable 2.5 cm left parietal lesion with vasogenic edema.   Patient presented to ED on 11/29 with weakness progressed to R. arm and R.leg with associated gait difficulty. At that time, his wife noticed he was having difficulty with speech, right sided facial droop , and seemed to have worsening weakness prompting them to present to the ED. Pt was subsequently referred here, and further imaging (CT head and abdomen) was obtained to investigate for source of brain mets.  Objective:  Vital signs in last 24 hours: Vitals:   09/02/19 2345 09/03/19 0338 09/03/19 0743 09/03/19 0927  BP: (!) 151/90 (!) 177/75 127/71 117/69  Pulse: 64 66 63 90  Resp: 20 (!) 21 18   Temp: 97.8 F (36.6 C) (!) 97.4 F (36.3 C) (!) 97.4 F (36.3 C)   TempSrc: Oral  Oral   SpO2: 98% 99% 97%   Weight:      Height:       Weight change:   Intake/Output Summary (Last 24 hours) at 09/03/2019 1057 Last data filed at 09/03/2019 0910 Gross per 24 hour  Intake 120 ml  Output 800 ml  Net -680 ml   General: NAD, seating comfortably in bed CV: regular rate and rhythm. III/IV harsh systolic murmur most notable over LUSB. Pulses symmetric bilaterally.  Pulm: CTA, breathing is non-labored Neuro: dysarthria, expressive  aphasia. Symmetric shoulder shrug. Right-sided facial droop. Str 4/5 RUE and 4/5 RLE; 5/5 str in LUE and LLE.    Assessment/Plan: Geoffrey Benjamin is a 78 y/o gentleman with history of tobacco use (90 pack years), alcohol use, HTN, T2DM, and HFrEF (EF 30-35% 09/2018), who presents with right-sided weakness, facial droop, and dysarthria 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  #Metastatic brain masses - Discussed CT findings with patient this morning. Imaging showing bulky mediastinal adenopathy in supraclavicular adenopathy with multiple pulmonary nodules compatible with metastatic disease. Discussed that this is potentially related to primary lung cancer. - Most recent MRI brain performed on 11/30 showing one additional 2-3 mm cerebellar metastasis, bringing the total enhancing brain metastases to six. - Plan for US-guided FNA of left supraclavicular lymph node by IR today.  - Continue Keppra 500 mg twice daily - Continue Decadron 6 mg twice daily - Per Neurosurgery, no surgery at this time. - Rad Onc consulted; per review of their notes, plan for [facility] versus whole brain radiation pending biopsy results. They would plan for all outpatient follow-up with Rad Onc if found to have non-small cell lung cancer.  #HTN -Home meds Lisinopril and metoprolol -Continue metoprolol  #HFrEF -Continue home aspirin and statin -Holding Lasix due to soft BPs  #T2DM -Hold home meds glipizide and metformin - SSI   Dispo: Anticipated discharge in 1-2 days.   Active Problems:  Right sided weakness    LOS: 2 days   Geoffrey Benjamin, Medical Student 09/03/2019, 10:57 AM

## 2019-09-04 ENCOUNTER — Other Ambulatory Visit: Payer: Self-pay

## 2019-09-04 ENCOUNTER — Encounter: Payer: Self-pay | Admitting: *Deleted

## 2019-09-04 ENCOUNTER — Telehealth: Payer: Self-pay | Admitting: Medical Oncology

## 2019-09-04 DIAGNOSIS — Z7901 Long term (current) use of anticoagulants: Secondary | ICD-10-CM

## 2019-09-04 DIAGNOSIS — C719 Malignant neoplasm of brain, unspecified: Secondary | ICD-10-CM

## 2019-09-04 LAB — BASIC METABOLIC PANEL
Anion gap: 10 (ref 5–15)
BUN: 15 mg/dL (ref 8–23)
CO2: 26 mmol/L (ref 22–32)
Calcium: 9 mg/dL (ref 8.9–10.3)
Chloride: 101 mmol/L (ref 98–111)
Creatinine, Ser: 1.02 mg/dL (ref 0.61–1.24)
GFR calc Af Amer: >60 mL/min (ref >60)
GFR calc non Af Amer: >60 mL/min (ref >60)
Glucose, Bld: 145 mg/dL — ABNORMAL HIGH (ref 70–99)
Potassium: 4.1 mmol/L (ref 3.5–5.1)
Sodium: 137 mmol/L (ref 135–145)

## 2019-09-04 LAB — CBC
HCT: 33.9 % — ABNORMAL LOW (ref 39.0–52.0)
Hemoglobin: 11.8 g/dL — ABNORMAL LOW (ref 13.0–17.0)
MCH: 31.5 pg (ref 26.0–34.0)
MCHC: 34.8 g/dL (ref 30.0–36.0)
MCV: 90.4 fL (ref 80.0–100.0)
Platelets: 315 10*3/uL (ref 150–400)
RBC: 3.75 MIL/uL — ABNORMAL LOW (ref 4.22–5.81)
RDW: 12.1 % (ref 11.5–15.5)
WBC: 13.5 10*3/uL — ABNORMAL HIGH (ref 4.0–10.5)
nRBC: 0 % (ref 0.0–0.2)

## 2019-09-04 LAB — GLUCOSE, CAPILLARY
Glucose-Capillary: 197 mg/dL — ABNORMAL HIGH (ref 70–99)
Glucose-Capillary: 292 mg/dL — ABNORMAL HIGH (ref 70–99)

## 2019-09-04 MED ORDER — APIXABAN 2.5 MG PO TABS: 2.5000 mg | ORAL_TABLET | Freq: Two times a day (BID) | ORAL | 2 refills | Status: DC

## 2019-09-04 MED ORDER — DEXAMETHASONE 6 MG PO TABS: 6.0000 mg | ORAL_TABLET | Freq: Two times a day (BID) | ORAL | 2 refills | Status: DC

## 2019-09-04 MED ORDER — INSULIN ASPART 100 UNIT/ML CARTRIDGE (PENFILL): 3.0000 [IU] | Freq: Every day | SUBCUTANEOUS | 5 refills | Status: DC

## 2019-09-04 MED ORDER — LEVETIRACETAM 500 MG PO TABS: 500.0000 mg | ORAL_TABLET | Freq: Two times a day (BID) | ORAL | 3 refills | Status: DC

## 2019-09-04 NOTE — Discharge Summary (Addendum)
Name: Geoffrey Benjamin MRN: [id number] DOB: 25-Jan-1941 78 y.o. PCP: Merwyn Katos  Date of Admission: 09/01/2019  1:35 PM Date of Discharge: 12/01 Attending Physician: Bartholomew Crews, MD  Discharge Diagnosis: 1. Metastatic Brain Mass   Discharge Medications: Allergies as of 09/04/2019   No Known Allergies     Medication List    STOP taking these medications   furosemide 40 MG tablet Commonly known as: Lasix   lisinopril 2.5 MG tablet Commonly known as: ZESTRIL   potassium chloride SA 20 MEQ tablet Commonly known as: KLOR-CON     TAKE these medications   apixaban 2.5 MG Tabs tablet Commonly known as: Eliquis Take 1 tablet (2.5 mg total) by mouth 2 (two) times daily.   aspirin EC 81 MG tablet Take 81 mg by mouth daily.   atorvastatin 80 MG tablet Commonly known as: LIPITOR Take 80 mg by mouth daily.   dexamethasone 6 MG tablet Commonly known as: DECADRON Take 1 tablet (6 mg total) by mouth 2 (two) times daily.   Fish Oil 1200 MG Caps Take 1,200 mg by mouth daily.   glipiZIDE 5 MG tablet Commonly known as: GLUCOTROL Take 5 mg by mouth daily before breakfast.   insulin aspart cartridge Commonly known as: NOVOLOG Inject 3 Units into the skin at bedtime.   levETIRAcetam 500 MG tablet Commonly known as: KEPPRA Take 1 tablet (500 mg total) by mouth 2 (two) times daily.   metFORMIN 1000 MG tablet Commonly known as: GLUCOPHAGE Take 1 tablet (1,000 mg total) by mouth 2 (two) times daily with a meal.   metoprolol tartrate 25 MG tablet Commonly known as: LOPRESSOR Take 1 tablet (25 mg total) by mouth 2 (two) times daily.   mirtazapine 15 MG tablet Commonly known as: REMERON Take 15 mg by mouth at bedtime.   pantoprazole 40 MG tablet Commonly known as: PROTONIX Take 1 tablet (40 mg total) by mouth daily. What changed: when to take this            Durable Medical Equipment  (From admission, onward)         Start     Ordered   09/04/19 0000  For home use only DME 4 wheeled rolling walker with seat    Question:  Patient needs a walker to treat with the following condition  Answer:  Metastatic cancer ([place])   09/04/19 1150          Disposition and follow-up:   Geoffrey Benjamin was discharged from [facility] in Stable condition.  At the hospital follow up visit please address:  1.  Follow-up: - Follow up with Oncology for further evaluation and management of metastatic cancer  - PCP follow up for HTN and DM - patient recent had multiple HTN medications changed and will need re-evaluation.  2.  Labs / imaging needed at time of follow-up: CBC, BMP  3.  Pending labs/ test needing follow-up: Cytology for tissue sample  Follow-up Appointments: Follow-up Information    [facility] Follow up.   Why: home health services arranged       Heywood Bene, PA-C. Schedule an appointment as soon as possible for a visit in 1 week(s).   Specialty: Physician Assistant Contact information: [address] [place] [address] [place] [address] [phone]        Josue Hector, MD .   Specialty: Cardiology Contact information: [address] [place] [address] [phone]  [place] Follow up.   Why: [facility] will call you to make anappointment.           Hospital Course by problem list:  GILES CURRIE is a 78 year old gentleman with history oftobacco use(90 pack years), alcohol use,HTN,T2DM,andHFrEF(EF 30-35% 09/2018),who initially presented to [facility] with right-sided weakness, facial droop, and dysarthriaon 09/01/19, most consistent with metastatic brain masses from a likely primary lung source.   1. Metastatic brain masses Pt initially noticed right hand weakness about one month ago. This progressed to right leg weakness over the next few days. History also remarkable for a 20 pound weight  loss in the past year. He was seen by his PCP on 11/6 and was referred to outpatient Neurology for and MRI of his Brain. Patient symptoms progressed prior to this visit, which prompted his visit to [facility] ED on 11/29.   On presentation, the patient complained of worsening symptoms including gait difficulty and dysarthria. MRI of Head/neck showed 5 brain metastases, most notably a 2.5 cm enhancing lesion in the left parietal lobe with extensive vasogenic edema. He had  adenopathy in the mediastinum/bilateral supraclavicular fossae and right upper lobe nodules. EEG showed evidence of epileptogenic. Patient started on Keppra for seizure prevention and Decadron for vasogenic edema. Given patients history of 60 + pack year of smoking, primary lung cancer suspected. Subsequent CT chest and abdomen was significant for mediastinal adenopathy with multiple pulmonary nodules consistent with metastatic disease. US-guided FNA was performed on 12/01 of the left supraclavicular mass. Rad/Onc documented their plan for SRS versus whole brain radiation pending these biopsy results.On 12/2, patient was discharged with scheduled follow-up with thoracic oncology on 12/3. He was discharged home on Keppra, Decadrom , and Apixaban.  2. HTN Patient's BP regimen consists of Lisinopril and metroprolol. Metoprolol was continued throughout his hospital course, and lisinopril was held due to soft blood pressures. Patient's lisinopril was held pending follow up with his PCP/Onc for re-evaluation and management of his blood pressure  3. HFrEF Patient's BP regimen consists of aspirin, statin, and Lasix. Lasix was held throughout his hospital course due to soft blood pressures. He was discharged on ASA and Statin. Lasix was held pend outpatient follow up. his home medications.  4. T2DM Home medications consisted of glipizide and metformin. These were held during his hospital stay, and pt placed on SSI. His CBG subsequently was mostly between  200-300 on only per-meal bolus NovoLog. He was discharged on his home medication regimen (glipizide 5 mg daily) and metformin (1000 mg BID) with outpatient follow-up to ensure that he does not develop symptomatic hyperglycemia.   5. DVT prophylaxis Lovenox has the best evidence to support its use in malignancy, but patient has never done injections. He was discharged on Eliquis, 2.5 mg BID for DVT prophylaxis.   Discharge Vitals:   BP (!) 159/73 (BP Location: Left Arm)   Pulse 65   Temp 97.8 F (36.6 C) (Oral)   Resp 20   Ht 6' (1.829 m)   Wt 72.1 kg   SpO2 95%   BMI 21.56 kg/m   Pertinent Labs, Studies, and Procedures:  MRI Brain W WO  Contrast, 09/01/19: IMPRESSION: Five brain metastases, most notably a 2.5 cm enhancing lesion in the left parietal lobe with extensive vasogenic edema.  CT Angio Head and Neck., 09/01/19: IMPRESSION: 1. 16 mm left parietal mass with extensive vasogenic edema. The mass is considered secondary to metastatic disease  given adenopathy is seen in the mediastinum/bilateral supraclavicular fossae and there are right upper lobe nodules. 2. Atherosclerosis without emergent vascular finding. 3. 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  CT Chest and Abdomen, W Contrast, 09/02/19: IMPRESSION: 1. Bulky mediastinal adenopathy in supraclavicular adenopathy with multiple pulmonary nodules compatible with metastatic disease. 2. Process potentially related to primary lung cancer. 3. 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 This may be a chronic finding but was not present in 2010. Consider correlation with any symptoms of pain in this area and follow-up vascular surgery consultation, more urgent if the patient is experiencing symptoms that would suggest instability of this process, i.e. acute back pain. 4. Question hyperenhancement of the left hemiprostate.  This could also be related to calcification within the gland, nonspecific overall finding. Correlation with PSA may be helpful given other findings on today's scan that are compatible with metastatic disease. 5. Emphysema and aortic atherosclerosis.  MRI Brain W WO Contrast, 11/30: IMPRESSION: 1. Study for stereotactic treatment planning. One additional 2-3 mm cerebellar metastasis is identified, bringing the total enhancing brain metastases to Six. All lesions annotated on series 6. 2. Stable dominant lesion in the posterior left hemisphere which is peripherally located with possible focal dural involvement. Stable vasogenic edema and mild intracranial mass effect.  Discharge Instructions: Discharge Instructions    Diet - low sodium heart healthy   Complete by: As directed    Face-to-face encounter (required for Medicare/Medicaid patients)   Complete by: As directed    I Marianna Payment certify that this patient is under my care and that I, or a nurse practitioner or physician's assistant working with me, had a face-to-face encounter that meets the physician face-to-face encounter requirements with this patient on 09/04/2019. The encounter with the patient was in whole, or in part for the following medical condition(s) which is the primary reason for home health care (List medical condition): Metastatic Cancer   The encounter with the patient was in whole, or in part, for the following medical condition, which is the primary reason for home health care: Metastatic cancer   I certify that, based on my findings, the following services are medically necessary home health services:  Nursing Physical therapy     Reason for Medically Necessary Home Health Services: Therapy- Personnel officer, Public librarian   My clinical findings support the need for the above services: Unable to leave home safely without assistance and/or assistive device   Further, I certify that my clinical  findings support that this patient is homebound due to: Unsafe ambulation due to balance issues   For home use only DME 4 wheeled rolling walker with seat   Complete by: As directed    Patient needs a walker to treat with the following condition: Metastatic cancer ([place])   Home Health   Complete by: As directed    To provide the following care/treatments:  PT [place] OT     Increase activity slowly   Complete by: As directed       Signed: Marianna Payment, MD 09/04/2019, 11:54 AM   Pager: [phone]

## 2019-09-04 NOTE — Discharge Instructions (Signed)
You were hospitalized for Metastatic Tumors. Thank you for allowing [place] to be part of your care.   Please follow up with:  1) Your PCP within 1 to 2 weeks 2) Oncology at [facility]   Please note these changes made to your medications: see after visit summary for changes in medications. Please hold Metformin on the day of your office visit for Radiation Oncology.    Please make sure to follow up with Oncology.  Please call our clinic if you have any questions or concerns, we may be able to help and keep you from a long and expensive emergency room wait. Our clinic and after hours phone number is [phone], the best time to call is Monday through Friday 9 am to 4 pm but there is always someone available 24/7 if you have an emergency. If you need medication refills please notify your pharmacy one week in advance and they will send [place] a request.

## 2019-09-04 NOTE — Progress Notes (Signed)
Oncology Nurse Navigator Documentation  Oncology Nurse Navigator Flowsheets 09/04/2019  Navigator Location [facility]  Referral Date to RadOnc/MedOnc 09/03/2019  Navigator Encounter Type Other/I received urgent referral on Geoffrey Benjamin yesterday.  I updated new patient coordinator due to the urgent referral to have on call doctor see patient.  I completed this yesterday.   Barriers/Navigation Needs Coordination of Care  Interventions Coordination of Care  Acuity Level 2-Minimal Needs (1-2 Barriers Identified)  Time Spent with Patient 15

## 2019-09-04 NOTE — Plan of Care (Signed)
  Problem: Education: Goal: Knowledge of General Education information will improve Description: Including pain rating scale, medication(s)/side effects and non-pharmacologic comfort measures 09/04/2019 1222 by Sherre Poot, RN Outcome: Adequate for Discharge 09/04/2019 1026 by Sherre Poot, RN Outcome: Not Progressing   Problem: Health Behavior/Discharge Planning: Goal: Ability to manage health-related needs will improve 09/04/2019 1222 by Sherre Poot, RN Outcome: Adequate for Discharge 09/04/2019 1026 by Sherre Poot, RN Outcome: Not Progressing   Problem: Clinical Measurements: Goal: Ability to maintain clinical measurements within normal limits will improve 09/04/2019 1222 by Sherre Poot, RN Outcome: Adequate for Discharge 09/04/2019 1026 by Sherre Poot, RN Outcome: Not Progressing Goal: Will remain free from infection 09/04/2019 1222 by Sherre Poot, RN Outcome: Adequate for Discharge 09/04/2019 1026 by Sherre Poot, RN Outcome: Not Progressing Goal: Diagnostic test results will improve 09/04/2019 1222 by Sherre Poot, RN Outcome: Adequate for Discharge 09/04/2019 1026 by Sherre Poot, RN Outcome: Not Progressing Goal: Respiratory complications will improve 09/04/2019 1222 by Sherre Poot, RN Outcome: Adequate for Discharge 09/04/2019 1026 by Sherre Poot, RN Outcome: Not Progressing Goal: Cardiovascular complication will be avoided 09/04/2019 1222 by Sherre Poot, RN Outcome: Adequate for Discharge 09/04/2019 1026 by Sherre Poot, RN Outcome: Not Progressing   Problem: Activity: Goal: Risk for activity intolerance will decrease 09/04/2019 1222 by Sherre Poot, RN Outcome: Adequate for Discharge 09/04/2019 1026 by Sherre Poot, RN Outcome: Not Progressing   Problem: Nutrition: Goal: Adequate nutrition will be maintained 09/04/2019 1222 by Sherre Poot, RN Outcome: Adequate for  Discharge 09/04/2019 1026 by Sherre Poot, RN Outcome: Not Progressing   Problem: Coping: Goal: Level of anxiety will decrease 09/04/2019 1222 by Sherre Poot, RN Outcome: Adequate for Discharge 09/04/2019 1026 by Sherre Poot, RN Outcome: Not Progressing   Problem: Elimination: Goal: Will not experience complications related to bowel motility 09/04/2019 1222 by Sherre Poot, RN Outcome: Adequate for Discharge 09/04/2019 1026 by Sherre Poot, RN Outcome: Not Progressing Goal: Will not experience complications related to urinary retention 09/04/2019 1222 by Sherre Poot, RN Outcome: Adequate for Discharge 09/04/2019 1026 by Sherre Poot, RN Outcome: Not Progressing   Problem: Pain Managment: Goal: General experience of comfort will improve 09/04/2019 1222 by Sherre Poot, RN Outcome: Adequate for Discharge 09/04/2019 1026 by Sherre Poot, RN Outcome: Not Progressing   Problem: Safety: Goal: Ability to remain free from injury will improve 09/04/2019 1222 by Sherre Poot, RN Outcome: Adequate for Discharge 09/04/2019 1026 by Sherre Poot, RN Outcome: Not Progressing   Problem: Skin Integrity: Goal: Risk for impaired skin integrity will decrease 09/04/2019 1222 by Sherre Poot, RN Outcome: Adequate for Discharge 09/04/2019 1026 by Sherre Poot, RN Outcome: Not Progressing

## 2019-09-04 NOTE — Progress Notes (Signed)
Patient and given all discharge instructions. Both IVs removed, tele removed. Notified MD to come see patient for more questions. Follow up appointments were made. No further concerns.

## 2019-09-04 NOTE — TOC Transition Note (Signed)
Transition of Care [facility]) - CM/SW Discharge Note   Patient Details  Name: Geoffrey Benjamin MRN: [id number] Date of Birth: Dec 02, 1940  Transition of Care [facility]) CM/SW Contact:  Sharin Mons, RN Phone Number: 09/04/2019, 2:36 PM   Clinical Narrative:    Pt will transition to home today. Home health services to follow, [place] within 48 hrs. Rolling walker will be delivered to bedside prior to d/c.  Pt has transportation to home, wife.  Lonza Shimabukuro      [phone]        Final next level of care: Home w Home Health Services Barriers to Discharge: No Barriers Identified   Patient Goals and CMS Choice Patient states their goals for this hospitalization and ongoing recovery are:: to go home and  get better CMS Medicare.gov Compare Post Acute Care list provided to:: (P) Patient Choice offered to / list presented to : Patient  Discharge Placement     Discharge Plan and Services   Discharge Planning Services: CM Consult            DME Arranged: Gilford Rile rolling DME Agency: [employer] Date DME Agency Contacted: 09/04/19 Time DME Agency Contacted: 101 Representative spoke with at DME Agency: [place]: PT, OT [place] Agency: [place] ([place]) Date [place]: 09/03/19 Time [place]: [place] Representative spoke with at [place]: [place] ([place]) Interventions     Readmission Risk Interventions No flowsheet data found.

## 2019-09-04 NOTE — Plan of Care (Signed)

## 2019-09-04 NOTE — Progress Notes (Signed)
Subjective: Patient sitting comfortably in bed. Discussed with patient the plan for discharge from hospital today with close follow-up in the outpatient setting with Rad/Onc.Biopsy results are still pending. Also discussed need for glycemic control with continued insulin after discharge. Patient was pleasant during discussion and was pleased to hear of our plan to discharge him today. He would like to discuss our discharge treatment plan further with his wife and then talk to [place] later. No acute changes today. No other complaints or concerns.   Objective:  Vital signs in last 24 hours: Vitals:   09/03/19 2006 09/04/19 0009 09/04/19 0504 09/04/19 0750  BP: 117/69 138/63 (!) 159/84 (!) 159/73  Pulse: 61 (!) 58 76 65  Resp: 17 (!) 21 18 20   Temp: 97.8 F (36.6 C) 97.9 F (36.6 C) 97.8 F (36.6 C) 97.8 F (36.6 C)  TempSrc:   Oral Oral  SpO2: 96% 95% 98% 95%  Weight:      Height:       Weight change:   Intake/Output Summary (Last 24 hours) at 09/04/2019 1052 Last data filed at 09/04/2019 1000 Gross per 24 hour  Intake 920 ml  Output -  Net 920 ml   CBC    Component Value Date/Time   WBC 13.5 (H) 09/04/2019 0207   RBC 3.75 (L) 09/04/2019 0207   HGB 11.8 (L) 09/04/2019 0207   HGB 15.9 12/23/2008 0921   HCT 33.9 (L) 09/04/2019 0207   HCT 45.6 12/23/2008 0921   PLT 315 09/04/2019 0207   PLT 281 12/23/2008 0921   MCV 90.4 09/04/2019 0207   MCV 93.9 12/23/2008 0921   MCH 31.5 09/04/2019 0207   MCHC 34.8 09/04/2019 0207   RDW 12.1 09/04/2019 0207   RDW 12.9 12/23/2008 0921   LYMPHSABS 1.4 09/01/2019 1348   LYMPHSABS 1.8 12/23/2008 0921   MONOABS 0.9 09/01/2019 1348   MONOABS 0.4 12/23/2008 0921   EOSABS 0.7 (H) 09/01/2019 1348   EOSABS 0.1 12/23/2008 0921   BASOSABS 0.1 09/01/2019 1348   BASOSABS 0.1 12/23/2008 0921   BMP Latest Ref Rng & Units 09/04/2019 09/03/2019 09/02/2019  Glucose 70 - 99 mg/dL 145(H) 125(H) 138(H)  BUN 8 - 23 mg/dL 15 13 11   Creatinine 0.61 - 1.24  mg/dL 1.02 1.02 0.95  Sodium 135 - 145 mmol/L 137 138 137  Potassium 3.5 - 5.1 mmol/L 4.1 4.2 4.5  Chloride 98 - 111 mmol/L 101 99 99  CO2 22 - 32 mmol/L 26 26 25   Calcium 8.9 - 10.3 mg/dL 9.0 9.1 8.7(L)     Assessment/Plan: Mr. 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  Vitals were reviewed and stable today. Labs were reviewed and showed leukocytosis and elevated glucose level, with both being likely related to steroids.  Active Problems:   Right sided weakness   #Metastatic brain masses - US-guided FNA of left supraclavicular lymph node by IR yesterday; results are pending. - Plan for outpatient follow-up with Rad/Onc with plan for discharge today. Per review of their notes, plan for SRS versus whole brain radiation pending biopsy results. - Neurosurgery recommends possible stereotactic radiosurgery as an outpatient once pathology results are known.  - Patient would like to discuss treatment regimen after discharge with his wife. Our recommendation is as follows: - Continue Keppra 500 mg twice daily - Continue Decadron 6 mg twice daily - DVT prophylaxis  with Lovenox, 40 mg SQ qD  #HTN -Home medsLisinoprilandmetoprolol -Continue metoprolol  #HFrEF -Continuehomeaspirin and statin -Holding Lasix due to soft BPs  #T2DM - Discussed with patient that we recommend glycemic control with insulin until steroid regimen has been completed.  - Patient would like to discuss treatment regimen after discharge with his wife. Our recommendation is as follows: - Resume home meds glipizide and metformin at discharge. - Continue basal insulin (aspart) regimen if patient is comfortable with using an insulin pen. Otherwise, we would consider increasing his glipizide.     LOS: 3 days    Orvis Brill, Medical Student 09/04/2019, 10:52 AM

## 2019-09-04 NOTE — Progress Notes (Signed)
Oncology Nurse Navigator Documentation  Oncology Nurse Navigator Flowsheets 09/04/2019  Navigator Location [facility]  Referral Date to RadOnc/MedOnc -  Navigator Encounter Type Telephone;Other:  Telephone Outgoing Call/I received notification that patient is being discharged today and needs an appt with Dr. Julien Nordmann for Dr. Irene Limbo.  I called hospital and spoke with nurse taking care of Geoffrey Benjamin.  I updated her on appt for tomorrow with Dr. Julien Nordmann and explained it would be on discharge planning.  Nurse was not clear if he would be discharged today.   Patient Visit Type Inpatient  Treatment Phase Pre-Tx/Tx Discussion  Barriers/Navigation Needs Coordination of Care;Education  Education Other  Interventions Coordination of Care;Education  Acuity Level 3-Moderate Needs (3-4 Barriers Identified)  Coordination of Care Appts;Other  Education Method Verbal  Time Spent with Patient 30

## 2019-09-04 NOTE — Progress Notes (Signed)
Occupational Therapy Treatment Patient Details Name: Geoffrey Benjamin MRN: [id number] DOB: [birth year] Today's Date: 09/04/2019    History of present illness 78 year old smoker admitted with sudden onset right-sided weakness slurred speech and right facial droop.  Head CT showed edema in the left posterior frontal and parietal lobes.  CT angiogram showed 44mm left parietal mass with vasogenic edema.  MRI showed 5 brain metastases, largest was a 2.5 cm enhancing lesion in the left parietal lobe with extensive edema.  And imaging of his chest and abdomen which showed bulky mediastinal lymphadenopathy and multiple pulmonary nodules.   OT comments  Focus of session on educating pt and wife in safety, fall prevention, incorporating use of R UE in ADL and compensatory strategies for ADL given R side weakness. Wife and pt verbalized understanding.   Follow Up Recommendations  Home health OT;Supervision/Assistance - 24 hour    Equipment Recommendations  (Wife requesting a RW vs cane)    Recommendations for Other Services      Precautions / Restrictions Precautions Precautions: Fall       Mobility Bed Mobility Overal bed mobility: Modified Independent             General bed mobility comments: HOB up  Transfers Overall transfer level: Needs assistance Equipment used: None Transfers: Sit to/from Stand Sit to Stand: Min guard              Balance Overall balance assessment: Needs assistance   Sitting balance-Leahy Scale: Good       Standing balance-Leahy Scale: Fair Standing balance comment: supervision for static standing balance without UE support, min guard/min assist for dynamic balance during ambulation                           ADL either performed or assessed with clinical judgement   ADL Overall ADL's : Needs assistance/impaired Eating/Feeding: Set up;Sitting Eating/Feeding Details (indicate cue type and reason): L hand leading primarily, used R for  cup to mouth, educated pt to avoid holding hot items with R hand Grooming: Wash/dry hands;Standing;Min guard Grooming Details (indicate cue type and reason): worked on squeezing out Teacher, adult education Dressing Details (indicate cue type and reason): He was able to don socks one handed with increased time             Functional mobility during ADLs: Min guard General ADL Comments: Educated pt and wife in dressing R side first and then L, benefits of elastic waist pants and to sit for bathing.     Vision       Perception     Praxis      Cognition Arousal/Alertness: Awake/alert Behavior During Therapy: WFL for tasks assessed/performed Overall Cognitive Status: Impaired/Different from baseline Area of Impairment: Safety/judgement                         Safety/Judgement: Decreased awareness of safety              Exercises     Shoulder Instructions       General Comments      Pertinent Vitals/ Pain       Pain Assessment: Faces Faces Pain Scale: Hurts a little bit Pain Location: all over Pain Descriptors / Indicators: Sore Pain Intervention(s): Repositioned  Home Living Family/patient expects to be discharged to:: Private residence Living Arrangements: Spouse/significant other  Prior Functioning/Environment              Frequency  Min 2X/week        Progress Toward Goals  OT Goals(current goals can now be found in the care plan section)  Progress towards OT goals: Progressing toward goals  Acute Rehab OT Goals Patient Stated Goal: to go home OT Goal Formulation: With patient Time For Goal Achievement: 09/17/19 Potential to Achieve Goals: Good  Plan Discharge plan remains appropriate    Co-evaluation                 AM-PAC OT "6 Clicks" Daily Activity     Outcome Measure   Help from another person eating meals?: A Little Help from another person  taking care of personal grooming?: A Little Help from another person toileting, which includes using toliet, bedpan, or urinal?: A Little Help from another person bathing (including washing, rinsing, drying)?: A Little Help from another person to put on and taking off regular upper body clothing?: A Little Help from another person to put on and taking off regular lower body clothing?: A Little 6 Click Score: 18    End of Session Equipment Utilized During Treatment: Gait belt  OT Visit Diagnosis: Unsteadiness on feet (R26.81);Other abnormalities of gait and mobility (R26.89);Muscle weakness (generalized) (M62.81);Other symptoms and signs involving cognitive function   Activity Tolerance Patient tolerated treatment well   Patient Left in chair;with call bell/phone within reach;with family/visitor present   Nurse Communication          Time: 0383-3383 OT Time Calculation (min): 22 min  Charges: OT General Charges $OT Visit: 1 Visit OT Treatments $Self Care/Home Management : 8-22 mins  Nestor Lewandowsky, OTR/L Acute Rehabilitation Services Pager: [phone] Office: [phone]   Malka So 09/04/2019, 1:13 PM

## 2019-09-04 NOTE — Telephone Encounter (Signed)
Dr Lisbeth Renshaw is requesting an inpt consult ASAP . Norton Blizzard aware and is involved.

## 2019-09-04 NOTE — Care Management Important Message (Signed)
Important Message  Patient Details  Name: Geoffrey Benjamin MRN: [id number] Date of Birth: Sep 02, 1941   Medicare Important Message Given:  Yes     Orbie Pyo 09/04/2019, 2:18 PM

## 2019-09-05 ENCOUNTER — Inpatient Hospital Stay: Payer: Medicare Other | Attending: Internal Medicine | Admitting: Internal Medicine

## 2019-09-05 ENCOUNTER — Encounter: Payer: Self-pay | Admitting: *Deleted

## 2019-09-05 ENCOUNTER — Inpatient Hospital Stay: Payer: Medicare Other

## 2019-09-05 ENCOUNTER — Encounter: Payer: Self-pay | Admitting: Internal Medicine

## 2019-09-05 ENCOUNTER — Other Ambulatory Visit: Payer: Self-pay

## 2019-09-05 VITALS — BP 116/71 | HR 87 | Temp 98.5°F | Resp 17 | Ht 73.0 in | Wt 161.7 lb

## 2019-09-05 DIAGNOSIS — C7931 Secondary malignant neoplasm of brain: Secondary | ICD-10-CM | POA: Diagnosis not present

## 2019-09-05 DIAGNOSIS — I110 Hypertensive heart disease with heart failure: Secondary | ICD-10-CM

## 2019-09-05 DIAGNOSIS — C3491 Malignant neoplasm of unspecified part of right bronchus or lung: Secondary | ICD-10-CM | POA: Diagnosis not present

## 2019-09-05 DIAGNOSIS — C3490 Malignant neoplasm of unspecified part of unspecified bronchus or lung: Secondary | ICD-10-CM

## 2019-09-05 DIAGNOSIS — I509 Heart failure, unspecified: Secondary | ICD-10-CM

## 2019-09-05 DIAGNOSIS — E119 Type 2 diabetes mellitus without complications: Secondary | ICD-10-CM

## 2019-09-05 DIAGNOSIS — I2510 Atherosclerotic heart disease of native coronary artery without angina pectoris: Secondary | ICD-10-CM

## 2019-09-05 DIAGNOSIS — R0609 Other forms of dyspnea: Secondary | ICD-10-CM

## 2019-09-05 DIAGNOSIS — F17210 Nicotine dependence, cigarettes, uncomplicated: Secondary | ICD-10-CM

## 2019-09-05 DIAGNOSIS — C3492 Malignant neoplasm of unspecified part of left bronchus or lung: Secondary | ICD-10-CM | POA: Diagnosis not present

## 2019-09-05 DIAGNOSIS — H538 Other visual disturbances: Secondary | ICD-10-CM

## 2019-09-05 DIAGNOSIS — C770 Secondary and unspecified malignant neoplasm of lymph nodes of head, face and neck: Secondary | ICD-10-CM

## 2019-09-05 DIAGNOSIS — K219 Gastro-esophageal reflux disease without esophagitis: Secondary | ICD-10-CM

## 2019-09-05 DIAGNOSIS — R05 Cough: Secondary | ICD-10-CM

## 2019-09-05 DIAGNOSIS — Z7189 Other specified counseling: Secondary | ICD-10-CM

## 2019-09-05 DIAGNOSIS — R531 Weakness: Secondary | ICD-10-CM

## 2019-09-05 DIAGNOSIS — Z720 Tobacco use: Secondary | ICD-10-CM

## 2019-09-05 LAB — SURGICAL PATHOLOGY

## 2019-09-05 NOTE — Progress Notes (Signed)
Oncology Nurse Navigator Documentation  Oncology Nurse Navigator Flowsheets 09/05/2019  Navigator Location [facility]  Referral Date to RadOnc/MedOnc -  Navigator Encounter Type Other/I contacted pathology to get an update on tissue dx.  I updated Dr. Julien Nordmann and he requested me to have pathology send for Foundation One and PDL 1 testing.  I did.   Telephone -  Patient Visit Type -  Treatment Phase -  Barriers/Navigation Needs Coordination of Care  Education -  Interventions Coordination of Care  Acuity Level 2-Minimal Needs (1-2 Barriers Identified)  Coordination of Care Other  Education Method -  Time Spent with Patient 45

## 2019-09-05 NOTE — Progress Notes (Signed)
Oncology Nurse Navigator Documentation  Oncology Nurse Navigator Flowsheets 09/05/2019  Abnormal Finding Date 09/01/2019  Confirmed Diagnosis Date 09/03/2019  Diagnosis Status Pathology Pending  Planned Course of Treatment Chemotherapy;Radiation  Navigator Follow Up Date: 09/09/2019  Navigator Follow Up Reason: Appointment Review  Navigator Location [facility]  Referral Date to RadOnc/MedOnc -  Navigator Encounter Type Clinic/MDC/spoke with patient and wife today at thoracic clinic.  I gave and explained information on Dx and next steps.   Coordinate appt and forms for blood test [employer].    Telephone -  [facility] Date 09/05/2019  Multidisiplinary Clinic Type Thoracic  Patient Visit Type MedOnc  Treatment Phase Pre-Tx/Tx Discussion  Barriers/Navigation Needs Coordination of Care;Education  Education Newly Diagnosed Cancer Education;Other  Interventions Coordination of Care;Education  Acuity Level 3-Moderate Needs (3-4 Barriers Identified)  Coordination of Care Appts;Other  Education Method Verbal;Written  Support Groups/Services Other  Time Spent with Patient 53

## 2019-09-05 NOTE — Progress Notes (Signed)
Benjamin Perez [phone]   [phone] Multidisciplinary thoracic oncology clinic  CONSULT NOTE  REFERRING PHYSICIAN: Dr. Johnnette Gourd  REASON FOR CONSULTATION:  78 years old white male recently diagnosed with lung cancer.  HPI Geoffrey SHAMOON is a 78 y.o. male with past medical history significant for atrial stenosis, coronary artery disease, congestive heart failure, diabetes mellitus, GERD, hypertension as well as dyslipidemia and long history of smoking.  The patient presented to the emergency department at [facility] complaining of weakness of the right arm on September 01, 2019.  CT of the head was performed on the same day and it showed extensive edema in the left posterior frontal and partial lobes with vasogenic appearance suggesting underlying mass or inflammation/infection.  This was followed by MRI of the brain on the same day and that showed 5 brain metastasis most notably a 2.5 cm enhancing lesion in the left parietal lobe with extensive vasogenic edema.  On September 02, 2019 the patient underwent CT of the chest, abdomen pelvis for restaging of his disease and that showed bulky mediastinal lymphadenopathy with extensive right paratracheal adenopathy, the largest node along the right paratracheal change measuring 1.9 cm.  There was a small left supraclavicular lymph nodes with rounded morphology the largest measure 1.0 cm and scattered small right supraclavicular lymph nodes as well.  There was also a 1.9 cm left juxta hilar lymph node.  There was also enlargement of subcarinal lymph node measuring up to 2.0 cm in short axis.  The lung windows showed right lower lobe pulmonary nodule measuring 1.3 cm, right middle lobe nodule measuring 0.7 cm, 0.4 cm left lower pulmonary nodules and at least 4 additional pulmonary nodules in the left chest.  There was pleural and parenchymal distortion of the posterior right chest measuring approximately 3.3 x 1.6  cm.  There was also 6 right upper lobe pulmonary nodules greater than 5 mm with at least 2-3 additional small nodules in the right upper lobe.  On September 03, 2019 the patient underwent ultrasound-guided core biopsy of the right supraclavicular lymph node by interventional radiology and the final pathology ([id number]) showed metastatic adenocarcinoma. By immunohistochemistry the neoplastic cells are positive for cytokeratin 7 and TTF-1 but negative for cytokeratin 20, CDX2, PSA, f, cytokeratin 5/6 and p40. 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  The patient was seen by Dr. Lisbeth Renshaw from radiation oncology and he is expected to start [place] to the metastatic brain lesions in the next 1-2 weeks.  He is currently on a tapered dose of Decadron. He was referred to me today for evaluation and recommendation regarding treatment of his condition. When seen today the patient continues to complain of the weakness in the right upper extremity and to a lesser extent in the leg.  He has shortness of breath with exertion as well as mild cough but no significant chest pain.  He has no fever or chills.  He has no headache but has blurry vision.  He denied having any nausea, vomiting, diarrhea or constipation. Family history significant for father with kidney disease and mother had a stroke. The patient is married and has 2 living children, daughters.  He was accompanied today by his wife [name].  He used to work as a Brewing technologist.  He has a history for smoking more than 1 pack/day for around 65 years and unfortunately continues to smoke and trying to quit.  He has no history of alcohol or  drug abuse.  HPI  Past Medical History:  Diagnosis Date  . Aortic stenosis 09/13/2018  . CAP (community acquired pneumonia) 09/13/2018  . CHF (congestive heart failure) ([place])   . Difficulty walking   . DM (diabetes mellitus) ([place])   . GERD 06/04/2009   Qualifier: Diagnosis of  By: Henrene Pastor MD, Docia Chuck   . Hyperlipidemia 09/13/2018  . Hypertension   . Loss of weight 09/29/2008   Qualifier: Diagnosis of  By: Bobby Rumpf CMA (AAMA), [place]    . PERSONAL HX COLONIC POLYPS 06/04/2009   Qualifier: Diagnosis of  By: Henrene Pastor MD, [place] PAIN 09/29/2008   Qualifier: Diagnosis of  By: Bobby Rumpf CMA (AAMA), Patty    . Tachycardia   . Tobacco use 09/13/2018  . Volume overload 09/13/2018    Past Surgical History:  Procedure Laterality Date  . CATARACT EXTRACTION, BILATERAL  08/2016  . DOPPLER ECHOCARDIOGRAPHY     Mitral inflow and tissue doppler consistent with impaired LV relaxation; Trileaflet aortic vlave with moderate aortic valve stenosis, Trivial mitral and tricuspid valve regurgitation.    Family History  Problem Relation Age of Onset  . Kidney disease Father   . Stroke Mother     Social History Social History   Tobacco Use  . Smoking status: Current Every Day Smoker    Packs/day: 1.50    Types: Cigarettes  . Smokeless tobacco: Never Used  . Tobacco comment: SMOKED FOR 60 PLUS YEARS  Substance Use Topics  . Alcohol use: Yes    Comment: occas  . Drug use: Never    No Known Allergies  Current Outpatient Medications  Medication Sig Dispense Refill  . apixaban (ELIQUIS) 2.5 MG TABS tablet Take 1 tablet (2.5 mg total) by mouth 2 (two) times daily. 60 tablet 2  . aspirin EC 81 MG tablet Take 81 mg by mouth daily.    Marland Kitchen atorvastatin (LIPITOR) 80 MG tablet Take 80 mg by mouth daily.    Marland Kitchen dexamethasone (DECADRON) 6 MG tablet Take 1 tablet (6 mg total) by mouth 2 (two) times daily. 60 tablet 2  . glipiZIDE (GLUCOTROL) 5 MG tablet Take 5 mg by mouth daily before breakfast.    . levETIRAcetam (KEPPRA) 500 MG tablet Take 1 tablet (500 mg total) by mouth 2 (two) times daily. 60 tablet 3  . metFORMIN (GLUCOPHAGE) 1000 MG tablet Take 1 tablet (1,000 mg total) by mouth 2 (two) times daily with a meal. 30 tablet 0  . metoprolol tartrate (LOPRESSOR) 25 MG tablet Take 1 tablet (25 mg total) by  mouth 2 (two) times daily. 180 tablet 3  . mirtazapine (REMERON) 15 MG tablet Take 15 mg by mouth at bedtime.    . Omega-3 Fatty Acids (FISH OIL) 1200 MG CAPS Take 1,200 mg by mouth daily.    . pantoprazole (PROTONIX) 40 MG tablet Take 1 tablet (40 mg total) by mouth daily. (Patient taking differently: Take 40 mg by mouth every evening. ) 30 tablet 11   No current facility-administered medications for this visit.     Review of Systems  Constitutional: positive for fatigue Eyes: negative Ears, nose, mouth, throat, and face: negative Respiratory: positive for cough and dyspnea on exertion Cardiovascular: negative Gastrointestinal: negative Genitourinary:negative Integument/breast: negative Hematologic/lymphatic: negative Musculoskeletal:negative Neurological: positive for weakness Behavioral/Psych: negative Endocrine: negative Allergic/Immunologic: negative  Physical Exam  ION:GEXBM, healthy, no distress, well nourished and well developed SKIN: skin color, texture, turgor are normal, no rashes or significant lesions HEAD: Normocephalic, No  masses, lesions, tenderness or abnormalities EYES: normal, PERRLA, Conjunctiva are pink and non-injected EARS: External ears normal, Canals clear OROPHARYNX:no exudate, no erythema and lips, buccal mucosa, and tongue normal  NECK: supple, no adenopathy, no JVD LYMPH:  no palpable lymphadenopathy, no hepatosplenomegaly LUNGS: clear to auscultation , and palpation HEART: regular rate & rhythm, no murmurs and no gallops ABDOMEN:abdomen soft, non-tender, normal bowel sounds and no masses or organomegaly BACK: No CVA tenderness, Range of motion is normal EXTREMITIES:no joint deformities, effusion, or inflammation, no edema  NEURO: Creased muscle strength in the right upper extremity.  PERFORMANCE STATUS: ECOG 1  LABORATORY DATA: Lab Results  Component Value Date   WBC 13.5 (H) 09/04/2019   HGB 11.8 (L) 09/04/2019   HCT 33.9 (L) 09/04/2019    MCV 90.4 09/04/2019   PLT 315 09/04/2019      Chemistry      Component Value Date/Time   NA 137 09/04/2019 0207   K 4.1 09/04/2019 0207   CL 101 09/04/2019 0207   CO2 26 09/04/2019 0207   BUN 15 09/04/2019 0207   CREATININE 1.02 09/04/2019 0207      Component Value Date/Time   CALCIUM 9.0 09/04/2019 0207   ALKPHOS 51 09/01/2019 1348   AST 15 09/01/2019 1348   ALT 15 09/01/2019 1348   BILITOT 0.5 09/01/2019 1348       RADIOGRAPHIC STUDIES: Ct Code Stroke Cta Head W/wo Contrast  Result Date: 09/01/2019 CLINICAL DATA:  Slurred speech and facial droop EXAM: CT ANGIOGRAPHY HEAD AND NECK TECHNIQUE: Multidetector CT imaging of the head and neck was performed using the standard protocol during bolus administration of intravenous contrast. Multiplanar CT image reconstructions and MIPs were obtained to evaluate the vascular anatomy. Carotid stenosis measurements (when applicable) are obtained utilizing NASCET criteria, using the distal internal carotid diameter as the denominator. CONTRAST:  56m OMNIPAQUE IOHEXOL 350 MG/ML SOLN COMPARISON:  None. FINDINGS: CTA NECK FINDINGS Aortic arch: Extensive atherosclerotic plaque which is irregular. Three vessel branching. Right carotid system: Atheromatous wall thickening of the brachiocephalic to common carotid bifurcation. Atherosclerotic plaque at the bifurcation is moderate and mainly calcified. Right ICA origin stenosis measures 50%. Left carotid system: Atherosclerotic plaque mainly at the common carotid and ICA origins without flow limiting stenosis or ulceration. Vertebral arteries: Extensive proximal subclavian atherosclerosis without flow limiting stenosis. Atherosclerotic plaque on the vertebral arteries on both sides without flow limiting stenosis or dissection. Skeleton: No acute or aggressive finding Other neck: Adenopathy in the bilateral supraclavicular fossae. Upper chest: Partially covered mediastinum shows bilateral adenopathy and  multiple right-sided pulmonary nodules. Review of the MIP images confirms the above findings CTA HEAD FINDINGS Anterior circulation: Diffuse atherosclerotic plaque on the carotid siphons. No branch occlusion or flow limiting stenosis. Negative for aneurysm Posterior circulation: Atherosclerotic plaque on the vertebral arteries. The vertebrobasilar arteries are smooth and widely patent. No PCA branch stenosis or occlusion. Negative for aneurysm Venous sinuses: Diffusely patent Anatomic variants: None significant Delayed phase: Delayed phase shows an avidly enhancing cortically based mass in the left high parietal lobe measuring 16 mm. A second mass is not seen. Case discussed with Dr. KLeonel Ramsaywho is already aware of the findings. Review of the MIP images confirms the above findings IMPRESSION: 1. 16 mm left parietal mass with extensive vasogenic edema. The mass is considered secondary to metastatic disease given adenopathy is seen in the mediastinum/bilateral supraclavicular fossae and there are right upper lobe nodules. 2. Atherosclerosis without emergent vascular finding. 3. 50% right ICA origin  stenosis. Electronically Signed   By: Monte Fantasia M.D.   On: 09/01/2019 14:25   Ct Code Stroke Cta Neck W/wo Contrast  Result Date: 09/01/2019 CLINICAL DATA:  Slurred speech and facial droop EXAM: CT ANGIOGRAPHY HEAD AND NECK TECHNIQUE: Multidetector CT imaging of the head and neck was performed using the standard protocol during bolus administration of intravenous contrast. Multiplanar CT image reconstructions and MIPs were obtained to evaluate the vascular anatomy. Carotid stenosis measurements (when applicable) are obtained utilizing NASCET criteria, using the distal internal carotid diameter as the denominator. CONTRAST:  39m OMNIPAQUE IOHEXOL 350 MG/ML SOLN COMPARISON:  None. FINDINGS: CTA NECK FINDINGS Aortic arch: Extensive atherosclerotic plaque which is irregular. Three vessel branching. Right carotid  system: Atheromatous wall thickening of the brachiocephalic to common carotid bifurcation. Atherosclerotic plaque at the bifurcation is moderate and mainly calcified. Right ICA origin stenosis measures 50%. Left carotid system: Atherosclerotic plaque mainly at the common carotid and ICA origins without flow limiting stenosis or ulceration. Vertebral arteries: Extensive proximal subclavian atherosclerosis without flow limiting stenosis. Atherosclerotic plaque on the vertebral arteries on both sides without flow limiting stenosis or dissection. Skeleton: No acute or aggressive finding Other neck: Adenopathy in the bilateral supraclavicular fossae. Upper chest: Partially covered mediastinum shows bilateral adenopathy and multiple right-sided pulmonary nodules. Review of the MIP images confirms the above findings CTA HEAD FINDINGS Anterior circulation: Diffuse atherosclerotic plaque on the carotid siphons. No branch occlusion or flow limiting stenosis. Negative for aneurysm Posterior circulation: Atherosclerotic plaque on the vertebral arteries. The vertebrobasilar arteries are smooth and widely patent. No PCA branch stenosis or occlusion. Negative for aneurysm Venous sinuses: Diffusely patent Anatomic variants: None significant Delayed phase: Delayed phase shows an avidly enhancing cortically based mass in the left high parietal lobe measuring 16 mm. A second mass is not seen. Case discussed with Dr. KLeonel Ramsaywho is already aware of the findings. Review of the MIP images confirms the above findings IMPRESSION: 1. 16 mm left parietal mass with extensive vasogenic edema. The mass is considered secondary to metastatic disease given adenopathy is seen in the mediastinum/bilateral supraclavicular fossae and there are right upper lobe nodules. 2. Atherosclerosis without emergent vascular finding. 3. 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 Electronically Signed   By: JMonte FantasiaM.D.   On: 09/01/2019 14:25   Ct Chest W  Contrast  Result Date: 09/02/2019 CLINICAL DATA:  Staging evaluation for multiple brain metastases. EXAM: CT CHEST, ABDOMEN, AND PELVIS WITH CONTRAST TECHNIQUE: Multidetector CT imaging of the chest, abdomen and pelvis was performed following the standard protocol during bolus administration of intravenous contrast. CONTRAST:  1066mOMNIPAQUE IOHEXOL 300 MG/ML  SOLN COMPARISON:  Chest, abdomen and pelvis exam of March of 2010. No recent cross-sectional imaging aside from neuro axis imaging is available for comparison FINDINGS: CT CHEST FINDINGS Cardiovascular: Scattered calcific and noncalcific atherosclerotic changes throughout the thoracic aorta of progressed since the previous study. Large areas of noncalcified plaque are demonstrated along the descending thoracic aorta. No signs of aneurysmal dilation. Heart size mildly enlarged with signs of mitral annular and aortic valvular calcifications. Calcified coronary artery disease. No pericardial effusion. Central pulmonary vasculature is normal. Mediastinum/Nodes: Bulky mediastinal lymphadenopathy. Extensive right paratracheal adenopathy, largest lymph node along the right paratracheal chain measuring 19 mm short axis (image 26, series 3) Small left supraclavicular lymph nodes with rounded morphology largest 1 cm (image 7, series 3) scattered small right supraclavicular lymph nodes as well. (Image 29, series 3) 19 mm left juxta hilar lymph node. Enlargement  of subcarinal lymph nodes up to 2 cm in short axis as well. Lungs/Pleura: Signs of paraseptal and centrilobular emphysema. Multiple pulmonary nodules with the distribution compatible with metastatic disease. Right lower lobe pulmonary nodule (image 91, series 4) 1.3 cm. Right middle lobe nodule measuring 7 mm. 4 mm left lower lobe pulmonary nodule and at least 4 additional pulmonary nodules in the left chest. Pleural and parenchymal distortion of posterior right chest measuring approximately 3.3 x 1.6 cm. Six  right upper lobe pulmonary nodules greater than 5 mm with at least 2-3 additional small nodules in the right upper lobe. Musculoskeletal: No signs of chest wall mass. CT ABDOMEN PELVIS FINDINGS Hepatobiliary: No signs of focal hepatic lesion. Sludge, small stones in likely vicarious excretion previously administered contrast in the dependent gallbladder, no signs of pericholecystic stranding. No signs of biliary ductal dilation. Pancreas: Unremarkable. No pancreatic ductal dilatation or surrounding inflammatory changes. Spleen: Normal in size without focal abnormality. Adrenals/Urinary Tract: Normal adrenal glands. Cyst arising from upper pole left kidney. No signs of hydronephrosis. Symmetric renal enhancement. Stomach/Bowel: Small hiatal hernia. No signs of bowel obstruction or acute bowel process. Signs of colonic diverticulosis mainly in descending and sigmoid colon. Vascular/Lymphatic: Extensive calcified and noncalcified plaque in the abdominal aorta. Maximal caliber of the abdominal aorta 3.5 x 3.3 cm. 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 Major visceral branches are patent. No signs of upper abdominal adenopathy. No signs of pelvic lymphadenopathy. Reproductive: Question hyperenhancement of left hemi prostate. This could also be related to calcification within the gland, nonspecific finding overall. Other: No signs of free air or acute intra-abdominal process such as abscess or ascites. Musculoskeletal: Spinal degenerative change. No acute or destructive bone process. IMPRESSION: 1. Bulky mediastinal adenopathy in supraclavicular adenopathy with multiple pulmonary nodules compatible with metastatic disease. 2. Process potentially related to primary lung cancer. 3. 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 This may be a chronic finding but was not present in 2010. Consider correlation with any symptoms of pain in this area and follow-up vascular surgery consultation, more urgent if the patient is experiencing symptoms that would suggest instability of this process, i.e. acute back pain. 4. Question hyperenhancement of the left hemiprostate. This could also be related to calcification within the gland, nonspecific overall finding. Correlation with PSA may be helpful given other findings on today's scan that are compatible with metastatic disease. 5. Emphysema and aortic atherosclerosis. Aortic Atherosclerosis (ICD10-I70.0) and Emphysema (ICD10-J43.9). Electronically Signed   By: Zetta Bills M.D.   On: 09/02/2019 13:52   Mr Jeri Cos AJ Contrast  Result Date: 09/02/2019 CLINICAL DATA:  78 year old male stereotactic radio surgery planning study for presumed brain metastases demonstrated on MRI yesterday. EXAM: MRI HEAD WITHOUT AND WITH CONTRAST TECHNIQUE: Multiplanar, multiecho pulse sequences of the brain and surrounding structures were obtained without and with intravenous contrast. CONTRAST:  11m GADAVIST GADOBUTROL 1 MMOL/ML IV SOLN COMPARISON:  Brain MRI 09/01/2019. FINDINGS: Considering complete MRI without and with contrast yesterday, DWI and SWI imaging was omitted today. Brain: The five enhancing brain metastases demonstrated yesterday are re-identified and stable, with size ranging from punctate 2 millimeter metastasis of the left posterior frontal operculum (series 6, image 99) to the curvilinear approximately 22  millimeter partially surface based metastasis of the ventral left parietal lobe near perirolandic cortex (series 6, image 121). See also series 6, image 93 (junction of the right posterior temporal and parietal lobes), series 6, image 22 (inferior right cerebellum), and image 40  (mid left cerebellum). There is also a 2-3 millimeter central right superior cerebellar metastasis on series 6, image 46. But no other abnormal intracranial enhancement is identified. No dural thickening aside from a possible short segment of involvement at the dominant left superior hemisphere lesion. Stable vasogenic edema and mass effect in the left hemisphere. Partially effaced left lateral ventricle. No ventriculomegaly. Minimal rightward midline shift. Normal basilar cisterns. Trace right inferior cerebellar edema (series 4, image 9). Stable gray and white matter signal elsewhere. Vascular: Major intracranial vascular flow voids are stable. Generalized intracranial artery tortuosity. The major dural venous sinuses are enhancing and appear to be patent. Skull and upper cervical spine: Negative visible cervical spine and spinal cord. Visualized bone marrow signal is within normal limits. Sinuses/Orbits: Stable, negative. Other: Mastoids remain clear. Visible internal auditory structures appear normal. IMPRESSION: 1. Study for stereotactic treatment planning. One additional 2-3 mm cerebellar metastasis is identified, bringing the total enhancing brain metastases to Six. All lesions annotated on series 6. 2. Stable dominant lesion in the posterior left hemisphere which is peripherally located with possible focal dural involvement. Stable vasogenic edema and mild intracranial mass effect. Electronically Signed   By: Genevie Ann M.D.   On: 09/02/2019 21:59   Mr Jeri Cos XB Contrast  Result Date: 09/01/2019 CLINICAL DATA:  Stroke follow-up. Right-sided weakness and difficulty with speech. EXAM: MRI HEAD WITHOUT AND WITH CONTRAST TECHNIQUE: Multiplanar, multiecho pulse sequences of the brain and surrounding structures were obtained without and with intravenous contrast. CONTRAST:  69m GADAVIST GADOBUTROL 1 MMOL/ML IV SOLN COMPARISON:  CT and CTA from earlier today FINDINGS: Brain: Left frontal parietal mass with avid  enhancement and extensive vasogenic edema causing local mass effect. The mass has 2 different textures with a superficial more hypointense and hypoenhancing nodule and a more elongated avidly enhancing and fluid signal component extending through the white matter. The total, maximal length of the enhancing portion is 2.5 cm. Additional enhancing lesions: 1. 3 mm along the right parietal cerebral convexity on 10:31 2. Subcentimeter in the inferior right cerebellum on 10:8 3. Subcentimeter in the left cerebellum on 10:15. 4. Subcentimeter along the left frontal operculum on 10:26 and 12:20 This has a metastatic pattern which correlates well with the other findings on CTA of the neck. No acute infarct, acute hemorrhage, hydrocephalus, or extra-axial collection. Vascular: Preserved flow voids and vascular enhancements Skull and upper cervical spine: Negative for marrow lesion Sinuses/Orbits: Bilateral cataract resection. IMPRESSION: Five brain metastases, most notably a 2.5 cm enhancing lesion in the left parietal lobe with extensive vasogenic edema. Electronically Signed   By: JMonte FantasiaM.D.   On: 09/01/2019 17:02   Ct Abdomen Pelvis W Contrast  Result Date: 09/02/2019 CLINICAL DATA:  Staging evaluation for multiple brain metastases. EXAM: CT CHEST, ABDOMEN, AND PELVIS WITH CONTRAST TECHNIQUE: Multidetector CT imaging of the chest, abdomen and pelvis was performed following the standard protocol during bolus administration of intravenous contrast. CONTRAST:  1069mOMNIPAQUE IOHEXOL 300 MG/ML  SOLN COMPARISON:  Chest, abdomen and pelvis exam of March of 2010. No recent cross-sectional imaging aside from neuro axis imaging is available for comparison FINDINGS: CT CHEST FINDINGS Cardiovascular: Scattered calcific and noncalcific atherosclerotic changes throughout the thoracic aorta of progressed since  the previous study. Large areas of noncalcified plaque are demonstrated along the descending thoracic aorta. No  signs of aneurysmal dilation. Heart size mildly enlarged with signs of mitral annular and aortic valvular calcifications. Calcified coronary artery disease. No pericardial effusion. Central pulmonary vasculature is normal. Mediastinum/Nodes: Bulky mediastinal lymphadenopathy. Extensive right paratracheal adenopathy, largest lymph node along the right paratracheal chain measuring 19 mm short axis (image 26, series 3) Small left supraclavicular lymph nodes with rounded morphology largest 1 cm (image 7, series 3) scattered small right supraclavicular lymph nodes as well. (Image 29, series 3) 19 mm left juxta hilar lymph node. Enlargement of subcarinal lymph nodes up to 2 cm in short axis as well. Lungs/Pleura: Signs of paraseptal and centrilobular emphysema. Multiple pulmonary nodules with the distribution compatible with metastatic disease. Right lower lobe pulmonary nodule (image 91, series 4) 1.3 cm. Right middle lobe nodule measuring 7 mm. 4 mm left lower lobe pulmonary nodule and at least 4 additional pulmonary nodules in the left chest. Pleural and parenchymal distortion of posterior right chest measuring approximately 3.3 x 1.6 cm. Six right upper lobe pulmonary nodules greater than 5 mm with at least 2-3 additional small nodules in the right upper lobe. Musculoskeletal: No signs of chest wall mass. CT ABDOMEN PELVIS FINDINGS Hepatobiliary: No signs of focal hepatic lesion. Sludge, small stones in likely vicarious excretion previously administered contrast in the dependent gallbladder, no signs of pericholecystic stranding. No signs of biliary ductal dilation. Pancreas: Unremarkable. No pancreatic ductal dilatation or surrounding inflammatory changes. Spleen: Normal in size without focal abnormality. Adrenals/Urinary Tract: Normal adrenal glands. Cyst arising from upper pole left kidney. No signs of hydronephrosis. Symmetric renal enhancement. Stomach/Bowel: Small hiatal hernia. No signs of bowel obstruction or  acute bowel process. Signs of colonic diverticulosis mainly in descending and sigmoid colon. Vascular/Lymphatic: Extensive calcified and noncalcified plaque in the abdominal aorta. Maximal caliber of the abdominal aorta 3.5 x 3.3 cm. 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 Major visceral branches are patent. No signs of upper abdominal adenopathy. No signs of pelvic lymphadenopathy. Reproductive: Question hyperenhancement of left hemi prostate. This could also be related to calcification within the gland, nonspecific finding overall. Other: No signs of free air or acute intra-abdominal process such as abscess or ascites. Musculoskeletal: Spinal degenerative change. No acute or destructive bone process. IMPRESSION: 1. Bulky mediastinal adenopathy in supraclavicular adenopathy with multiple pulmonary nodules compatible with metastatic disease. 2. Process potentially related to primary lung cancer. 3. 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 This may be a chronic finding but was not present in 2010. Consider correlation with any symptoms of pain in this area and follow-up vascular surgery consultation, more urgent if the patient is experiencing symptoms that would suggest instability of this process, i.e. acute back pain. 4. Question hyperenhancement of the left hemiprostate. This could also be related to calcification within the gland, nonspecific overall finding. Correlation with PSA may be helpful given other findings on today's scan that are compatible with metastatic disease. 5. Emphysema and aortic atherosclerosis. Aortic Atherosclerosis (ICD10-I70.0) and Emphysema (ICD10-J43.9). Electronically Signed    By: Zetta Bills M.D.   On: 09/02/2019 13:52   [place] Core Biopsy (lymph Nodes)  Result Date: 09/03/2019 INDICATION: 78 year old  with evidence for metastatic disease. Patient has brain lesions, extensive chest lymphadenopathy, lung nodules and supraclavicular lymphadenopathy. Tissue diagnosis is needed. EXAM: ULTRASOUND-GUIDED BIOPSY OF RIGHT SUPRACLAVICULAR LYMPH NODE MEDICATIONS: None. ANESTHESIA/SEDATION: None FLUOROSCOPY TIME:  None COMPLICATIONS: None immediate. PROCEDURE: Informed written consent was obtained from the patient after a thorough discussion of the procedural risks, benefits and alternatives. All questions were addressed. A timeout was performed prior to the initiation of the procedure. The supraclavicular areas were evaluated with ultrasound. Multiple small abnormal right supraclavicular lymph nodes were identified. A lymph node lateral to the right internal jugular vein was targeted for ultrasound-guided biopsy. The neck was prepped with chlorhexidine and sterile field was created. Skin and soft tissues were anesthetized with 1% lidocaine. A small skin incision was made. Using ultrasound guidance, an 18 gauge [place] core needle was directed into a lymph node lateral to the right internal jugular vein. Total of 5 core biopsies were obtained and specimens were placed in saline. Bandage placed over the puncture site. FINDINGS: Multiple rounded hypoechoic lymph nodes in the right supraclavicular area. Core biopsies obtained from a suspicious lymph node lateral to the right internal jugular vein. No significant bleeding or hematoma formation following the core biopsies. Adequate core specimens obtained. IMPRESSION: Ultrasound-guided core biopsies from a right supraclavicular lymph node. Electronically Signed   By: Markus Daft M.D.   On: 09/03/2019 16:35   Ct Head Code Stroke Wo Contrast  Result Date: 09/01/2019 CLINICAL DATA:  Code stroke. EXAM: CT HEAD WITHOUT CONTRAST TECHNIQUE:  Contiguous axial images were obtained from the base of the skull through the vertex without intravenous contrast. COMPARISON:  None. FINDINGS: Brain: Extensive edema in the left posterior frontal and parietal hemisphere with gray matter sparing, cytotoxic appearing. A discrete mass is not clearly seen. No hemorrhage, hydrocephalus, or collection. Vascular: Atherosclerotic calcification.  No hyperdense vessel Skull: No acute finding. Periodontal erosions which are partially covered. Sinuses/Orbits: Bilateral cataract resection. Pending direct communication with Neurology team. ASPECTS [facility] Stroke Program Early CT Score) Not scored in this setting IMPRESSION: Extensive edema in the left posterior frontal and parietal lobes with vasogenic appearance suggesting underlying mass or inflammation/infection. Electronically Signed   By: Monte Fantasia M.D.   On: 09/01/2019 14:15    ASSESSMENT: This is a very pleasant 78 years old white male recently diagnosed with stage IV, T1b, N3, M1 C) non-small cell lung cancer, adenocarcinoma diagnosed in November 2020 and presented with multiple pulmonary nodules mainly in the right lung as well as the left lung in addition to mediastinal and supraclavicular lymphadenopathy and multiple metastatic brain lesions.   PLAN: I had a lengthy discussion with the patient and his wife today about his current disease stage, prognosis and treatment options. I recommended for the patient to complete the staging work-up by ordering a PET scan for further evaluation of his disease. I also requested the tissue block to [facility] sent to [facility] for molecular studies as well as PD-L1 expression.  In the meantime we will send a blood test to [facility] for molecular study because of expected delay with the tissue diagnosis. As the patient has no actionable mutation, I will consider him for treatment with a combination of systemic chemotherapy as well as immunotherapy. I  recommended for the patient to proceed with his treatment with SRS to the 5 brain lesions under the care of Dr. Lisbeth Renshaw.  He will continue with a tapered dose of Decadron. I will arrange for the patient to come back  for follow-up visit in less than 3 weeks for evaluation and more detailed discussion of her systemic therapy after completion of his brain radiation. The patient was seen by the thoracic navigator during his visit today. For smoking cessation, I strongly encouraged the patient to quit smoking. He was advised to call immediately if he has any concerning symptoms in the interval.  The patient voices understanding of current disease status and treatment options and is in agreement with the current care plan.  All questions were answered. The patient knows to call the clinic with any problems, questions or concerns. We can certainly see the patient much sooner if necessary.  Thank you so much for allowing me to participate in the care of GAY RAPE. I will continue to follow up the patient with you and assist in his care.  I spent 55 minutes counseling the patient face to face. The total time spent in the appointment was 80 minutes.  Disclaimer: This note was dictated with voice recognition software. Similar sounding words can inadvertently be transcribed and may not be corrected upon review.   Eilleen Kempf September 05, 2019, 2:01 PM

## 2019-09-06 ENCOUNTER — Telehealth: Payer: Self-pay | Admitting: Internal Medicine

## 2019-09-06 NOTE — Telephone Encounter (Signed)
Scheduled per los. Called and spoke with patient. Confirmed appt 

## 2019-09-09 ENCOUNTER — Inpatient Hospital Stay: Payer: Medicare Other

## 2019-09-10 ENCOUNTER — Other Ambulatory Visit: Payer: Self-pay

## 2019-09-10 ENCOUNTER — Ambulatory Visit: Payer: Medicare Other | Admitting: Radiation Oncology

## 2019-09-10 ENCOUNTER — Ambulatory Visit
Admission: RE | Admit: 2019-09-10 | Discharge: 2019-09-10 | Disposition: A | Discharge status: Home / Self Care | Payer: Medicare Other | Source: Ambulatory Visit | Attending: Radiation Oncology | Admitting: Radiation Oncology

## 2019-09-10 ENCOUNTER — Encounter: Payer: Self-pay | Admitting: Radiation Oncology

## 2019-09-10 ENCOUNTER — Ambulatory Visit: Payer: Medicare Other

## 2019-09-10 VITALS — BP 134/61 | HR 66 | Temp 97.4°F | Resp 13 | Ht 72.0 in | Wt 160.4 lb

## 2019-09-10 DIAGNOSIS — I509 Heart failure, unspecified: Secondary | ICD-10-CM | POA: Diagnosis not present

## 2019-09-10 DIAGNOSIS — F17210 Nicotine dependence, cigarettes, uncomplicated: Secondary | ICD-10-CM | POA: Insufficient documentation

## 2019-09-10 DIAGNOSIS — Z7901 Long term (current) use of anticoagulants: Secondary | ICD-10-CM | POA: Diagnosis not present

## 2019-09-10 DIAGNOSIS — Z79899 Other long term (current) drug therapy: Secondary | ICD-10-CM | POA: Insufficient documentation

## 2019-09-10 DIAGNOSIS — I10 Essential (primary) hypertension: Secondary | ICD-10-CM | POA: Insufficient documentation

## 2019-09-10 DIAGNOSIS — C7931 Secondary malignant neoplasm of brain: Secondary | ICD-10-CM | POA: Insufficient documentation

## 2019-09-10 DIAGNOSIS — J439 Emphysema, unspecified: Secondary | ICD-10-CM | POA: Diagnosis not present

## 2019-09-10 DIAGNOSIS — Z7982 Long term (current) use of aspirin: Secondary | ICD-10-CM | POA: Diagnosis not present

## 2019-09-10 DIAGNOSIS — Z7984 Long term (current) use of oral hypoglycemic drugs: Secondary | ICD-10-CM | POA: Diagnosis not present

## 2019-09-10 DIAGNOSIS — K449 Diaphragmatic hernia without obstruction or gangrene: Secondary | ICD-10-CM | POA: Diagnosis not present

## 2019-09-10 DIAGNOSIS — G4700 Insomnia, unspecified: Secondary | ICD-10-CM | POA: Diagnosis not present

## 2019-09-10 DIAGNOSIS — E785 Hyperlipidemia, unspecified: Secondary | ICD-10-CM | POA: Insufficient documentation

## 2019-09-10 DIAGNOSIS — C3491 Malignant neoplasm of unspecified part of right bronchus or lung: Secondary | ICD-10-CM | POA: Diagnosis not present

## 2019-09-10 DIAGNOSIS — R569 Unspecified convulsions: Secondary | ICD-10-CM | POA: Insufficient documentation

## 2019-09-10 DIAGNOSIS — T380X5A Adverse effect of glucocorticoids and synthetic analogues, initial encounter: Secondary | ICD-10-CM | POA: Insufficient documentation

## 2019-09-10 DIAGNOSIS — I2510 Atherosclerotic heart disease of native coronary artery without angina pectoris: Secondary | ICD-10-CM | POA: Insufficient documentation

## 2019-09-10 DIAGNOSIS — K219 Gastro-esophageal reflux disease without esophagitis: Secondary | ICD-10-CM | POA: Insufficient documentation

## 2019-09-10 DIAGNOSIS — E119 Type 2 diabetes mellitus without complications: Secondary | ICD-10-CM | POA: Diagnosis not present

## 2019-09-10 HISTORY — DX: Malignant neoplasm of unspecified part of right bronchus or lung: C34.91

## 2019-09-10 MED ORDER — DEXAMETHASONE 4 MG PO TABS: 4.0000 mg | ORAL_TABLET | Freq: Two times a day (BID) | ORAL | 0 refills | Status: DC

## 2019-09-10 MED ORDER — ZOLPIDEM TARTRATE 5 MG PO TABS: 5.0000 mg | ORAL_TABLET | Freq: Every evening | ORAL | 0 refills | Status: DC | PRN

## 2019-09-10 NOTE — Progress Notes (Signed)
Radiation Oncology         ([phone]) [phone] ________________________________  Name: Geoffrey Benjamin        MRN: [id number]  Date of Service: 09/10/2019 DOB: Nov 16, 1940  CC:Heywood Bene, PA-C  [place], [place], *     REFERRING PHYSICIAN: Heywood Bene, *   DIAGNOSIS: The primary encounter diagnosis was Brain metastases [facility]). A diagnosis of Adenocarcinoma of right lung, stage 4 (HCC) was also pertinent to this visit.   HISTORY OF PRESENT ILLNESS: Geoffrey Benjamin is a 78 y.o. male originally as an inpatient seen at the request of Dr. Heber [place]augh in internal medicine for a probable Stage IV lung cancer diagnosis. This patient presented was having right sided weakness for a few weeks. He was getting set up in the outpatient setting for an MRI but was brought in on 09/01/2019 via EMS with stroke like symptoms with right sided arm weakness, facial droop and CT imaging did not identify CVA, rather a 16 mm left parietal mass with extensive vasogenic edema. He did have a 50% right ICA origin stenosis in the CTA of the neck. An MRI of the brain revealed concern for a 2.5 cm enhancing lesion in the left frontoparietal region with a 3 mm right parietal cerebral lesion, and three other subcentimeter lesions in the right cerebellum, left cerebellum, and left frontal operculum. The primary lesion had significant vasogenic edema. He was started on dexamethasone at 10 mg, and is currently receiving 6 mg IV BID. Given the findings a CT scan of the CAP  revealed bulky mediastinal adenopathy, supraclavicular adenopathy, and bilateral lung nodules, the largest nodule was in the RLL measuring 1.3 cm, and pleural and parenchymal distortion in the right posterior chest was also seen measuring 3.3 x 1.6 cm. He also had an aortic hematoma distal to the level of the kidneys. He has been seen by vascular surgery and no planned intervention is recommended for the hematoma. He did undergo a biopsy of a supraclavicular  lymph node on the right side on 09/03/2019 which confirmed an adenocarcinoma consistent with a lung primary. He also had a 3T MRI of the brain on 09/02/2019 that revealed 6 metastatic lesions, the largest is in the left parietal lobe measuring 22 mm, and with vasogenic edema and mass effect on the left hemisphere. There were the additional 4 lesions seen on prior study but also a sixth lesion measuring 2-3 mm in the right superior cerebellum. He declined discussing with [place] any further about treatment until he discharged from the hospital. He is seen in clinic today do discuss proceeding with treatment to the brain. Of noted, he did meet with Dr. Julien Nordmann and they are planning further testing of his biopsy specimen to determine systemic therapy and for a PET scan for extent of disease.   PREVIOUS RADIATION THERAPY: No   PAST MEDICAL HISTORY:  Past Medical History:  Diagnosis Date  . Aortic stenosis 09/13/2018  . CAP (community acquired pneumonia) 09/13/2018  . CHF (congestive heart failure) ([place])   . Difficulty walking   . DM (diabetes mellitus) ([place])   . GERD 06/04/2009   Qualifier: Diagnosis of  By: Henrene Pastor MD, Docia Chuck   . Hyperlipidemia 09/13/2018  . Hypertension   . Loss of weight 09/29/2008   Qualifier: Diagnosis of  By: Bobby Rumpf CMA (AAMA), Patty    . Non-small cell carcinoma of right lung, stage 4 ([place])   . PERSONAL HX COLONIC POLYPS 06/04/2009   Qualifier: Diagnosis of  By:  Henrene Pastor MD, [place] PAIN 09/29/2008   Qualifier: Diagnosis of  By: Bobby Rumpf CMA (AAMA), Patty    . Tachycardia   . Tobacco use 09/13/2018  . Volume overload 09/13/2018       PAST SURGICAL HISTORY: Past Surgical History:  Procedure Laterality Date  . CATARACT EXTRACTION, BILATERAL  08/2016  . DOPPLER ECHOCARDIOGRAPHY     Mitral inflow and tissue doppler consistent with impaired LV relaxation; Trileaflet aortic vlave with moderate aortic valve stenosis, Trivial mitral and tricuspid valve regurgitation.      FAMILY HISTORY:  Family History  Problem Relation Age of Onset  . Kidney disease Father   . Stroke Mother      SOCIAL HISTORY:  reports that he has been smoking cigarettes. He has been smoking about 1.50 packs per day. He has never used smokeless tobacco. He reports current alcohol use. He reports that he does not use drugs. The patient is single and lives in [place].     ALLERGIES: Patient has no known allergies.   MEDICATIONS:  Current Outpatient Medications  Medication Sig Dispense Refill  . apixaban (ELIQUIS) 2.5 MG TABS tablet Take 1 tablet (2.5 mg total) by mouth 2 (two) times daily. 60 tablet 2  . aspirin EC 81 MG tablet Take 81 mg by mouth daily.    Marland Kitchen atorvastatin (LIPITOR) 80 MG tablet Take 80 mg by mouth daily.    Marland Kitchen glipiZIDE (GLUCOTROL) 5 MG tablet Take 5 mg by mouth daily before breakfast.    . levETIRAcetam (KEPPRA) 500 MG tablet Take 1 tablet (500 mg total) by mouth 2 (two) times daily. 60 tablet 3  . metFORMIN (GLUCOPHAGE) 1000 MG tablet Take 1 tablet (1,000 mg total) by mouth 2 (two) times daily with a meal. 30 tablet 0  . metoprolol tartrate (LOPRESSOR) 25 MG tablet Take 1 tablet (25 mg total) by mouth 2 (two) times daily. 180 tablet 3  . Omega-3 Fatty Acids (FISH OIL) 1200 MG CAPS Take 1,200 mg by mouth daily.    . pantoprazole (PROTONIX) 40 MG tablet Take 1 tablet (40 mg total) by mouth daily. (Patient taking differently: Take 40 mg by mouth every evening. ) 30 tablet 11  . dexamethasone (DECADRON) 4 MG tablet Take 1 tablet (4 mg total) by mouth 2 (two) times daily. 60 tablet 0  . zolpidem (AMBIEN) 5 MG tablet Take 1 tablet (5 mg total) by mouth at bedtime as needed for sleep. 30 tablet 0   No current facility-administered medications for this encounter.      REVIEW OF SYSTEMS: The patient is a poor historian. It is apparent he is still having some weakness in his right side, but his speech is improved. He does have trouble with sleep and reports that  remeron does not help. He requests something different. No other complaints are verbalized.     PHYSICAL EXAM:  Wt Readings from Last 3 Encounters:  09/10/19 160 lb 6.4 oz (72.8 kg)  09/05/19 161 lb 11.2 oz (73.3 kg)  09/02/19 158 lb 15.2 oz (72.1 kg)   Temp Readings from Last 3 Encounters:  09/10/19 (!) 97.4 F (36.3 C) (Temporal)  09/05/19 98.5 F (36.9 C) (Temporal)  09/04/19 98.3 F (36.8 C) (Axillary)   BP Readings from Last 3 Encounters:  09/10/19 134/61  09/05/19 116/71  09/04/19 (!) 141/68   Pulse Readings from Last 3 Encounters:  09/10/19 66  09/05/19 87  09/04/19 67   Pain Assessment Pain Score: 2  Pain  Frequency: Occasional Pain Loc: Arm/10  In general this is a fatigued and elderly appearing caucasian male in no acute distress. He's alert and oriented x4 and appropriate throughout the examination. Cardiopulmonary assessment is negative for acute distress and he exhibits normal effort.     ECOG = 1  0 - Asymptomatic (Fully active, able to carry on all predisease activities without restriction)  1 - Symptomatic but completely ambulatory (Restricted in physically strenuous activity but ambulatory and able to carry out work of a light or sedentary nature. For example, light housework, office work)  2 - Symptomatic, <50% in bed during the day (Ambulatory and capable of all self care but unable to carry out any work activities. Up and about more than 50% of waking hours)  3 - Symptomatic, >50% in bed, but not bedbound (Capable of only limited self-care, confined to bed or chair 50% or more of waking hours)  4 - Bedbound (Completely disabled. Cannot carry on any self-care. Totally confined to bed or chair)  5 - Death   Eustace Pen MM, Creech RH, Tormey DC, et al. [phone]). "Toxicity and response criteria of the [facility]". [place] Oncol. 5 (6): 649-55    LABORATORY DATA:  Lab Results  Component Value Date   WBC 13.5 (H) 09/04/2019    HGB 11.8 (L) 09/04/2019   HCT 33.9 (L) 09/04/2019   MCV 90.4 09/04/2019   PLT 315 09/04/2019   Lab Results  Component Value Date   NA 137 09/04/2019   K 4.1 09/04/2019   CL 101 09/04/2019   CO2 26 09/04/2019   Lab Results  Component Value Date   ALT 15 09/01/2019   AST 15 09/01/2019   ALKPHOS 51 09/01/2019   BILITOT 0.5 09/01/2019      RADIOGRAPHY: Ct Code Stroke Cta Head W/wo Contrast  Result Date: 09/01/2019 CLINICAL DATA:  Slurred speech and facial droop EXAM: CT ANGIOGRAPHY HEAD AND NECK TECHNIQUE: Multidetector CT imaging of the head and neck was performed using the standard protocol during bolus administration of intravenous contrast. Multiplanar CT image reconstructions and MIPs were obtained to evaluate the vascular anatomy. Carotid stenosis measurements (when applicable) are obtained utilizing NASCET criteria, using the distal internal carotid diameter as the denominator. CONTRAST:  100mL OMNIPAQUE IOHEXOL 350 MG/ML SOLN COMPARISON:  None. FINDINGS: CTA NECK FINDINGS Aortic arch: Extensive atherosclerotic plaque which is irregular. Three vessel branching. Right carotid system: Atheromatous wall thickening of the brachiocephalic to common carotid bifurcation. Atherosclerotic plaque at the bifurcation is moderate and mainly calcified. Right ICA origin stenosis measures 50%. Left carotid system: Atherosclerotic plaque mainly at the common carotid and ICA origins without flow limiting stenosis or ulceration. Vertebral arteries: Extensive proximal subclavian atherosclerosis without flow limiting stenosis. Atherosclerotic plaque on the vertebral arteries on both sides without flow limiting stenosis or dissection. Skeleton: No acute or aggressive finding Other neck: Adenopathy in the bilateral supraclavicular fossae. Upper chest: Partially covered mediastinum shows bilateral adenopathy and multiple right-sided pulmonary nodules. Review of the MIP images confirms the above findings CTA HEAD  FINDINGS Anterior circulation: Diffuse atherosclerotic plaque on the carotid siphons. No branch occlusion or flow limiting stenosis. Negative for aneurysm Posterior circulation: Atherosclerotic plaque on the vertebral arteries. The vertebrobasilar arteries are smooth and widely patent. No PCA branch stenosis or occlusion. Negative for aneurysm Venous sinuses: Diffusely patent Anatomic variants: None significant Delayed phase: Delayed phase shows an avidly enhancing cortically based mass in the left high parietal lobe measuring 16 mm. A second  mass is not seen. Case discussed with Dr. Leonel Ramsay who is already aware of the findings. Review of the MIP images confirms the above findings IMPRESSION: 1. 16 mm left parietal mass with extensive vasogenic edema. The mass is considered secondary to metastatic disease given adenopathy is seen in the mediastinum/bilateral supraclavicular fossae and there are right upper lobe nodules. 2. Atherosclerosis without emergent vascular finding. 3. 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 Electronically Signed   By: Monte Fantasia M.D.   On: 09/01/2019 14:25   Ct Code Stroke Cta Neck W/wo Contrast  Result Date: 09/01/2019 CLINICAL DATA:  Slurred speech and facial droop EXAM: CT ANGIOGRAPHY HEAD AND NECK TECHNIQUE: Multidetector CT imaging of the head and neck was performed using the standard protocol during bolus administration of intravenous contrast. Multiplanar CT image reconstructions and MIPs were obtained to evaluate the vascular anatomy. Carotid stenosis measurements (when applicable) are obtained utilizing NASCET criteria, using the distal internal carotid diameter as the denominator. CONTRAST:  68mL OMNIPAQUE IOHEXOL 350 MG/ML SOLN COMPARISON:  None. FINDINGS: CTA NECK FINDINGS Aortic arch: Extensive atherosclerotic plaque which is irregular. Three vessel branching. Right carotid system: Atheromatous wall thickening of the brachiocephalic to common carotid bifurcation.  Atherosclerotic plaque at the bifurcation is moderate and mainly calcified. Right ICA origin stenosis measures 50%. Left carotid system: Atherosclerotic plaque mainly at the common carotid and ICA origins without flow limiting stenosis or ulceration. Vertebral arteries: Extensive proximal subclavian atherosclerosis without flow limiting stenosis. Atherosclerotic plaque on the vertebral arteries on both sides without flow limiting stenosis or dissection. Skeleton: No acute or aggressive finding Other neck: Adenopathy in the bilateral supraclavicular fossae. Upper chest: Partially covered mediastinum shows bilateral adenopathy and multiple right-sided pulmonary nodules. Review of the MIP images confirms the above findings CTA HEAD FINDINGS Anterior circulation: Diffuse atherosclerotic plaque on the carotid siphons. No branch occlusion or flow limiting stenosis. Negative for aneurysm Posterior circulation: Atherosclerotic plaque on the vertebral arteries. The vertebrobasilar arteries are smooth and widely patent. No PCA branch stenosis or occlusion. Negative for aneurysm Venous sinuses: Diffusely patent Anatomic variants: None significant Delayed phase: Delayed phase shows an avidly enhancing cortically based mass in the left high parietal lobe measuring 16 mm. A second mass is not seen. Case discussed with Dr. Leonel Ramsay who is already aware of the findings. Review of the MIP images confirms the above findings IMPRESSION: 1. 16 mm left parietal mass with extensive vasogenic edema. The mass is considered secondary to metastatic disease given adenopathy is seen in the mediastinum/bilateral supraclavicular fossae and there are right upper lobe nodules. 2. Atherosclerosis without emergent vascular finding. 3. 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 Electronically Signed   By: Monte Fantasia M.D.   On: 09/01/2019 14:25   Ct Chest W Contrast  Result Date: 09/02/2019 CLINICAL DATA:  Staging evaluation for multiple brain  metastases. EXAM: CT CHEST, ABDOMEN, AND PELVIS WITH CONTRAST TECHNIQUE: Multidetector CT imaging of the chest, abdomen and pelvis was performed following the standard protocol during bolus administration of intravenous contrast. CONTRAST:  162mL OMNIPAQUE IOHEXOL 300 MG/ML  SOLN COMPARISON:  Chest, abdomen and pelvis exam of March of 2010. No recent cross-sectional imaging aside from neuro axis imaging is available for comparison FINDINGS: CT CHEST FINDINGS Cardiovascular: Scattered calcific and noncalcific atherosclerotic changes throughout the thoracic aorta of progressed since the previous study. Large areas of noncalcified plaque are demonstrated along the descending thoracic aorta. No signs of aneurysmal dilation. Heart size mildly enlarged with signs of mitral annular and aortic valvular calcifications. Calcified  coronary artery disease. No pericardial effusion. Central pulmonary vasculature is normal. Mediastinum/Nodes: Bulky mediastinal lymphadenopathy. Extensive right paratracheal adenopathy, largest lymph node along the right paratracheal chain measuring 19 mm short axis (image 26, series 3) Small left supraclavicular lymph nodes with rounded morphology largest 1 cm (image 7, series 3) scattered small right supraclavicular lymph nodes as well. (Image 29, series 3) 19 mm left juxta hilar lymph node. Enlargement of subcarinal lymph nodes up to 2 cm in short axis as well. Lungs/Pleura: Signs of paraseptal and centrilobular emphysema. Multiple pulmonary nodules with the distribution compatible with metastatic disease. Right lower lobe pulmonary nodule (image 91, series 4) 1.3 cm. Right middle lobe nodule measuring 7 mm. 4 mm left lower lobe pulmonary nodule and at least 4 additional pulmonary nodules in the left chest. Pleural and parenchymal distortion of posterior right chest measuring approximately 3.3 x 1.6 cm. Six right upper lobe pulmonary nodules greater than 5 mm with at least 2-3 additional small  nodules in the right upper lobe. Musculoskeletal: No signs of chest wall mass. CT ABDOMEN PELVIS FINDINGS Hepatobiliary: No signs of focal hepatic lesion. Sludge, small stones in likely vicarious excretion previously administered contrast in the dependent gallbladder, no signs of pericholecystic stranding. No signs of biliary ductal dilation. Pancreas: Unremarkable. No pancreatic ductal dilatation or surrounding inflammatory changes. Spleen: Normal in size without focal abnormality. Adrenals/Urinary Tract: Normal adrenal glands. Cyst arising from upper pole left kidney. No signs of hydronephrosis. Symmetric renal enhancement. Stomach/Bowel: Small hiatal hernia. No signs of bowel obstruction or acute bowel process. Signs of colonic diverticulosis mainly in descending and sigmoid colon. Vascular/Lymphatic: Extensive calcified and noncalcified plaque in the abdominal aorta. Maximal caliber of the abdominal aorta 3.5 x 3.3 cm. 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 Major visceral branches are patent. No signs of upper abdominal adenopathy. No signs of pelvic lymphadenopathy. Reproductive: Question hyperenhancement of left hemi prostate. This could also be related to calcification within the gland, nonspecific finding overall. Other: No signs of free air or acute intra-abdominal process such as abscess or ascites. Musculoskeletal: Spinal degenerative change. No acute or destructive bone process. IMPRESSION: 1. Bulky mediastinal adenopathy in supraclavicular adenopathy with multiple pulmonary nodules compatible with metastatic disease. 2. Process potentially related to primary lung cancer. 3. 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 This may be a chronic finding but was not present in 2010. Consider correlation with any symptoms of pain in this area and follow-up vascular surgery consultation, more urgent if the patient is experiencing symptoms that would suggest instability of this process, i.e. acute back pain. 4. Question hyperenhancement of the left hemiprostate. This could also be related to calcification within the gland, nonspecific overall finding. Correlation with PSA may be helpful given other findings on today's scan that are compatible with metastatic disease. 5. Emphysema and aortic atherosclerosis. Aortic Atherosclerosis (ICD10-I70.0) and Emphysema (ICD10-J43.9). Electronically Signed   By: Zetta Bills M.D.   On: 09/02/2019 13:52   Mr Jeri Cos GD Contrast  Result Date: 09/02/2019 CLINICAL DATA:  77 year old male stereotactic radio surgery planning study for presumed brain metastases demonstrated on MRI yesterday. EXAM: MRI HEAD WITHOUT AND WITH CONTRAST TECHNIQUE: Multiplanar, multiecho pulse sequences of the brain and surrounding structures were  obtained without and with intravenous contrast. CONTRAST:  15mL GADAVIST GADOBUTROL 1 MMOL/ML IV SOLN COMPARISON:  Brain MRI 09/01/2019. FINDINGS: Considering complete MRI without and with contrast yesterday, DWI and SWI imaging was omitted today. Brain: The five enhancing brain metastases demonstrated yesterday are re-identified and stable, with size ranging from punctate 2 millimeter metastasis of the left posterior frontal operculum (series 6, image 99) to the curvilinear approximately 22 millimeter partially surface based metastasis of the ventral left parietal lobe near perirolandic cortex (series 6, image 121). See also series 6, image 93 (junction of the right posterior temporal and parietal lobes), series 6, image 22 (inferior right cerebellum), and image 40 (mid left cerebellum). There is also a 2-3 millimeter central right superior cerebellar  metastasis on series 6, image 46. But no other abnormal intracranial enhancement is identified. No dural thickening aside from a possible short segment of involvement at the dominant left superior hemisphere lesion. Stable vasogenic edema and mass effect in the left hemisphere. Partially effaced left lateral ventricle. No ventriculomegaly. Minimal rightward midline shift. Normal basilar cisterns. Trace right inferior cerebellar edema (series 4, image 9). Stable gray and white matter signal elsewhere. Vascular: Major intracranial vascular flow voids are stable. Generalized intracranial artery tortuosity. The major dural venous sinuses are enhancing and appear to be patent. Skull and upper cervical spine: Negative visible cervical spine and spinal cord. Visualized bone marrow signal is within normal limits. Sinuses/Orbits: Stable, negative. Other: Mastoids remain clear. Visible internal auditory structures appear normal. IMPRESSION: 1. Study for stereotactic treatment planning. One additional 2-3 mm cerebellar metastasis is identified, bringing the total enhancing brain metastases to Six. All lesions annotated on series 6. 2. Stable dominant lesion in the posterior left hemisphere which is peripherally located with possible focal dural involvement. Stable vasogenic edema and mild intracranial mass effect. Electronically Signed   By: Genevie Ann M.D.   On: 09/02/2019 21:59   Mr Jeri Cos OV Contrast  Result Date: 09/01/2019 CLINICAL DATA:  Stroke follow-up. Right-sided weakness and difficulty with speech. EXAM: MRI HEAD WITHOUT AND WITH CONTRAST TECHNIQUE: Multiplanar, multiecho pulse sequences of the brain and surrounding structures were obtained without and with intravenous contrast. CONTRAST:  56mL GADAVIST GADOBUTROL 1 MMOL/ML IV SOLN COMPARISON:  CT and CTA from earlier today FINDINGS: Brain: Left frontal parietal mass with avid enhancement and extensive vasogenic edema causing local mass effect. The mass has 2  different textures with a superficial more hypointense and hypoenhancing nodule and a more elongated avidly enhancing and fluid signal component extending through the white matter. The total, maximal length of the enhancing portion is 2.5 cm. Additional enhancing lesions: 1. 3 mm along the right parietal cerebral convexity on 10:31 2. Subcentimeter in the inferior right cerebellum on 10:8 3. Subcentimeter in the left cerebellum on 10:15. 4. Subcentimeter along the left frontal operculum on 10:26 and 12:20 This has a metastatic pattern which correlates well with the other findings on CTA of the neck. No acute infarct, acute hemorrhage, hydrocephalus, or extra-axial collection. Vascular: Preserved flow voids and vascular enhancements Skull and upper cervical spine: Negative for marrow lesion Sinuses/Orbits: Bilateral cataract resection. IMPRESSION: Five brain metastases, most notably a 2.5 cm enhancing lesion in the left parietal lobe with extensive vasogenic edema. Electronically Signed   By: Monte Fantasia M.D.   On: 09/01/2019 17:02   Ct Abdomen Pelvis W Contrast  Result Date: 09/02/2019 CLINICAL DATA:  Staging evaluation for multiple brain metastases. EXAM: CT CHEST, ABDOMEN, AND PELVIS WITH CONTRAST TECHNIQUE: Multidetector  CT imaging of the chest, abdomen and pelvis was performed following the standard protocol during bolus administration of intravenous contrast. CONTRAST:  153mL OMNIPAQUE IOHEXOL 300 MG/ML  SOLN COMPARISON:  Chest, abdomen and pelvis exam of March of 2010. No recent cross-sectional imaging aside from neuro axis imaging is available for comparison FINDINGS: CT CHEST FINDINGS Cardiovascular: Scattered calcific and noncalcific atherosclerotic changes throughout the thoracic aorta of progressed since the previous study. Large areas of noncalcified plaque are demonstrated along the descending thoracic aorta. No signs of aneurysmal dilation. Heart size mildly enlarged with signs of mitral  annular and aortic valvular calcifications. Calcified coronary artery disease. No pericardial effusion. Central pulmonary vasculature is normal. Mediastinum/Nodes: Bulky mediastinal lymphadenopathy. Extensive right paratracheal adenopathy, largest lymph node along the right paratracheal chain measuring 19 mm short axis (image 26, series 3) Small left supraclavicular lymph nodes with rounded morphology largest 1 cm (image 7, series 3) scattered small right supraclavicular lymph nodes as well. (Image 29, series 3) 19 mm left juxta hilar lymph node. Enlargement of subcarinal lymph nodes up to 2 cm in short axis as well. Lungs/Pleura: Signs of paraseptal and centrilobular emphysema. Multiple pulmonary nodules with the distribution compatible with metastatic disease. Right lower lobe pulmonary nodule (image 91, series 4) 1.3 cm. Right middle lobe nodule measuring 7 mm. 4 mm left lower lobe pulmonary nodule and at least 4 additional pulmonary nodules in the left chest. Pleural and parenchymal distortion of posterior right chest measuring approximately 3.3 x 1.6 cm. Six right upper lobe pulmonary nodules greater than 5 mm with at least 2-3 additional small nodules in the right upper lobe. Musculoskeletal: No signs of chest wall mass. CT ABDOMEN PELVIS FINDINGS Hepatobiliary: No signs of focal hepatic lesion. Sludge, small stones in likely vicarious excretion previously administered contrast in the dependent gallbladder, no signs of pericholecystic stranding. No signs of biliary ductal dilation. Pancreas: Unremarkable. No pancreatic ductal dilatation or surrounding inflammatory changes. Spleen: Normal in size without focal abnormality. Adrenals/Urinary Tract: Normal adrenal glands. Cyst arising from upper pole left kidney. No signs of hydronephrosis. Symmetric renal enhancement. Stomach/Bowel: Small hiatal hernia. No signs of bowel obstruction or acute bowel process. Signs of colonic diverticulosis mainly in descending and  sigmoid colon. Vascular/Lymphatic: Extensive calcified and noncalcified plaque in the abdominal aorta. Maximal caliber of the abdominal aorta 3.5 x 3.3 cm. 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 Major visceral branches are patent. No signs of upper abdominal adenopathy. No signs of pelvic lymphadenopathy. Reproductive: Question hyperenhancement of left hemi prostate. This could also be related to calcification within the gland, nonspecific finding overall. Other: No signs of free air or acute intra-abdominal process such as abscess or ascites. Musculoskeletal: Spinal degenerative change. No acute or destructive bone process. IMPRESSION: 1. Bulky mediastinal adenopathy in supraclavicular adenopathy with multiple pulmonary nodules compatible with metastatic disease. 2. Process potentially related to primary lung cancer. 3. 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 This may be a chronic finding but was not present in 2010. Consider correlation with any symptoms of pain in this area and follow-up vascular surgery consultation, more urgent if the patient is experiencing symptoms that would suggest instability of this process, i.e. acute back pain. 4. Question hyperenhancement of the left  hemiprostate. This could also be related to calcification within the gland, nonspecific overall finding. Correlation with PSA may be helpful given other findings on today's scan that are compatible with metastatic disease. 5. Emphysema and aortic atherosclerosis. Aortic Atherosclerosis (ICD10-I70.0) and Emphysema (ICD10-J43.9). Electronically Signed   By: Zetta Bills M.D.   On: 09/02/2019 13:52   [place] Core Biopsy (lymph  Nodes)  Result Date: 09/03/2019 INDICATION: 78 year old with evidence for metastatic disease. Patient has brain lesions, extensive chest lymphadenopathy, lung nodules and supraclavicular lymphadenopathy. Tissue diagnosis is needed. EXAM: ULTRASOUND-GUIDED BIOPSY OF RIGHT SUPRACLAVICULAR LYMPH NODE MEDICATIONS: None. ANESTHESIA/SEDATION: None FLUOROSCOPY TIME:  None COMPLICATIONS: None immediate. PROCEDURE: Informed written consent was obtained from the patient after a thorough discussion of the procedural risks, benefits and alternatives. All questions were addressed. A timeout was performed prior to the initiation of the procedure. The supraclavicular areas were evaluated with ultrasound. Multiple small abnormal right supraclavicular lymph nodes were identified. A lymph node lateral to the right internal jugular vein was targeted for ultrasound-guided biopsy. The neck was prepped with chlorhexidine and sterile field was created. Skin and soft tissues were anesthetized with 1% lidocaine. A small skin incision was made. Using ultrasound guidance, an 18 gauge [place] core needle was directed into a lymph node lateral to the right internal jugular vein. Total of 5 core biopsies were obtained and specimens were placed in saline. Bandage placed over the puncture site. FINDINGS: Multiple rounded hypoechoic lymph nodes in the right supraclavicular area. Core biopsies obtained from a suspicious lymph node lateral to the right internal jugular vein. No significant bleeding or hematoma formation following the core biopsies. Adequate core specimens obtained. IMPRESSION: Ultrasound-guided core biopsies from a right supraclavicular lymph node. Electronically Signed   By: Markus Daft M.D.   On: 09/03/2019 16:35   Ct Head Code Stroke Wo Contrast  Result Date: 09/01/2019 CLINICAL DATA:  Code stroke. EXAM: CT HEAD WITHOUT CONTRAST TECHNIQUE: Contiguous axial images were obtained from the base of the skull through the  vertex without intravenous contrast. COMPARISON:  None. FINDINGS: Brain: Extensive edema in the left posterior frontal and parietal hemisphere with gray matter sparing, cytotoxic appearing. A discrete mass is not clearly seen. No hemorrhage, hydrocephalus, or collection. Vascular: Atherosclerotic calcification.  No hyperdense vessel Skull: No acute finding. Periodontal erosions which are partially covered. Sinuses/Orbits: Bilateral cataract resection. Pending direct communication with Neurology team. ASPECTS [facility] Stroke Program Early CT Score) Not scored in this setting IMPRESSION: Extensive edema in the left posterior frontal and parietal lobes with vasogenic appearance suggesting underlying mass or inflammation/infection. Electronically Signed   By: Monte Fantasia M.D.   On: 09/01/2019 14:15       IMPRESSION/PLAN: 1.         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 Dr. Lisbeth Renshaw discusses the pathology findings and reviews the nature of metastatic brain disease and the role for radiotherapy in the treatment. He is also awaiting a PET scan for extent of disease and the role of systemic treatment. He is counseled on stereotactic radiosurgery [facility]) in one fraction and we discussed the need for simulation to plan his treatment. He is scheduled for this today but declines proceeding today. He would like to come back another day to proceed.   We discussed the risks, benefits, short, and long term effects of radiotherapy, and the patient is interested in proceeding. Dr. Lisbeth Renshaw discusses the delivery and logistics of radiotherapy. Our navigator will reach out to him  to coordinate simulation another day which he is aware can shift the day for his treatment. 2.         Steroid Dosing. Despite my notes, he was sent home on 6 mg BID of dexamethasone. I have asked him to discontinue this, and instead, I've called in a prescription for Dexamethasone 4 mg BID, and explained that  we will taper his dosing after treatment. He is on PPI for prophylaxis as well and will need to remain on this until his steroids are discontinued. 3.         Seizure activity. We appreciate the recommendations by neurology team during his inpatient stay. We will follow along expectantly with continued Keppra. 4.         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 The patient will be followed for this but per vascular surgery no intervention is planned. 5. Steroid induced insomnia. Dr. Lisbeth Renshaw recommended Ambien and this was called into his pharmacy. Side effects profile of this drug was also explained to the patient and he will try this tonight.  In a visit lasting 45 minutes, greater than 50% of the time was spent face to face discussing his case, and coordinating the patient's care.  The above documentation reflects my direct findings during this shared patient visit. Please see the separate note by Dr. Lisbeth Renshaw on this date for the remainder of the patient's plan of care.    Carola Rhine, PAC

## 2019-09-10 NOTE — Progress Notes (Signed)
Patient in for consult for metastatic non small cell lung cancer. Patient is upset due to he feels like he is not getting any answers as to why his Right arm and leg are not working correctly. Wife is with him at this time.BP 134/61 (BP Location: Left Arm, Patient Position: Sitting)   Pulse 66   Temp (!) 97.4 F (36.3 C) (Temporal)   Resp 13   Ht 6' (1.829 m)   Wt 160 lb 6.4 oz (72.8 kg)   SpO2 99%   BMI 21.75 kg/m

## 2019-09-10 NOTE — Patient Instructions (Signed)
Coronavirus (COVID-19) Are you at risk?  Are you at risk for the Coronavirus (COVID-19)?  To be considered HIGH RISK for Coronavirus (COVID-19), you have to meet the following criteria:  . Traveled to [place], [place], [place], [place] or [place]; or in the [place] to [place], [place], [place], or [place]; and have fever, cough, and shortness of breath within the last 2 weeks of travel OR . Been in close contact with a person diagnosed with COVID-19 within the last 2 weeks and have fever, cough, and shortness of breath . IF YOU DO NOT MEET THESE CRITERIA, YOU ARE CONSIDERED LOW RISK FOR COVID-19.  What to do if you are HIGH RISK for COVID-19?  . If you are having a medical emergency, call 911. . Seek medical care right away. Before you go to a doctor's office, urgent care or emergency department, call ahead and tell them about your recent travel, contact with someone diagnosed with COVID-19, and your symptoms. You should receive instructions from your physician's office regarding next steps of care.  . When you arrive at healthcare provider, tell the healthcare staff immediately you have returned from visiting [place], [place], [place], [place] or [place]; or traveled in the [place] to [place], [place], [place], or [place]; in the last two weeks or you have been in close contact with a person diagnosed with COVID-19 in the last 2 weeks.   . Tell the health care staff about your symptoms: fever, cough and shortness of breath. . After you have been seen by a medical provider, you will be either: o Tested for (COVID-19) and discharged home on quarantine except to seek medical care if symptoms worsen, and asked to  - Stay home and avoid contact with others until you get your results (4-5 days)  - Avoid travel on public transportation if possible (such as bus, train, or airplane) or o Sent to the Emergency Department by EMS for evaluation, COVID-19 testing, and possible  admission depending on your condition and test results.  What to do if you are LOW RISK for COVID-19?  Reduce your risk of any infection by using the same precautions used for avoiding the common cold or flu:  . Wash your hands often with soap and warm water for at least 20 seconds.  If soap and water are not readily available, use an alcohol-based hand sanitizer with at least 60% alcohol.  . If coughing or sneezing, cover your mouth and nose by coughing or sneezing into the elbow areas of your shirt or coat, into a tissue or into your sleeve (not your hands). . Avoid shaking hands with others and consider head nods or verbal greetings only. . Avoid touching your eyes, nose, or mouth with unwashed hands.  . Avoid close contact with people who are sick. . Avoid places or events with large numbers of people in one location, like concerts or sporting events. . Carefully consider travel plans you have or are making. . If you are planning any travel outside or inside the [place], visit the CDC's Travelers' Health webpage for the latest health notices. . If you have some symptoms but not all symptoms, continue to monitor at home and seek medical attention if your symptoms worsen. . If you are having a medical emergency, call 911.   ADDITIONAL HEALTHCARE OPTIONS FOR PATIENTS  [facility] / e-Visit: [email or web address]         [facility]: [phone]  [facility]: [phone]                   [facility]: [phone]   

## 2019-09-12 ENCOUNTER — Ambulatory Visit
Admission: RE | Admit: 2019-09-12 | Discharge: 2019-09-12 | Disposition: A | Discharge status: Home / Self Care | Payer: Medicare Other | Source: Ambulatory Visit | Attending: Radiation Oncology | Admitting: Radiation Oncology

## 2019-09-12 ENCOUNTER — Other Ambulatory Visit: Payer: Self-pay

## 2019-09-12 ENCOUNTER — Ambulatory Visit: Payer: Medicare Other

## 2019-09-12 DIAGNOSIS — Z510 Encounter for antineoplastic radiation therapy: Secondary | ICD-10-CM | POA: Diagnosis present

## 2019-09-12 DIAGNOSIS — C3491 Malignant neoplasm of unspecified part of right bronchus or lung: Secondary | ICD-10-CM | POA: Insufficient documentation

## 2019-09-12 DIAGNOSIS — C7931 Secondary malignant neoplasm of brain: Secondary | ICD-10-CM | POA: Diagnosis present

## 2019-09-13 ENCOUNTER — Encounter (HOSPITAL_COMMUNITY): Payer: Self-pay | Admitting: Internal Medicine

## 2019-09-16 ENCOUNTER — Encounter: Payer: Self-pay | Admitting: Radiation Therapy

## 2019-09-16 ENCOUNTER — Other Ambulatory Visit: Payer: Self-pay | Admitting: Radiation Therapy

## 2019-09-16 DIAGNOSIS — C7949 Secondary malignant neoplasm of other parts of nervous system: Secondary | ICD-10-CM

## 2019-09-16 DIAGNOSIS — C7931 Secondary malignant neoplasm of brain: Secondary | ICD-10-CM

## 2019-09-16 NOTE — Progress Notes (Signed)
Mr. Zalesky did not have labs done on Sat 12/12 as planned. He has asked that they be rescheduled to Tuesday 12/15. He has been instructed to not resume the metformin until after we have called back with these results.   Mont Dutton R.T.(R)(T) Radiation Special Procedures Navigator

## 2019-09-17 ENCOUNTER — Other Ambulatory Visit: Payer: Self-pay

## 2019-09-17 ENCOUNTER — Ambulatory Visit: Payer: Medicare Other | Admitting: Radiation Oncology

## 2019-09-17 ENCOUNTER — Ambulatory Visit
Admission: RE | Admit: 2019-09-17 | Discharge: 2019-09-17 | Disposition: A | Discharge status: Home / Self Care | Payer: Medicare Other | Source: Ambulatory Visit | Attending: Radiation Oncology | Admitting: Radiation Oncology

## 2019-09-17 ENCOUNTER — Telehealth: Payer: Self-pay | Admitting: Radiation Therapy

## 2019-09-17 DIAGNOSIS — Z510 Encounter for antineoplastic radiation therapy: Secondary | ICD-10-CM | POA: Diagnosis not present

## 2019-09-17 DIAGNOSIS — C7931 Secondary malignant neoplasm of brain: Secondary | ICD-10-CM

## 2019-09-17 DIAGNOSIS — C7949 Secondary malignant neoplasm of other parts of nervous system: Secondary | ICD-10-CM

## 2019-09-17 LAB — BUN & CREATININE (CHCC)
BUN: 16 mg/dL (ref 8–23)
Creatinine: 0.89 mg/dL (ref 0.61–1.24)
GFR, Est AFR Am: >60 mL/min (ref >60)
GFR, Estimated: >60 mL/min (ref >60)

## 2019-09-17 NOTE — Telephone Encounter (Signed)
Left a detailed message on Geoffrey Benjamin home line letting him know that the labs from today are within normal limits and that he should resume taking his metformin as prescribed. I included a reminder about his upcoming [place] treatment on 12/17 and my contact information with instruction to call me if they have any questions.   Mont Dutton R.T.(R)(T) Radiation Special Procedures Navigator

## 2019-09-19 ENCOUNTER — Encounter: Payer: Self-pay | Admitting: Radiation Oncology

## 2019-09-19 ENCOUNTER — Encounter (HOSPITAL_COMMUNITY): Payer: Self-pay | Admitting: Internal Medicine

## 2019-09-19 ENCOUNTER — Other Ambulatory Visit: Payer: Self-pay

## 2019-09-19 ENCOUNTER — Encounter: Payer: Self-pay | Admitting: Radiation Therapy

## 2019-09-19 ENCOUNTER — Ambulatory Visit
Admission: RE | Admit: 2019-09-19 | Discharge: 2019-09-19 | Disposition: A | Discharge status: Home / Self Care | Payer: Medicare Other | Source: Ambulatory Visit | Attending: Radiation Oncology | Admitting: Radiation Oncology

## 2019-09-19 VITALS — BP 143/53 | HR 59 | Temp 97.9°F | Resp 18

## 2019-09-19 DIAGNOSIS — C7931 Secondary malignant neoplasm of brain: Secondary | ICD-10-CM

## 2019-09-19 DIAGNOSIS — Z510 Encounter for antineoplastic radiation therapy: Secondary | ICD-10-CM | POA: Diagnosis not present

## 2019-09-19 NOTE — Patient Instructions (Signed)
  Radiation Oncology         [phone] ________________________________  STEROID TAPER INSTRUCTIONS  Name: Geoffrey Benjamin MRN: [id number]  Date of Service: 09/19/2019  DOB: 1941/02/15  You are currently taking a medication called Dexamethasone (Decadron is brand name) which is a steroid to help reduce the inflammation from your brain tumor. This medication should not be stopped abruptly. Below is a scheduled taper instruction sheet for you to slowly decrease your dose of the medication with the goal of discontinuing this soon. The use of steroids can increase risks of feeling jittery, causing insomnia, resulting in increases in blood sugar, agitation, and indigestion. Long term risks can cause lower extremity swelling, extremity weakness, bone thinning and risks of gastrointestinal ulcers or perforation among others. We also ask that you continue to take any prescribed medications for reflux. If you are not taking these, please start taking a over the counter prilosec once daily.   You currently have a prescription for Dexamethasone 4 mg tablets. A taper involves cutting your tablets in half.  Beginning 09/23/2019: Start taking 1/2 of a tablet by mouth twice a day.  Beginning 09/22/2019: Start taking 1/2 of a tablet by mouth once daily.  Beginning 10/07/2019: Start taking 1/2 tablet by mouth every other day, and stop taking this on 10/16/2019.  If during any of these weeks you start to experience symptoms of headache, progressive weakness, changes in speech, movement, or vision, please call our office as your taper may need to be adjusted.

## 2019-09-19 NOTE — Progress Notes (Addendum)
Steroid taper instructions given to patient after his [place] treatment.   You currently have a prescription for Dexamethasone 4 mg tablets. A taper involves cutting your tablets in half. Beginning 09/23/2019: Start taking 1/2 of a tablet by mouth twice a day. Beginning 09/30/2019: Start taking 1/2 of a tablet by mouth once daily. Beginning 10/07/2019: Start taking 1/2 tablet by mouth every other day, and stop taking this on 10/16/2019. If during any of these weeks you start to experience symptoms of headache, progressive weakness, changes in speech, movement, or vision, please call our office as your taper may need to be adjusted.

## 2019-09-19 NOTE — Progress Notes (Signed)
  Radiation Oncology         [phone] ________________________________  Name: Geoffrey Benjamin MRN: [id number]  Date: 09/12/2019  DOB: 1940/12/04  DIAGNOSIS:     ICD-10-CM   1. Brain metastases ([place])  C79.31     NARRATIVE:  The patient was brought to the [facility].  Identity was confirmed.  All relevant records and images related to the planned course of therapy were reviewed.  The patient freely provided informed written consent to proceed with treatment after reviewing the details related to the planned course of therapy. The consent form was witnessed and verified by the simulation staff. Intravenous access was established for contrast administration. Then, the patient was set-up in a stable reproducible supine position for radiation therapy.  A relocatable thermoplastic stereotactic head frame was fabricated for precise immobilization.  CT images were obtained.  Surface markings were placed.  The CT images were loaded into the planning software and fused with the patient's targeting MRI scan.  Then the target and avoidance structures were contoured.  Treatment planning then occurred.  The radiation prescription was entered and confirmed.  I have requested 3D planning  I have requested a DVH of the following structures: Brain stem, brain, left eye, right eye, lenses, optic chiasm, target volumes, uninvolved brain, and normal tissue.    SPECIAL TREATMENT PROCEDURE:  The planned course of therapy using radiation constitutes a special treatment procedure. Special care is required in the management of this patient for the following reasons. This treatment constitutes a Special Treatment Procedure for the following reason: High dose per fraction requiring special monitoring for increased toxicities of treatment including daily imaging.  The special nature of the planned course of radiotherapy will require increased physician supervision and oversight to ensure patient's safety with  optimal treatment outcomes.  PLAN:  The patient will receive 20 Gy in 1 fraction to PTVs 1-5, and 18 Gy in 1 fraction to PTV6.   ------------------------------------------------  Jodelle Gross, MD, PhD

## 2019-09-19 NOTE — Progress Notes (Signed)
  Radiation Oncology         [phone] ________________________________  Name: Geoffrey Benjamin MRN: [id number]  Date: 09/19/2019  DOB: [birth year]   SPECIAL TREATMENT PROCEDURE   3D TREATMENT PLANNING AND DOSIMETRY: The patient's radiation plan was reviewed and approved by Dr. Saintclair Halsted from neurosurgery and radiation oncology prior to treatment. It showed 3-dimensional radiation distributions overlaid onto the planning CT/MRI image set. The [facility] for the target structures as well as the organs at risk were reviewed. The documentation of the 3D plan and dosimetry are filed in the radiation oncology EMR.   NARRATIVE: The patient was brought to the TrueBeam stereotactic radiation treatment machine and placed supine on the CT couch. The head frame was applied, and the patient was set up for stereotactic radiosurgery. Neurosurgery was present for the set-up and delivery   SIMULATION VERIFICATION: In the couch zero-angle position, the patient underwent Exactrac imaging using the Brainlab system with orthogonal KV images. These were carefully aligned and repeated to confirm treatment position for each of the isocenters. The Exactrac snap film verification was repeated at each couch angle.   SPECIAL TREATMENT PROCEDURE: The patient received stereotactic radiosurgery to the following targets:  1.  PTVs 1-5 target was treated using 6 Arcs to a prescription dose of 20 Gy. ExacTrac Snap verification was performed for each couch angle.  2.  Concurrently, PTV6 target was treated using 6 Arcs to a prescription dose of 18 Gy. ExacTrac Snap verification was performed for each couch angle.  STEREOTACTIC TREATMENT MANAGEMENT: Following delivery, the patient was transported to nursing in stable condition and monitored for possible acute effects. Vital signs were recorded . The patient tolerated treatment without significant acute effects, and was discharged to home in stable condition.  PLAN: Follow-up in one  month.   ------------------------------------------------  Jodelle Gross, MD, PhD

## 2019-09-19 NOTE — Progress Notes (Signed)
Received patient in the clinic following 1 of 1 intended SRS treatments for brain metastasis. Vitals stable. Patient denies pain. Patient states, "lady I feel great." Encouraged the patient to remain thirty minutes for nurse monitoring. Patient refused to stay despite encouragement. Patient denies new onset neurologic symptoms. States several times, "I feel great." Patient agreed this RN could escort him to lobby. Slow steady gait noted. Delivered patient to wife. Reviewed AVS with wife to include decadron taper. Answered all questions to the best of my ability. Instructed patient and wife that he should avoid strenuous activity for the next 24 hours. Instructed both to phone [phone] with needs or questions related to today's treatment. Both verbalized understanding of all reviewed. Dr. Lisbeth Renshaw and Mont Dutton informed the patient refused to stay for monitoring. Patient discharged home with wife in no distress.   BP (!) 143/53   Pulse (!) 59   Temp 97.9 F (36.6 C)   Resp 18   SpO2 100%

## 2019-09-20 ENCOUNTER — Telehealth: Payer: Self-pay | Admitting: Physician Assistant

## 2019-09-20 NOTE — Procedures (Signed)
  Name: Geoffrey Benjamin  MRN: [id number]  Date: 09/19/2019   DOB: [birth year]  Stereotactic Radiosurgery Operative Note  PRE-OPERATIVE DIAGNOSIS:  Multiple Brain Metastases  POST-OPERATIVE DIAGNOSIS:  Multiple Brain Metastases  PROCEDURE:  Stereotactic Radiosurgery  SURGEON:  Janera Peugh P, MD  NARRATIVE: The patient underwent a radiation treatment planning session in the radiation oncology simulation suite under the care of the radiation oncology physician and physicist.  I participated closely in the radiation treatment planning afterwards. The patient underwent planning CT which was fused to 3T high resolution MRI with 1 mm axial slices.  These images were fused on the planning system.  We contoured the gross target volumes and subsequently expanded this to yield the Planning Target Volume. I actively participated in the planning process.  I helped to define and review the target contours and also the contours of the optic pathway, eyes, brainstem and selected nearby organs at risk.  All the dose constraints for critical structures were reviewed and compared to AAPM Task Group 101.  The prescription dose conformity was reviewed.  I approved the plan electronically.    Accordingly, Latricia Heft Grimsley was brought to the TrueBeam stereotactic radiation treatment linac and placed in the custom immobilization mask.  The patient was aligned according to the IR fiducial markers with BrainLab Exactrac, then orthogonal x-rays were used in ExacTrac with the 6DOF robotic table and the shifts were made to align the patient  Heath Gold received stereotactic radiosurgery uneventfully.    Lesions treated:  6   Complex lesions treated:  0 (>3.5 cm, <67mm of optic path, or within the brainstem)   The detailed description of the procedure is recorded in the radiation oncology procedure note.  I was present for the duration of the procedure.  DISPOSITION:  Following delivery, the patient was transported to nursing in  stable condition and monitored for possible acute effects to be discharged to home in stable condition with follow-up in one month.  Ayaz Sondgeroth P, MD 09/20/2019 11:03 AM

## 2019-09-20 NOTE — Telephone Encounter (Signed)
I spoke to the patient and his wife about his PET scan still needing to be scheduled. I gave them the number to the radiology department and asked that they schedule the scan at their earliest convenience.

## 2019-09-22 NOTE — Progress Notes (Deleted)
[facility] OFFICE PROGRESS NOTE  Heywood Bene, PA-C [address] [place] [address]  DIAGNOSIS: stage IV, T1b, N3, M1 C) non-small cell lung cancer, adenocarcinoma diagnosed in November 2020 and presented with multiple pulmonary nodules mainly in the right lung as well as the left lung in addition to mediastinal and supraclavicular lymphadenopathy and multiple metastatic brain lesions.   PDL1:   [employer]  PRIOR THERAPY: SRS to the metastatic brain lesions on __ under the care of Dr. Lisbeth Renshaw   CURRENT THERAPY:   INTERVAL HISTORY: Geoffrey Benjamin 78 y.o. male returns to the clinic for a follow-up visit.  The patient was recently diagnosed with stage IV adenocarcinoma.  He recently completed SRS treatment to the metastatic brain lesions under the care of Dr. Lisbeth Renshaw.  He is currently taking a tapering dose of Decadron.  Today he is feeling__without any concerning complaints except for __.  He denies any fever, chills, night sweats, or weight loss.  He denies any chest pain, shortness of breath, cough, or hemoptysis.  He denies any nausea, vomiting, diarrhea, or constipation.  He denies any headache or visual changes.  He was supposed to have his initial staging PET scan performed but this is scheduled for next week on 10-01-2019.  He had guardant 360 and foundation 1 testing performed to assess for any actionable mutations.  He is here today for evaluation and to discuss his current condition and treatment options.   MEDICAL HISTORY: Past Medical History:  Diagnosis Date  . Aortic stenosis 09/13/2018  . CAP (community acquired pneumonia) 09/13/2018  . CHF (congestive heart failure) ([place])   . Difficulty walking   . DM (diabetes mellitus) ([place])   . GERD 06/04/2009   Qualifier: Diagnosis of  By: Henrene Pastor MD, Docia Chuck   . Hyperlipidemia 09/13/2018  . Hypertension   . Loss of weight 09/29/2008   Qualifier: Diagnosis of  By: Bobby Rumpf CMA (AAMA), Patty    .  Non-small cell carcinoma of right lung, stage 4 ([place])   . PERSONAL HX COLONIC POLYPS 06/04/2009   Qualifier: Diagnosis of  By: Henrene Pastor MD, [place] PAIN 09/29/2008   Qualifier: Diagnosis of  By: Bobby Rumpf CMA (AAMA), Patty    . Tachycardia   . Tobacco use 09/13/2018  . Volume overload 09/13/2018    ALLERGIES:  has No Known Allergies.  MEDICATIONS:  Current Outpatient Medications  Medication Sig Dispense Refill  . apixaban (ELIQUIS) 2.5 MG TABS tablet Take 1 tablet (2.5 mg total) by mouth 2 (two) times daily. 60 tablet 2  . aspirin EC 81 MG tablet Take 81 mg by mouth daily.    Marland Kitchen atorvastatin (LIPITOR) 80 MG tablet Take 80 mg by mouth daily.    Marland Kitchen dexamethasone (DECADRON) 4 MG tablet Take 1 tablet (4 mg total) by mouth 2 (two) times daily. 60 tablet 0  . glipiZIDE (GLUCOTROL) 5 MG tablet Take 5 mg by mouth daily before breakfast.    . levETIRAcetam (KEPPRA) 500 MG tablet Take 1 tablet (500 mg total) by mouth 2 (two) times daily. 60 tablet 3  . metFORMIN (GLUCOPHAGE) 1000 MG tablet Take 1 tablet (1,000 mg total) by mouth 2 (two) times daily with a meal. 30 tablet 0  . metoprolol tartrate (LOPRESSOR) 25 MG tablet Take 1 tablet (25 mg total) by mouth 2 (two) times daily. 180 tablet 3  . Omega-3 Fatty Acids (FISH OIL) 1200 MG CAPS Take 1,200 mg by mouth daily.    Marland Kitchen  pantoprazole (PROTONIX) 40 MG tablet Take 1 tablet (40 mg total) by mouth daily. (Patient taking differently: Take 40 mg by mouth every evening. ) 30 tablet 11  . zolpidem (AMBIEN) 5 MG tablet Take 1 tablet (5 mg total) by mouth at bedtime as needed for sleep. 30 tablet 0   No current facility-administered medications for this visit.    SURGICAL HISTORY:  Past Surgical History:  Procedure Laterality Date  . CATARACT EXTRACTION, BILATERAL  08/2016  . DOPPLER ECHOCARDIOGRAPHY     Mitral inflow and tissue doppler consistent with impaired LV relaxation; Trileaflet aortic vlave with moderate aortic valve stenosis, Trivial mitral and  tricuspid valve regurgitation.    REVIEW OF SYSTEMS:   Review of Systems  Constitutional: Negative for appetite change, chills, fatigue, fever and unexpected weight change.  HENT:   Negative for mouth sores, nosebleeds, sore throat and trouble swallowing.   Eyes: Negative for eye problems and icterus.  Respiratory: Negative for cough, hemoptysis, shortness of breath and wheezing.   Cardiovascular: Negative for chest pain and leg swelling.  Gastrointestinal: Negative for abdominal pain, constipation, diarrhea, nausea and vomiting.  Genitourinary: Negative for bladder incontinence, difficulty urinating, dysuria, frequency and hematuria.   Musculoskeletal: Negative for back pain, gait problem, neck pain and neck stiffness.  Skin: Negative for itching and rash.  Neurological: Negative for dizziness, extremity weakness, gait problem, headaches, light-headedness and seizures.  Hematological: Negative for adenopathy. Does not bruise/bleed easily.  Psychiatric/Behavioral: Negative for confusion, depression and sleep disturbance. The patient is not nervous/anxious.     PHYSICAL EXAMINATION:  There were no vitals taken for this visit.  ECOG PERFORMANCE STATUS: {CHL ONC ECOG [id number]  Physical Exam  Constitutional: Oriented to person, place, and time and well-developed, well-nourished, and in no distress. No distress.  HENT:  Head: Normocephalic and atraumatic.  Mouth/Throat: Oropharynx is clear and moist. No oropharyngeal exudate.  Eyes: Conjunctivae are normal. Right eye exhibits no discharge. Left eye exhibits no discharge. No scleral icterus.  Neck: Normal range of motion. Neck supple.  Cardiovascular: Normal rate, regular rhythm, normal heart sounds and intact distal pulses.   Pulmonary/Chest: Effort normal and breath sounds normal. No respiratory distress. No wheezes. No rales.  Abdominal: Soft. Bowel sounds are normal. Exhibits no distension and no mass. There is no tenderness.   Musculoskeletal: Normal range of motion. Exhibits no edema.  Lymphadenopathy:    No cervical adenopathy.  Neurological: Alert and oriented to person, place, and time. Exhibits normal muscle tone. Gait normal. Coordination normal.  Skin: Skin is warm and dry. No rash noted. Not diaphoretic. No erythema. No pallor.  Psychiatric: Mood, memory and judgment normal.  Vitals reviewed.  LABORATORY DATA: Lab Results  Component Value Date   WBC 13.5 (H) 09/04/2019   HGB 11.8 (L) 09/04/2019   HCT 33.9 (L) 09/04/2019   MCV 90.4 09/04/2019   PLT 315 09/04/2019      Chemistry      Component Value Date/Time   NA 137 09/04/2019 0207   K 4.1 09/04/2019 0207   CL 101 09/04/2019 0207   CO2 26 09/04/2019 0207   BUN 16 09/17/2019 1156   CREATININE 0.89 09/17/2019 1156      Component Value Date/Time   CALCIUM 9.0 09/04/2019 0207   ALKPHOS 51 09/01/2019 1348   AST 15 09/01/2019 1348   ALT 15 09/01/2019 1348   BILITOT 0.5 09/01/2019 1348       RADIOGRAPHIC STUDIES:  CT Code Stroke CTA Head W/WO contrast  Result Date:  09/01/2019 CLINICAL DATA:  Slurred speech and facial droop EXAM: CT ANGIOGRAPHY HEAD AND NECK TECHNIQUE: Multidetector CT imaging of the head and neck was performed using the standard protocol during bolus administration of intravenous contrast. Multiplanar CT image reconstructions and MIPs were obtained to evaluate the vascular anatomy. Carotid stenosis measurements (when applicable) are obtained utilizing NASCET criteria, using the distal internal carotid diameter as the denominator. CONTRAST:  24m OMNIPAQUE IOHEXOL 350 MG/ML SOLN COMPARISON:  None. FINDINGS: CTA NECK FINDINGS Aortic arch: Extensive atherosclerotic plaque which is irregular. Three vessel branching. Right carotid system: Atheromatous wall thickening of the brachiocephalic to common carotid bifurcation. Atherosclerotic plaque at the bifurcation is moderate and mainly calcified. Right ICA origin stenosis measures  50%. Left carotid system: Atherosclerotic plaque mainly at the common carotid and ICA origins without flow limiting stenosis or ulceration. Vertebral arteries: Extensive proximal subclavian atherosclerosis without flow limiting stenosis. Atherosclerotic plaque on the vertebral arteries on both sides without flow limiting stenosis or dissection. Skeleton: No acute or aggressive finding Other neck: Adenopathy in the bilateral supraclavicular fossae. Upper chest: Partially covered mediastinum shows bilateral adenopathy and multiple right-sided pulmonary nodules. Review of the MIP images confirms the above findings CTA HEAD FINDINGS Anterior circulation: Diffuse atherosclerotic plaque on the carotid siphons. No branch occlusion or flow limiting stenosis. Negative for aneurysm Posterior circulation: Atherosclerotic plaque on the vertebral arteries. The vertebrobasilar arteries are smooth and widely patent. No PCA branch stenosis or occlusion. Negative for aneurysm Venous sinuses: Diffusely patent Anatomic variants: None significant Delayed phase: Delayed phase shows an avidly enhancing cortically based mass in the left high parietal lobe measuring 16 mm. A second mass is not seen. Case discussed with Dr. KLeonel Ramsaywho is already aware of the findings. Review of the MIP images confirms the above findings IMPRESSION: 1. 16 mm left parietal mass with extensive vasogenic edema. The mass is considered secondary to metastatic disease given adenopathy is seen in the mediastinum/bilateral supraclavicular fossae and there are right upper lobe nodules. 2. Atherosclerosis without emergent vascular finding. 3. 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 Electronically Signed   By: JMonte FantasiaM.D.   On: 09/01/2019 14:25   CT Code Stroke CTA Neck W/WO contrast  Result Date: 09/01/2019 CLINICAL DATA:  Slurred speech and facial droop EXAM: CT ANGIOGRAPHY HEAD AND NECK TECHNIQUE: Multidetector CT imaging of the head and neck was performed  using the standard protocol during bolus administration of intravenous contrast. Multiplanar CT image reconstructions and MIPs were obtained to evaluate the vascular anatomy. Carotid stenosis measurements (when applicable) are obtained utilizing NASCET criteria, using the distal internal carotid diameter as the denominator. CONTRAST:  710mOMNIPAQUE IOHEXOL 350 MG/ML SOLN COMPARISON:  None. FINDINGS: CTA NECK FINDINGS Aortic arch: Extensive atherosclerotic plaque which is irregular. Three vessel branching. Right carotid system: Atheromatous wall thickening of the brachiocephalic to common carotid bifurcation. Atherosclerotic plaque at the bifurcation is moderate and mainly calcified. Right ICA origin stenosis measures 50%. Left carotid system: Atherosclerotic plaque mainly at the common carotid and ICA origins without flow limiting stenosis or ulceration. Vertebral arteries: Extensive proximal subclavian atherosclerosis without flow limiting stenosis. Atherosclerotic plaque on the vertebral arteries on both sides without flow limiting stenosis or dissection. Skeleton: No acute or aggressive finding Other neck: Adenopathy in the bilateral supraclavicular fossae. Upper chest: Partially covered mediastinum shows bilateral adenopathy and multiple right-sided pulmonary nodules. Review of the MIP images confirms the above findings CTA HEAD FINDINGS Anterior circulation: Diffuse atherosclerotic plaque on the carotid siphons. No branch occlusion or flow  limiting stenosis. Negative for aneurysm Posterior circulation: Atherosclerotic plaque on the vertebral arteries. The vertebrobasilar arteries are smooth and widely patent. No PCA branch stenosis or occlusion. Negative for aneurysm Venous sinuses: Diffusely patent Anatomic variants: None significant Delayed phase: Delayed phase shows an avidly enhancing cortically based mass in the left high parietal lobe measuring 16 mm. A second mass is not seen. Case discussed with Dr.  Leonel Ramsay who is already aware of the findings. Review of the MIP images confirms the above findings IMPRESSION: 1. 16 mm left parietal mass with extensive vasogenic edema. The mass is considered secondary to metastatic disease given adenopathy is seen in the mediastinum/bilateral supraclavicular fossae and there are right upper lobe nodules. 2. Atherosclerosis without emergent vascular finding. 3. 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 Electronically Signed   By: Monte Fantasia M.D.   On: 09/01/2019 14:25   CT CHEST W CONTRAST  Result Date: 09/02/2019 CLINICAL DATA:  Staging evaluation for multiple brain metastases. EXAM: CT CHEST, ABDOMEN, AND PELVIS WITH CONTRAST TECHNIQUE: Multidetector CT imaging of the chest, abdomen and pelvis was performed following the standard protocol during bolus administration of intravenous contrast. CONTRAST:  146m OMNIPAQUE IOHEXOL 300 MG/ML  SOLN COMPARISON:  Chest, abdomen and pelvis exam of March of 2010. No recent cross-sectional imaging aside from neuro axis imaging is available for comparison FINDINGS: CT CHEST FINDINGS Cardiovascular: Scattered calcific and noncalcific atherosclerotic changes throughout the thoracic aorta of progressed since the previous study. Large areas of noncalcified plaque are demonstrated along the descending thoracic aorta. No signs of aneurysmal dilation. Heart size mildly enlarged with signs of mitral annular and aortic valvular calcifications. Calcified coronary artery disease. No pericardial effusion. Central pulmonary vasculature is normal. Mediastinum/Nodes: Bulky mediastinal lymphadenopathy. Extensive right paratracheal adenopathy, largest lymph node along the right paratracheal chain measuring 19 mm short axis (image 26, series 3) Small left supraclavicular lymph nodes with rounded morphology largest 1 cm (image 7, series 3) scattered small right supraclavicular lymph nodes as well. (Image 29, series 3) 19 mm left juxta hilar lymph node.  Enlargement of subcarinal lymph nodes up to 2 cm in short axis as well. Lungs/Pleura: Signs of paraseptal and centrilobular emphysema. Multiple pulmonary nodules with the distribution compatible with metastatic disease. Right lower lobe pulmonary nodule (image 91, series 4) 1.3 cm. Right middle lobe nodule measuring 7 mm. 4 mm left lower lobe pulmonary nodule and at least 4 additional pulmonary nodules in the left chest. Pleural and parenchymal distortion of posterior right chest measuring approximately 3.3 x 1.6 cm. Six right upper lobe pulmonary nodules greater than 5 mm with at least 2-3 additional small nodules in the right upper lobe. Musculoskeletal: No signs of chest wall mass. CT ABDOMEN PELVIS FINDINGS Hepatobiliary: No signs of focal hepatic lesion. Sludge, small stones in likely vicarious excretion previously administered contrast in the dependent gallbladder, no signs of pericholecystic stranding. No signs of biliary ductal dilation. Pancreas: Unremarkable. No pancreatic ductal dilatation or surrounding inflammatory changes. Spleen: Normal in size without focal abnormality. Adrenals/Urinary Tract: Normal adrenal glands. Cyst arising from upper pole left kidney. No signs of hydronephrosis. Symmetric renal enhancement. Stomach/Bowel: Small hiatal hernia. No signs of bowel obstruction or acute bowel process. Signs of colonic diverticulosis mainly in descending and sigmoid colon. Vascular/Lymphatic: Extensive calcified and noncalcified plaque in the abdominal aorta. Maximal caliber of the abdominal aorta 3.5 x 3.3 cm. 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 Major visceral branches are patent. No signs of upper abdominal adenopathy. No signs of pelvic lymphadenopathy. Reproductive: Question hyperenhancement  of left hemi prostate. This could also be related to calcification within the gland, nonspecific finding overall. Other: No signs of free air or acute intra-abdominal process such as abscess or ascites. Musculoskeletal: Spinal degenerative change. No acute or destructive bone process. IMPRESSION: 1. Bulky mediastinal adenopathy in supraclavicular adenopathy with multiple pulmonary nodules compatible with metastatic disease. 2. Process potentially related to primary lung cancer. 3. 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 This may be a chronic finding but was not present in 2010. Consider correlation with any symptoms of pain in this area and follow-up vascular surgery consultation, more urgent if the patient is experiencing symptoms that would suggest instability of this process, i.e. acute back pain. 4. Question hyperenhancement of the left hemiprostate. This could also be related to calcification within the gland, nonspecific overall finding. Correlation with PSA may be helpful given other findings on today's scan that are compatible with metastatic disease. 5. Emphysema and aortic atherosclerosis. Aortic Atherosclerosis (ICD10-I70.0) and Emphysema (ICD10-J43.9). Electronically Signed   By: Zetta Bills M.D.   On: 09/02/2019 13:52   MR BRAIN W WO CONTRAST  Result Date: 09/02/2019 CLINICAL DATA:  78 year old male stereotactic radio surgery planning study for presumed brain metastases demonstrated on MRI yesterday. EXAM: MRI HEAD WITHOUT AND WITH CONTRAST TECHNIQUE: Multiplanar, multiecho pulse sequences of the brain and surrounding structures were obtained without and with intravenous contrast. CONTRAST:  74m GADAVIST GADOBUTROL 1 MMOL/ML IV SOLN COMPARISON:  Brain MRI 09/01/2019. FINDINGS: Considering complete MRI without and with contrast yesterday, DWI and SWI imaging was omitted today. Brain:  The five enhancing brain metastases demonstrated yesterday are re-identified and stable, with size ranging from punctate 2 millimeter metastasis of the left posterior frontal operculum (series 6, image 99) to the curvilinear approximately 22 millimeter partially surface based metastasis of the ventral left parietal lobe near perirolandic cortex (series 6, image 121). See also series 6, image 93 (junction of the right posterior temporal and parietal lobes), series 6, image 22 (inferior right cerebellum), and image 40 (mid left cerebellum). There is also a 2-3 millimeter central right superior cerebellar metastasis on series 6, image 46. But no other abnormal intracranial enhancement is identified. No dural thickening aside from a possible short segment of involvement at the dominant left superior hemisphere lesion. Stable vasogenic edema and mass effect in the left hemisphere. Partially effaced left lateral ventricle. No ventriculomegaly. Minimal rightward midline shift. Normal basilar cisterns. Trace right inferior cerebellar edema (series 4, image 9). Stable gray and white matter signal elsewhere. Vascular: Major intracranial vascular flow voids are stable. Generalized intracranial artery tortuosity. The major dural venous sinuses are enhancing and appear to be patent. Skull and upper cervical spine: Negative visible cervical spine and spinal cord. Visualized bone marrow signal is within normal limits. Sinuses/Orbits: Stable, negative. Other: Mastoids remain clear. Visible internal auditory structures appear normal. IMPRESSION: 1. Study for stereotactic treatment planning. One additional 2-3 mm cerebellar metastasis is identified, bringing the total enhancing brain metastases to Six. All lesions annotated on series 6. 2. Stable dominant lesion in the posterior left hemisphere which is peripherally located with possible focal dural involvement. Stable vasogenic edema and mild intracranial mass effect. Electronically  Signed   By: HLemmie Evens  [place] Crane M.D.   On: 09/02/2019 21:59   MR BRAIN W WO CONTRAST  Result Date: 09/01/2019 CLINICAL DATA:  Stroke follow-up. Right-sided weakness and difficulty with speech. EXAM: MRI HEAD WITHOUT AND WITH CONTRAST TECHNIQUE: Multiplanar, multiecho pulse sequences of the brain and surrounding structures were obtained without and with intravenous contrast. CONTRAST:  66m GADAVIST GADOBUTROL 1 MMOL/ML IV SOLN COMPARISON:  CT and CTA from earlier today FINDINGS: Brain: Left frontal parietal mass with avid enhancement and extensive vasogenic edema causing local mass effect. The mass has 2 different textures with a superficial more hypointense and hypoenhancing nodule and a more elongated avidly enhancing and fluid signal component extending through the white matter. The total, maximal length of the enhancing portion is 2.5 cm. Additional enhancing lesions: 1. 3 mm along the right parietal cerebral convexity on 10:31 2. Subcentimeter in the inferior right cerebellum on 10:8 3. Subcentimeter in the left cerebellum on 10:15. 4. Subcentimeter along the left frontal operculum on 10:26 and 12:20 This has a metastatic pattern which correlates well with the other findings on CTA of the neck. No acute infarct, acute hemorrhage, hydrocephalus, or extra-axial collection. Vascular: Preserved flow voids and vascular enhancements Skull and upper cervical spine: Negative for marrow lesion Sinuses/Orbits: Bilateral cataract resection. IMPRESSION: Five brain metastases, most notably a 2.5 cm enhancing lesion in the left parietal lobe with extensive vasogenic edema. Electronically Signed   By: JMonte FantasiaM.D.   On: 09/01/2019 17:02   CT ABDOMEN PELVIS W CONTRAST  Result Date: 09/02/2019 CLINICAL DATA:  Staging evaluation for multiple brain metastases. EXAM: CT CHEST, ABDOMEN, AND PELVIS WITH CONTRAST TECHNIQUE: Multidetector CT imaging of the chest, abdomen and pelvis was performed following the standard protocol  during bolus administration of intravenous contrast. CONTRAST:  1018mOMNIPAQUE IOHEXOL 300 MG/ML  SOLN COMPARISON:  Chest, abdomen and pelvis exam of March of 2010. No recent cross-sectional imaging aside from neuro axis imaging is available for comparison FINDINGS: CT CHEST FINDINGS Cardiovascular: Scattered calcific and noncalcific atherosclerotic changes throughout the thoracic aorta of progressed since the previous study. Large areas of noncalcified plaque are demonstrated along the descending thoracic aorta. No signs of aneurysmal dilation. Heart size mildly enlarged with signs of mitral annular and aortic valvular calcifications. Calcified coronary artery disease. No pericardial effusion. Central pulmonary vasculature is normal. Mediastinum/Nodes: Bulky mediastinal lymphadenopathy. Extensive right paratracheal adenopathy, largest lymph node along the right paratracheal chain measuring 19 mm short axis (image 26, series 3) Small left supraclavicular lymph nodes with rounded morphology largest 1 cm (image 7, series 3) scattered small right supraclavicular lymph nodes as well. (Image 29, series 3) 19 mm left juxta hilar lymph node. Enlargement of subcarinal lymph nodes up to 2 cm in short axis as well. Lungs/Pleura: Signs of paraseptal and centrilobular emphysema. Multiple pulmonary nodules with the distribution compatible with metastatic disease. Right lower lobe pulmonary nodule (image 91, series 4) 1.3 cm. Right middle lobe nodule measuring 7 mm. 4 mm left lower lobe pulmonary nodule and at least 4 additional pulmonary nodules in the left chest. Pleural and parenchymal distortion of posterior right chest measuring approximately 3.3 x 1.6 cm. Six right upper lobe pulmonary nodules greater than 5 mm with at least 2-3 additional small nodules in the right upper lobe. Musculoskeletal: No signs of chest wall mass. CT ABDOMEN PELVIS FINDINGS Hepatobiliary: No signs of focal hepatic lesion. Sludge, small stones in  likely vicarious excretion previously administered contrast in the dependent gallbladder, no signs of pericholecystic stranding. No signs of  biliary ductal dilation. Pancreas: Unremarkable. No pancreatic ductal dilatation or surrounding inflammatory changes. Spleen: Normal in size without focal abnormality. Adrenals/Urinary Tract: Normal adrenal glands. Cyst arising from upper pole left kidney. No signs of hydronephrosis. Symmetric renal enhancement. Stomach/Bowel: Small hiatal hernia. No signs of bowel obstruction or acute bowel process. Signs of colonic diverticulosis mainly in descending and sigmoid colon. Vascular/Lymphatic: Extensive calcified and noncalcified plaque in the abdominal aorta. Maximal caliber of the abdominal aorta 3.5 x 3.3 cm. 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 Major visceral branches are patent. No signs of upper abdominal adenopathy. No signs of pelvic lymphadenopathy. Reproductive: Question hyperenhancement of left hemi prostate. This could also be related to calcification within the gland, nonspecific finding overall. Other: No signs of free air or acute intra-abdominal process such as abscess or ascites. Musculoskeletal: Spinal degenerative change. No acute or destructive bone process. IMPRESSION: 1. Bulky mediastinal adenopathy in supraclavicular adenopathy with multiple pulmonary nodules compatible with metastatic disease. 2. Process potentially related to primary lung cancer. 3. 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 This may be a chronic finding but was not present in 2010. Consider correlation with any symptoms of pain in this area and follow-up vascular surgery  consultation, more urgent if the patient is experiencing symptoms that would suggest instability of this process, i.e. acute back pain. 4. Question hyperenhancement of the left hemiprostate. This could also be related to calcification within the gland, nonspecific overall finding. Correlation with PSA may be helpful given other findings on today's scan that are compatible with metastatic disease. 5. Emphysema and aortic atherosclerosis. Aortic Atherosclerosis (ICD10-I70.0) and Emphysema (ICD10-J43.9). Electronically Signed   By: Zetta Bills M.D.   On: 09/02/2019 13:52   EEG adult  Result Date: 09/02/2019 Lora Havens, MD     09/02/2019  9:24 AM Patient Name: ZOHAN SHIFLET MRN: [id number] Epilepsy Attending: Lora Havens Referring Physician/Provider: Dr. 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  EEG to evaluate for seizures. Level of alertness: Awake, asleep AEDs during EEG study: Keppra Technical aspects: This EEG study was done with scalp electrodes positioned according to the 10-20 International system of electrode placement. Electrical activity was acquired at a sampling rate of '500Hz'$  and reviewed with a high frequency filter of '70Hz'$  and a low frequency filter of '1Hz'$ . EEG data were recorded continuously and digitally stored. Description: During awake state, the posterior dominant rhythm consists of 9-10 Hz activity of moderate voltage (25-35 uV) seen predominantly in posterior head regions, symmetric and reactive to eye opening and eye closing.  Sleep was characterized by vertex waves, sleep spindles (12 to 14 Hz), maximal frontocentral.  EEG showed continuous generalized 3 to 5 Hz theta-delta slowing in left frontotemporal region.  Sharp wave was also seen in left frontotemporal region, maximal T3. Hyperventilation and photic stimulation were not  performed. Abnormality -Continuous slow, left frontotemporal -Sharp wave, left frontotemporal IMPRESSION: This study showed evidence of epileptogenic city as well as cortical dysfunction in left frontotemporal region likely secondary to underlying metastatic disease.  No seizures were seen throughout the recording.   [place] CORE BIOPSY (LYMPH NODES)  Result  Date: 09/03/2019 INDICATION: 78 year old with evidence for metastatic disease. Patient has brain lesions, extensive chest lymphadenopathy, lung nodules and supraclavicular lymphadenopathy. Tissue diagnosis is needed. EXAM: ULTRASOUND-GUIDED BIOPSY OF RIGHT SUPRACLAVICULAR LYMPH NODE MEDICATIONS: None. ANESTHESIA/SEDATION: None FLUOROSCOPY TIME:  None COMPLICATIONS: None immediate. PROCEDURE: Informed written consent was obtained from the patient after a thorough discussion of the procedural risks, benefits and alternatives. All questions were addressed. A timeout was performed prior to the initiation of the procedure. The supraclavicular areas were evaluated with ultrasound. Multiple small abnormal right supraclavicular lymph nodes were identified. A lymph node lateral to the right internal jugular vein was targeted for ultrasound-guided biopsy. The neck was prepped with chlorhexidine and sterile field was created. Skin and soft tissues were anesthetized with 1% lidocaine. A small skin incision was made. Using ultrasound guidance, an 18 gauge [place] core needle was directed into a lymph node lateral to the right internal jugular vein. Total of 5 core biopsies were obtained and specimens were placed in saline. Bandage placed over the puncture site. FINDINGS: Multiple rounded hypoechoic lymph nodes in the right supraclavicular area. Core biopsies obtained from a suspicious lymph node lateral to the right internal jugular vein. No significant bleeding or hematoma formation following the core biopsies. Adequate core specimens obtained. IMPRESSION:  Ultrasound-guided core biopsies from a right supraclavicular lymph node. Electronically Signed   By: Markus Daft M.D.   On: 09/03/2019 16:35   CT HEAD CODE STROKE WO CONTRAST  Result Date: 09/01/2019 CLINICAL DATA:  Code stroke. EXAM: CT HEAD WITHOUT CONTRAST TECHNIQUE: Contiguous axial images were obtained from the base of the skull through the vertex without intravenous contrast. COMPARISON:  None. FINDINGS: Brain: Extensive edema in the left posterior frontal and parietal hemisphere with gray matter sparing, cytotoxic appearing. A discrete mass is not clearly seen. No hemorrhage, hydrocephalus, or collection. Vascular: Atherosclerotic calcification.  No hyperdense vessel Skull: No acute finding. Periodontal erosions which are partially covered. Sinuses/Orbits: Bilateral cataract resection. Pending direct communication with Neurology team. ASPECTS [facility] Stroke Program Early CT Score) Not scored in this setting IMPRESSION: Extensive edema in the left posterior frontal and parietal lobes with vasogenic appearance suggesting underlying mass or inflammation/infection. Electronically Signed   By: Monte Fantasia M.D.   On: 09/01/2019 14:15     ASSESSMENT/PLAN:  This is a very pleasant 78 year old Caucasian male diagnosed with stage IV (T1b, N3, M1C) non-small cell lung cancer, adenocarcinoma.  He presented with multiple pulmonary nodules in the right lung and left lung in addition to mediastinal and supraclavicular lymph adenopathy.  He also has multiple metastatic brain lesions.  He has __actionable mutations and his PD-L1 expression is __.  He was diagnosed in December 2020.  The patient recently completed SRS to the brain lesions under the care of Dr. Lisbeth Renshaw.  The patient initial staging PET scan is scheduled fpr __.  The patient was seen with Dr. Earlie Server today.  Dr. Earlie Server had a lengthy discussion with the patient and his wife about his current condition and treatment options.  Dr. Earlie Server  recommends __.   The patient is interested in this option he is expected to receive his first dose on__.  Dr. Earlie Server discussed the adverse side effects of treatment including but not limited to __  I will arrange for __  We will see the patient back for follow-up visit in __for evaluation in 1 week follow-up visit.  I have sent several prescriptions to the patient's pharmacy including __.  Smoking cessation.   Decadron  taper.  The patient was advised to call immediately if he has any concerning symptoms in the interval. The patient voices understanding of current disease status and treatment options and is in agreement with the current care plan. All questions were answered. The patient knows to call the clinic with any problems, questions or concerns. We can certainly see the patient much sooner if necessary    No orders of the defined types were placed in this encounter.    [place], PA-C 09/22/19

## 2019-09-23 ENCOUNTER — Telehealth: Payer: Self-pay | Admitting: Physician Assistant

## 2019-09-23 ENCOUNTER — Inpatient Hospital Stay: Payer: Medicare Other

## 2019-09-23 ENCOUNTER — Inpatient Hospital Stay: Payer: Medicare Other | Admitting: Physician Assistant

## 2019-09-23 NOTE — Telephone Encounter (Signed)
Returned patient's phone call regarding cancelling 12/21 appointments, patient will call when ready to reschedule.

## 2019-09-24 ENCOUNTER — Telehealth: Payer: Self-pay | Admitting: Physician Assistant

## 2019-09-24 NOTE — Telephone Encounter (Signed)
R/s appt per 12/21 sch message - pt is aware of apt date and time

## 2019-10-01 ENCOUNTER — Emergency Department (HOSPITAL_COMMUNITY): Payer: Medicare Other

## 2019-10-01 ENCOUNTER — Telehealth: Payer: Self-pay | Admitting: Internal Medicine

## 2019-10-01 ENCOUNTER — Inpatient Hospital Stay (HOSPITAL_COMMUNITY)
Admission: EM | Admit: 2019-10-01 | Discharge: 2019-10-07 | DRG: 054 | Disposition: A | Discharge status: Rehabilitation Facility | Payer: Medicare Other | Attending: Internal Medicine | Admitting: Internal Medicine

## 2019-10-01 ENCOUNTER — Encounter (HOSPITAL_COMMUNITY): Payer: Medicare Other

## 2019-10-01 ENCOUNTER — Other Ambulatory Visit: Payer: Self-pay

## 2019-10-01 ENCOUNTER — Encounter (HOSPITAL_COMMUNITY): Payer: Self-pay

## 2019-10-01 DIAGNOSIS — E785 Hyperlipidemia, unspecified: Secondary | ICD-10-CM | POA: Diagnosis present

## 2019-10-01 DIAGNOSIS — I700 Atherosclerosis of aorta: Secondary | ICD-10-CM | POA: Diagnosis present

## 2019-10-01 DIAGNOSIS — I5032 Chronic diastolic (congestive) heart failure: Secondary | ICD-10-CM | POA: Diagnosis not present

## 2019-10-01 DIAGNOSIS — F17210 Nicotine dependence, cigarettes, uncomplicated: Secondary | ICD-10-CM | POA: Diagnosis present

## 2019-10-01 DIAGNOSIS — K219 Gastro-esophageal reflux disease without esophagitis: Secondary | ICD-10-CM | POA: Diagnosis present

## 2019-10-01 DIAGNOSIS — G40101 Localization-related (focal) (partial) symptomatic epilepsy and epileptic syndromes with simple partial seizures, not intractable, with status epilepticus: Secondary | ICD-10-CM | POA: Diagnosis present

## 2019-10-01 DIAGNOSIS — C799 Secondary malignant neoplasm of unspecified site: Secondary | ICD-10-CM | POA: Diagnosis not present

## 2019-10-01 DIAGNOSIS — C7931 Secondary malignant neoplasm of brain: Principal | ICD-10-CM | POA: Diagnosis present

## 2019-10-01 DIAGNOSIS — Z79899 Other long term (current) drug therapy: Secondary | ICD-10-CM | POA: Diagnosis not present

## 2019-10-01 DIAGNOSIS — R011 Cardiac murmur, unspecified: Secondary | ICD-10-CM | POA: Diagnosis not present

## 2019-10-01 DIAGNOSIS — Z7984 Long term (current) use of oral hypoglycemic drugs: Secondary | ICD-10-CM

## 2019-10-01 DIAGNOSIS — Z8701 Personal history of pneumonia (recurrent): Secondary | ICD-10-CM

## 2019-10-01 DIAGNOSIS — Z7289 Other problems related to lifestyle: Secondary | ICD-10-CM | POA: Diagnosis not present

## 2019-10-01 DIAGNOSIS — R519 Headache, unspecified: Secondary | ICD-10-CM | POA: Diagnosis present

## 2019-10-01 DIAGNOSIS — Z720 Tobacco use: Secondary | ICD-10-CM | POA: Diagnosis not present

## 2019-10-01 DIAGNOSIS — G936 Cerebral edema: Secondary | ICD-10-CM | POA: Diagnosis present

## 2019-10-01 DIAGNOSIS — Z23 Encounter for immunization: Secondary | ICD-10-CM

## 2019-10-01 DIAGNOSIS — Z20822 Contact with and (suspected) exposure to covid-19: Secondary | ICD-10-CM | POA: Diagnosis present

## 2019-10-01 DIAGNOSIS — C3490 Malignant neoplasm of unspecified part of unspecified bronchus or lung: Secondary | ICD-10-CM | POA: Diagnosis not present

## 2019-10-01 DIAGNOSIS — I5022 Chronic systolic (congestive) heart failure: Secondary | ICD-10-CM | POA: Diagnosis present

## 2019-10-01 DIAGNOSIS — G40909 Epilepsy, unspecified, not intractable, without status epilepticus: Secondary | ICD-10-CM | POA: Diagnosis not present

## 2019-10-01 DIAGNOSIS — I110 Hypertensive heart disease with heart failure: Secondary | ICD-10-CM | POA: Diagnosis present

## 2019-10-01 DIAGNOSIS — G40901 Epilepsy, unspecified, not intractable, with status epilepticus: Secondary | ICD-10-CM

## 2019-10-01 DIAGNOSIS — I350 Nonrheumatic aortic (valve) stenosis: Secondary | ICD-10-CM | POA: Diagnosis present

## 2019-10-01 DIAGNOSIS — C3491 Malignant neoplasm of unspecified part of right bronchus or lung: Secondary | ICD-10-CM | POA: Diagnosis present

## 2019-10-01 DIAGNOSIS — G9340 Encephalopathy, unspecified: Secondary | ICD-10-CM | POA: Diagnosis present

## 2019-10-01 DIAGNOSIS — G40001 Localization-related (focal) (partial) idiopathic epilepsy and epileptic syndromes with seizures of localized onset, not intractable, with status epilepticus: Secondary | ICD-10-CM | POA: Diagnosis not present

## 2019-10-01 DIAGNOSIS — Z923 Personal history of irradiation: Secondary | ICD-10-CM

## 2019-10-01 DIAGNOSIS — E119 Type 2 diabetes mellitus without complications: Secondary | ICD-10-CM

## 2019-10-01 DIAGNOSIS — Z7982 Long term (current) use of aspirin: Secondary | ICD-10-CM

## 2019-10-01 DIAGNOSIS — R1310 Dysphagia, unspecified: Secondary | ICD-10-CM | POA: Diagnosis present

## 2019-10-01 DIAGNOSIS — I639 Cerebral infarction, unspecified: Secondary | ICD-10-CM | POA: Diagnosis present

## 2019-10-01 DIAGNOSIS — I5020 Unspecified systolic (congestive) heart failure: Secondary | ICD-10-CM | POA: Diagnosis not present

## 2019-10-01 DIAGNOSIS — J439 Emphysema, unspecified: Secondary | ICD-10-CM | POA: Diagnosis present

## 2019-10-01 DIAGNOSIS — G40109 Localization-related (focal) (partial) symptomatic epilepsy and epileptic syndromes with simple partial seizures, not intractable, without status epilepticus: Secondary | ICD-10-CM

## 2019-10-01 DIAGNOSIS — F329 Major depressive disorder, single episode, unspecified: Secondary | ICD-10-CM | POA: Diagnosis present

## 2019-10-01 DIAGNOSIS — Z823 Family history of stroke: Secondary | ICD-10-CM

## 2019-10-01 DIAGNOSIS — Z841 Family history of disorders of kidney and ureter: Secondary | ICD-10-CM

## 2019-10-01 DIAGNOSIS — R569 Unspecified convulsions: Secondary | ICD-10-CM | POA: Diagnosis not present

## 2019-10-01 LAB — RAPID URINE DRUG SCREEN, HOSP PERFORMED
Amphetamines: NOT DETECTED
Barbiturates: NOT DETECTED
Benzodiazepines: POSITIVE — AB
Cocaine: NOT DETECTED
Opiates: NOT DETECTED
Tetrahydrocannabinol: NOT DETECTED

## 2019-10-01 LAB — URINALYSIS, ROUTINE W REFLEX MICROSCOPIC
Bilirubin Urine: NEGATIVE
Glucose, UA: NEGATIVE mg/dL
Hgb urine dipstick: NEGATIVE
Ketones, ur: NEGATIVE mg/dL
Leukocytes,Ua: NEGATIVE
Nitrite: NEGATIVE
Protein, ur: NEGATIVE mg/dL
Specific Gravity, Urine: 1.013 (ref 1.005–1.030)
pH: 6 (ref 5.0–8.0)

## 2019-10-01 LAB — CBC WITH DIFFERENTIAL/PLATELET
Abs Immature Granulocytes: 0.08 10*3/uL — ABNORMAL HIGH (ref 0.00–0.07)
Basophils Absolute: 0.1 10*3/uL (ref 0.0–0.1)
Basophils Relative: 1 %
Eosinophils Absolute: 0.3 10*3/uL (ref 0.0–0.5)
Eosinophils Relative: 4 %
HCT: 37.3 % — ABNORMAL LOW (ref 39.0–52.0)
Hemoglobin: 12.2 g/dL — ABNORMAL LOW (ref 13.0–17.0)
Immature Granulocytes: 1 %
Lymphocytes Relative: 14 %
Lymphs Abs: 1.1 10*3/uL (ref 0.7–4.0)
MCH: 30.7 pg (ref 26.0–34.0)
MCHC: 32.7 g/dL (ref 30.0–36.0)
MCV: 94 fL (ref 80.0–100.0)
Monocytes Absolute: 0.6 10*3/uL (ref 0.1–1.0)
Monocytes Relative: 7 %
Neutro Abs: 6 10*3/uL (ref 1.7–7.7)
Neutrophils Relative %: 73 %
Platelets: 184 10*3/uL (ref 150–400)
RBC: 3.97 MIL/uL — ABNORMAL LOW (ref 4.22–5.81)
RDW: 14.1 % (ref 11.5–15.5)
WBC: 8.2 10*3/uL (ref 4.0–10.5)
nRBC: 0 % (ref 0.0–0.2)

## 2019-10-01 LAB — I-STAT CHEM 8, ED
BUN: 14 mg/dL (ref 8–23)
Calcium, Ion: 1.11 mmol/L — ABNORMAL LOW (ref 1.15–1.40)
Chloride: 98 mmol/L (ref 98–111)
Creatinine, Ser: 0.7 mg/dL (ref 0.61–1.24)
Glucose, Bld: 119 mg/dL — ABNORMAL HIGH (ref 70–99)
HCT: 36 % — ABNORMAL LOW (ref 39.0–52.0)
Hemoglobin: 12.2 g/dL — ABNORMAL LOW (ref 13.0–17.0)
Potassium: 3.4 mmol/L — ABNORMAL LOW (ref 3.5–5.1)
Sodium: 138 mmol/L (ref 135–145)
TCO2: 31 mmol/L (ref 22–32)

## 2019-10-01 LAB — RESPIRATORY PANEL BY RT PCR (FLU A&B, COVID)
Influenza A by PCR: NEGATIVE
Influenza B by PCR: NEGATIVE
SARS Coronavirus 2 by RT PCR: NEGATIVE

## 2019-10-01 LAB — COMPREHENSIVE METABOLIC PANEL
ALT: 32 U/L (ref 0–44)
AST: 16 U/L (ref 15–41)
Albumin: 2.5 g/dL — ABNORMAL LOW (ref 3.5–5.0)
Alkaline Phosphatase: 68 U/L (ref 38–126)
Anion gap: 13 (ref 5–15)
BUN: 13 mg/dL (ref 8–23)
CO2: 26 mmol/L (ref 22–32)
Calcium: 8.6 mg/dL — ABNORMAL LOW (ref 8.9–10.3)
Chloride: 100 mmol/L (ref 98–111)
Creatinine, Ser: 0.87 mg/dL (ref 0.61–1.24)
GFR calc Af Amer: >60 mL/min (ref >60)
GFR calc non Af Amer: >60 mL/min (ref >60)
Glucose, Bld: 125 mg/dL — ABNORMAL HIGH (ref 70–99)
Potassium: 3.5 mmol/L (ref 3.5–5.1)
Sodium: 139 mmol/L (ref 135–145)
Total Bilirubin: 0.7 mg/dL (ref 0.3–1.2)
Total Protein: 6 g/dL — ABNORMAL LOW (ref 6.5–8.1)

## 2019-10-01 LAB — PROTIME-INR
INR: 1 (ref 0.8–1.2)
Prothrombin Time: 13.2 seconds (ref 11.4–15.2)

## 2019-10-01 LAB — LACTIC ACID, PLASMA: Lactic Acid, Venous: 2.7 mmol/L (ref 0.5–1.9)

## 2019-10-01 LAB — POC SARS CORONAVIRUS 2 AG -  ED: SARS Coronavirus 2 Ag: NEGATIVE

## 2019-10-01 LAB — ETHANOL: Alcohol, Ethyl (B): <10 mg/dL (ref <10)

## 2019-10-01 MED ORDER — SODIUM CHLORIDE 0.9 % IV SOLN
20.0000 mg/kg | Freq: Once | INTRAVENOUS | Status: AC
  Administered 2019-10-01: 1400 mg via INTRAVENOUS
  Filled 2019-10-01: qty 28

## 2019-10-01 MED ORDER — DEXAMETHASONE SODIUM PHOSPHATE 10 MG/ML IJ SOLN
10.0000 mg | Freq: Once | INTRAMUSCULAR | Status: AC
  Administered 2019-10-01: 10 mg via INTRAVENOUS
  Filled 2019-10-01: qty 1

## 2019-10-01 MED ORDER — DEXAMETHASONE SODIUM PHOSPHATE 4 MG/ML IJ SOLN
4.0000 mg | Freq: Four times a day (QID) | INTRAMUSCULAR | Status: DC
  Administered 2019-10-01 – 2019-10-04 (×9): 4 mg via INTRAVENOUS
  Filled 2019-10-01 (×9): qty 1

## 2019-10-01 MED ORDER — POTASSIUM CHLORIDE IN NACL 40-0.9 MEQ/L-% IV SOLN
INTRAVENOUS | Status: DC
  Administered 2019-10-02 – 2019-10-06 (×7): 75 mL/h via INTRAVENOUS
  Filled 2019-10-01 (×9): qty 1000

## 2019-10-01 MED ORDER — ENOXAPARIN SODIUM 40 MG/0.4ML ~~LOC~~ SOLN
40.0000 mg | SUBCUTANEOUS | Status: DC
  Administered 2019-10-02 – 2019-10-03 (×2): 40 mg via SUBCUTANEOUS
  Filled 2019-10-01 (×2): qty 0.4

## 2019-10-01 MED ORDER — LORAZEPAM 2 MG/ML IJ SOLN
1.0000 mg | Freq: Once | INTRAMUSCULAR | Status: DC
  Filled 2019-10-01: qty 1

## 2019-10-01 MED ORDER — LEVETIRACETAM IN NACL 1000 MG/100ML IV SOLN
1000.0000 mg | Freq: Once | INTRAVENOUS | Status: AC
  Administered 2019-10-01: 1000 mg via INTRAVENOUS
  Filled 2019-10-01: qty 100

## 2019-10-01 MED ORDER — LORAZEPAM 2 MG/ML IJ SOLN
1.0000 mg | Freq: Once | INTRAMUSCULAR | Status: AC
  Administered 2019-10-01: 1 mg via INTRAVENOUS
  Filled 2019-10-01: qty 1

## 2019-10-01 MED ORDER — INSULIN ASPART 100 UNIT/ML ~~LOC~~ SOLN
0.0000 [IU] | Freq: Three times a day (TID) | SUBCUTANEOUS | Status: DC
  Administered 2019-10-02: 2 [IU] via SUBCUTANEOUS
  Administered 2019-10-04: 3 [IU] via SUBCUTANEOUS
  Administered 2019-10-04: 1 [IU] via SUBCUTANEOUS
  Administered 2019-10-05: 2 [IU] via SUBCUTANEOUS
  Administered 2019-10-05: 3 [IU] via SUBCUTANEOUS
  Administered 2019-10-06: 1 [IU] via SUBCUTANEOUS
  Administered 2019-10-06: 3 [IU] via SUBCUTANEOUS
  Administered 2019-10-06: 2 [IU] via SUBCUTANEOUS
  Administered 2019-10-07: 3 [IU] via SUBCUTANEOUS
  Administered 2019-10-07: 2 [IU] via SUBCUTANEOUS

## 2019-10-01 MED ORDER — SODIUM CHLORIDE 0.9 % IV BOLUS
1000.0000 mL | Freq: Once | INTRAVENOUS | Status: AC
  Administered 2019-10-01: 1000 mL via INTRAVENOUS

## 2019-10-01 MED ORDER — SODIUM CHLORIDE 0.9 % IV SOLN: Freq: Once | INTRAVENOUS | Status: AC

## 2019-10-01 NOTE — ED Notes (Signed)
Pt noted to be asleep, gargling. This RN suctioned mouth, pt still gargling some, oxygen saturation dropped to 85%, pt placed on 3.5L [place] but seems to be primarily mouth breathing, Dr. Johnney Killian made aware and will go assess pt.

## 2019-10-01 NOTE — ED Notes (Signed)
Neuro at bedside.

## 2019-10-01 NOTE — Progress Notes (Signed)
LTM started; nurse will call EEG when patient gets a room.

## 2019-10-01 NOTE — ED Notes (Signed)
Date and time results received: 10/01/19 1303 (use smartphrase ".now" to insert current time)  Test: lactic acid Critical Value: 2.7  Name of Provider Notified: Dr Colvin Caroli  Orders Received? Or Actions Taken?: no new orders

## 2019-10-01 NOTE — Procedures (Addendum)
Patient Name: [name]  MRN: [id number]  Epilepsy Attending: Lora Havens  Referring Physician/Provider: Dr. Amie Portland Date: 10/01/2019 Duration: 25.35 minutes  Patient history: 78 year old male with multiple brain mets, largest in left parietal region who presented with status epilepticus.  EEG to evaluate for seizures.  Level of alertness: Awake, drowsy/sedated  AEDs during EEG study: Keppra, phenytoin, Ativan  Technical aspects: This EEG study was done with scalp electrodes positioned according to the 10-20 International system of electrode placement. Electrical activity was acquired at a sampling rate of 500Hz  and reviewed with a high frequency filter of 70Hz  and a low frequency filter of 1Hz . EEG data were recorded continuously and digitally stored.   Description: At the beginning of the study, patient was noted to have right facial twitching which stopped at 1332.  Concomitant EEG showed rhythmic 2 to 5 Hz theta-delta slowing in left hemisphere.  EEG also showed continuous 2 to 6 Hz theta-delta slowing in her left hemisphere admixed with 13 to 15 Hz generalized beta activity.  Hyperventilation and photic stimulation were not performed.  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  During the seizure, patient was noted to have right facial twitching.  Seizure ended at 1332.  EEG also showed evidence of cortical dysfunction in left hemisphere likely secondary to underlying metastatic disease. The excessive beta activity seen in the background is most likely due to the effect of benzodiazepine and is a benign EEG pattern.  Dr. Rory Percy was notified and study was transitioned to long-term EEG.  Geoffrey Benjamin Geoffrey Benjamin

## 2019-10-01 NOTE — ED Notes (Signed)
Pt returned from CT, pts arm is not twitching as much as before but now pts head is twitching, Dr. Rory Percy paged to inform, awaiting response.

## 2019-10-01 NOTE — ED Notes (Signed)
Wife, [name], to be updated at [phone]. Home number [phone].

## 2019-10-01 NOTE — ED Notes (Signed)
Nurse Navigator called pt wife to update her, did not reach

## 2019-10-01 NOTE — ED Triage Notes (Signed)
Pt from home with ems for possible seizures. Per wife at 930 this morning pt started shaking right arm uncontrollably, when fire arrived pt was oriented and speech was clear, when EMS arrived pt had increased slurred speech. Pt given 7.5 versed en route, pt speech more slurred en route. Pt able to follow commands. Pt has hx of stage 4 brain cancer.

## 2019-10-01 NOTE — ED Notes (Signed)
Nurse Navigator called and spoke with wife.  She had spoken with admitting MD and had her questions answered

## 2019-10-01 NOTE — ED Notes (Signed)
Patient transported to CT 

## 2019-10-01 NOTE — ED Notes (Signed)
Pt twitching has now resolved

## 2019-10-01 NOTE — Progress Notes (Signed)
Informed by RN of patient now being more awake and no further twitching but flaccid right arm.  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  -- Amie Portland, MD [facility] Pager: [phone] If 7pm to 7am, please call on call as listed on AMION.

## 2019-10-01 NOTE — ED Notes (Signed)
EEG at bedside.

## 2019-10-01 NOTE — ED Notes (Signed)
Dr. Johnney Killian in to assess pt, seems to be maintaining airway at this time, pt on 3.5L [place] with saturations at 94%. Pt Bp dropped to 80s, verbal order to bolus in fluids that are hanging at this time.

## 2019-10-01 NOTE — Consult Note (Addendum)
Neurology Consultation  Reason for Consult: Seizure Referring Physician: Dr. Vallery Ridge  CC: Seizure, history of brain tumor  History is obtained from: Chart review, patient  HPI: Geoffrey Benjamin is a 78 y.o. 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 Patient's wife called EMS because he started twitching on the right face and right arm with preserved consciousness.  He had received Versed on the way by EMS with some improvement in the twitching but it has not completely subsided. In the emergency room, he received 1 g of Keppra IV with continued twitching. Denies any visual symptoms.  Denies any headaches. Currently on Decadron 4 mg twice daily, Keppra 500 twice daily. Also on Eliquis for DVT prophylaxis.    ROS: ROS was performed and is negative except as noted in the HPI.    Past Medical History:  Diagnosis Date  . Aortic stenosis 09/13/2018  . CAP (community acquired pneumonia) 09/13/2018  . CHF (congestive heart failure) ([place])   . Difficulty walking   . DM (diabetes mellitus) ([place])   . GERD 06/04/2009   Qualifier: Diagnosis of  By: Henrene Pastor MD, Docia Chuck   . Hyperlipidemia 09/13/2018  . Hypertension   . Loss of weight 09/29/2008   Qualifier: Diagnosis of  By: Bobby Rumpf CMA (AAMA), [place]    . Non-small cell carcinoma of right lung, stage 4 ([place])   . PERSONAL HX COLONIC POLYPS 06/04/2009   Qualifier: Diagnosis of  By: Henrene Pastor MD, [place] PAIN 09/29/2008   Qualifier: Diagnosis of  By:  Bobby Rumpf CMA (AAMA), Patty    . Tachycardia   . Tobacco use 09/13/2018  . Volume overload 09/13/2018    Family History  Problem Relation Age of Onset  . Kidney disease Father   . Stroke Mother     Social History:   reports that he has been smoking cigarettes. He has been smoking about 1.50 packs per day. He has never used smokeless tobacco. He reports current alcohol use. He reports that he does not use drugs.  Medications  Current Facility-Administered Medications:  .  levETIRAcetam (KEPPRA) IVPB 1000 mg/100 mL premix, 1,000 mg, Intravenous, Once, Pfeiffer, Marcy, MD, Last Rate: 400 mL/hr at 10/01/19 1137, 1,000 mg at 10/01/19 1137 .  levETIRAcetam (KEPPRA) IVPB 1000 mg/100 mL premix, 1,000 mg, Intravenous, Once, Pfeiffer, Marcy, MD  Current Outpatient Medications:  .  apixaban (ELIQUIS) 2.5 MG TABS tablet, Take 1 tablet (2.5 mg total) by mouth 2 (two) times daily., Disp: 60 tablet, Rfl: 2 .  aspirin EC 81 MG tablet, Take 81 mg by mouth daily., Disp: , Rfl:  .  atorvastatin (LIPITOR) 80 MG tablet, Take 80 mg by mouth daily., Disp: , Rfl:  .  dexamethasone (DECADRON) 4 MG tablet, Take 1 tablet (4 mg total) by mouth 2 (two) times daily., Disp: 60 tablet, Rfl: 0 .  glipiZIDE (GLUCOTROL) 5 MG tablet, Take 5 mg by mouth daily before breakfast., Disp: , Rfl:  .  levETIRAcetam (KEPPRA) 500 MG tablet, Take 1 tablet (500 mg total) by mouth 2 (two) times daily., Disp: 60  tablet, Rfl: 3 .  metFORMIN (GLUCOPHAGE) 1000 MG tablet, Take 1 tablet (1,000 mg total) by mouth 2 (two) times daily with a meal., Disp: 30 tablet, Rfl: 0 .  metoprolol tartrate (LOPRESSOR) 25 MG tablet, Take 1 tablet (25 mg total) by mouth 2 (two) times daily., Disp: 180 tablet, Rfl: 3 .  Omega-3 Fatty Acids (FISH OIL) 1200 MG CAPS, Take 1,200 mg by mouth daily., Disp: , Rfl:  .  pantoprazole (PROTONIX) 40 MG tablet, Take 1 tablet (40 mg total) by mouth daily. (Patient taking differently: Take 40 mg by mouth every evening. ),  Disp: 30 tablet, Rfl: 11 .  zolpidem (AMBIEN) 5 MG tablet, Take 1 tablet (5 mg total) by mouth at bedtime as needed for sleep., Disp: 30 tablet, Rfl: 0   Exam: Current vital signs: BP 122/68   Pulse (!) 104   Temp 98.4 F (36.9 C) (Oral)   Resp (!) 27   SpO2 94%  Vital signs in last 24 hours: Temp:  [98.4 F (36.9 C)] 98.4 F (36.9 C) (12/29 1121) Pulse Rate:  [104] 104 (12/29 1121) Resp:  [27] 27 (12/29 1121) BP: (122)/(68) 122/68 (12/29 1137) SpO2:  [94 %] 94 % (12/29 1121) General: Awake alert, in mild distress to the twitching. HEENT: Normocephalic atraumatic dry mucous membranes Lungs: Clear to auscultation Cardiovascular: Regular rate Abdomen: Soft nondistended nontender Extremities: Slightly pale appearing legs but intact pulses. Neurological exam Awake alert oriented x3 Speech is mildly dysarthric No evidence of aphasia Decreased attention concentration Cranial nerves: Pupils equal round reactive light, extraocular movements intact with no gaze preference or deviation, face is symmetric but is continuously twitching during this exam.  Tongue is midline. Motor exam: There is continue stretching very rhythmically of the right face and arm.  Left arm is antigravity 5/5.  Both lower extremities are barely 2/5. Sensory exam: Intact to touch Difficult assess coordination due to the ongoing twitching on the right.  Labs I have reviewed labs in epic and the results pertinent to this consultation are:  CBC    Component Value Date/Time   WBC 13.5 (H) 09/04/2019 0207   RBC 3.75 (L) 09/04/2019 0207   HGB 11.8 (L) 09/04/2019 0207   HGB 15.9 12/23/2008 0921   HCT 33.9 (L) 09/04/2019 0207   HCT 45.6 12/23/2008 0921   PLT 315 09/04/2019 0207   PLT 281 12/23/2008 0921   MCV 90.4 09/04/2019 0207   MCV 93.9 12/23/2008 0921   MCH 31.5 09/04/2019 0207   MCHC 34.8 09/04/2019 0207   RDW 12.1 09/04/2019 0207   RDW 12.9 12/23/2008 0921   LYMPHSABS 1.4 09/01/2019 1348    LYMPHSABS 1.8 12/23/2008 0921   MONOABS 0.9 09/01/2019 1348   MONOABS 0.4 12/23/2008 0921   EOSABS 0.7 (H) 09/01/2019 1348   EOSABS 0.1 12/23/2008 0921   BASOSABS 0.1 09/01/2019 1348   BASOSABS 0.1 12/23/2008 0921    CMP     Component Value Date/Time   NA 137 09/04/2019 0207   K 4.1 09/04/2019 0207   CL 101 09/04/2019 0207   CO2 26 09/04/2019 0207   GLUCOSE 145 (H) 09/04/2019 0207   BUN 16 09/17/2019 1156   CREATININE 0.89 09/17/2019 1156   CALCIUM 9.0 09/04/2019 0207   PROT 6.2 (L) 09/01/2019 1348   ALBUMIN 3.0 (L) 09/01/2019 1348   AST 15 09/01/2019 1348   ALT 15 09/01/2019 1348   ALKPHOS 51 09/01/2019 1348   BILITOT 0.5 09/01/2019 1348   GFRNONAA >60 09/17/2019 1156  GFRAA >60 09/17/2019 1156   Imaging I have reviewed the images obtained:  CT-scan of the brain-no bleed. 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 Currently received benzodiazepines via EMS, 2 mg of Ativan in the ER along with Keppra 1 g IV. Continues to have twitching although says it is improved from before.  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  Recommendations: -Dexamethasone 10 mg IV x1. -Continue dexamethasone 4 mg every 6 hours.  We will update recommendations based on imaging if necessary. -Additional Ativan -Additional 1 g load of Keppra -If seizures continue, will load with fosphenytoin 20 mg PE per KG -Stat CT head as he is on apixaban-to rule out a bleed -Stat EEG after the CT scan. -Based on the result of stat EEG, will decide if  he needs LTM EEG. -Continue to maintain seizure precautions Plan discussed with the ED provider Dr. Vallery Ridge as well as patient's RN at bedside. -- Amie [place], MD [facility] Pager: [phone] If 7pm to 7am, please call on call as listed on AMION.  CRITICAL CARE ATTESTATION Performed by: Amie Portland, MD Total critical care time: 55 minutes Critical care time was exclusive of separately billable procedures and treating other patients and/or supervising APPs/Residents/Students Critical care was necessary to treat or prevent imminent or life-threatening deterioration due to epilepsia partialis continua/focal status epilepticus, brain metastases. This patient is critically ill and at significant risk for neurological worsening and/or death and care requires constant monitoring. Critical care was time spent personally by me on the following activities: development of treatment plan with patient and/or surrogate as well as nursing, discussions with consultants, evaluation of patient's response to treatment, examination of patient, obtaining history from patient or surrogate, ordering and performing treatments and interventions, ordering and review of laboratory studies, ordering and review of radiographic studies, pulse oximetry, re-evaluation of patient's condition, participation in multidisciplinary rounds and medical decision making of high complexity in the care of this patient.   Addendum EEG with seizures during the initial part of the recording that eventually resolved. I would continue the LTM EEG.  Clinically, he does not appear to be seizing anymore but I would want to ensure that seizures have completely stopped and do not require to decide ongoing antiepileptic treatment.  RECS UPDATED-in addition to above recommendations in the consult:  #For now in terms of antiepileptics: -Keppra 1000 mg twice daily which is doubled from his prior home dose -Will not continue  Dilantin unless he has another seizure.  #Continue dexamethasone as above.  #LTM EEG  #Seizure precautions   -- Amie [place], MD [facility] Pager: [phone] If 7pm to 7am, please call on call as listed on AMION.

## 2019-10-01 NOTE — Progress Notes (Signed)
STAT EEG completed; results pending. Dr Yadav notified. 

## 2019-10-01 NOTE — ED Notes (Signed)
SDU   [phone] Geoffrey Benjamin pts wife would like to know how pt is doing, please call

## 2019-10-01 NOTE — ED Notes (Signed)
Paged admitting team about bed request

## 2019-10-01 NOTE — ED Notes (Signed)
Pt more arousable now, able to follow commands, right arm completely flaccid, Dr Rory Percy made aware and states this is normal for his postictal state and swelling that is in his brain. Pt resting, nad noted at this time, will continue to monitor.

## 2019-10-01 NOTE — ED Provider Notes (Signed)
[place] EMERGENCY DEPARTMENT Provider Note   CSN: [id number] Arrival date & time: 10/01/19  1117     History Chief Complaint  Patient presents with  . Seizures    Geoffrey Benjamin is a 78 y.o. male.  HPI History is from EMS and some from the patient.  Reportedly the patient started having repetitive shaking motion of his right arm at 930 this morning.  It has been continuous.  EMS reports on their arrival the amount of right upper extremity tonic-clonic activity was very aggressive in appearance.  Patient also had some blinking and grimacing of the right side of the face.  Patient is known to have metastatic brain tumors.  He however by report, does not have prior seizure history.  He was able to speak with the medics and answer questions albeit speech slightly slurred.  He also could follow specific commands for movement except for the right upper extremity which has been repetitively exhibiting tonic-clonic movement.  They had administered a total 7.5 mg of Versed en route.  Medics estimate that the amplitude of the tonic-clonic activity is diminished by about 75% relative to when they started.  (Patient still has about 45 degrees of arm motion in tonic-clonic pattern with right sided facial grimacing on arrival to ED)    Past Medical History:  Diagnosis Date  . Aortic stenosis 09/13/2018  . CAP (community acquired pneumonia) 09/13/2018  . CHF (congestive heart failure) ([place])   . Difficulty walking   . DM (diabetes mellitus) ([place])   . GERD 06/04/2009   Qualifier: Diagnosis of  By: Geoffrey Benjamin, Geoffrey Benjamin   . Hyperlipidemia 09/13/2018  . Hypertension   . Loss of weight 09/29/2008   Qualifier: Diagnosis of  By: Geoffrey Benjamin (AAMA), Geoffrey Benjamin    . Non-small cell carcinoma of right lung, stage 4 ([place])   . PERSONAL HX COLONIC POLYPS 06/04/2009   Qualifier: Diagnosis of  By: Geoffrey Benjamin, [place] PAIN 09/29/2008   Qualifier: Diagnosis of  By: Geoffrey Benjamin (AAMA), Geoffrey Benjamin    .  Tachycardia   . Tobacco use 09/13/2018  . Volume overload 09/13/2018    Patient Active Problem List   Diagnosis Date Noted  . Adenocarcinoma of right lung, stage 4 ([place]) 09/05/2019  . Goals of care, counseling/discussion 09/05/2019  . Brain metastases ([place]) 09/02/2019  . Right sided weakness 09/01/2019  . Acute systolic congestive heart failure ([place]) 09/19/2018  . CAP (community acquired pneumonia) 09/13/2018  . Hyperlipidemia 09/13/2018  . Volume overload 09/13/2018  . Tobacco use 09/13/2018  . Aortic stenosis 09/13/2018  . Chronic pain 11/10/2015  . Hypertension 11/10/2015  . Tobacco dependence 11/10/2015  . GERD 06/04/2009  . PERSONAL HX COLONIC POLYPS 06/04/2009  . LOSS OF WEIGHT 09/29/2008  . RLQ PAIN 09/29/2008  . Type 2 diabetes mellitus ([place]) 09/22/2008    Past Surgical History:  Procedure Laterality Date  . CATARACT EXTRACTION, BILATERAL  08/2016  . DOPPLER ECHOCARDIOGRAPHY     Mitral inflow and tissue doppler consistent with impaired LV relaxation; Trileaflet aortic vlave with moderate aortic valve stenosis, Trivial mitral and tricuspid valve regurgitation.       Family History  Problem Relation Age of Onset  . Kidney disease Father   . Stroke Mother     Social History   Tobacco Use  . Smoking status: Current Every Day Smoker    Packs/day: 1.50    Types: Cigarettes  . Smokeless tobacco: Never Used  .  Tobacco comment: SMOKED FOR 60 PLUS YEARS  Substance Use Topics  . Alcohol use: Yes    Comment: occas  . Drug use: Never    Home Medications Prior to Admission medications   Medication Sig Start Date End Date Taking? Authorizing Provider  aspirin EC 81 MG tablet Take 81 mg by mouth daily.   Yes Provider, Historical, Benjamin  atorvastatin (LIPITOR) 80 MG tablet Take 80 mg by mouth daily. 08/26/19  Yes Provider, Historical, Benjamin  dexamethasone (DECADRON) 4 MG tablet Take 1 tablet (4 mg total) by mouth 2 (two) times daily. 09/10/19  Yes Geoffrey Pedro,  PA-C  glipiZIDE (GLUCOTROL) 5 MG tablet Take 5 mg by mouth daily before breakfast.   Yes Provider, Historical, Benjamin  levETIRAcetam (KEPPRA) 500 MG tablet Take 1 tablet (500 mg total) by mouth 2 (two) times daily. 09/04/19  Yes Geoffrey Payment, Benjamin  metFORMIN (GLUCOPHAGE) 1000 MG tablet Take 1 tablet (1,000 mg total) by mouth 2 (two) times daily with a meal. 09/15/18  Yes Geoffrey Pap N, DO  metoprolol tartrate (LOPRESSOR) 25 MG tablet Take 1 tablet (25 mg total) by mouth 2 (two) times daily. 10/12/18  Yes Geoffrey Hector, Benjamin  Omega-3 Fatty Acids (FISH OIL) 1200 MG CAPS Take 1,200 mg by mouth daily.   Yes Provider, Historical, Benjamin  pantoprazole (PROTONIX) 40 MG tablet Take 1 tablet (40 mg total) by mouth daily. Patient taking differently: Take 40 mg by mouth every evening.  10/16/18  Yes Geoffrey Hector, Benjamin    Allergies    Patient has no known allergies.  Review of Systems   Review of Systems Level 5 caveat cannot obtain review of systems due to patient condition. Physical Exam Updated Vital Signs BP (!) 101/54   Pulse 79   Temp 98.4 F (36.9 C) (Oral)   Resp (!) 22   SpO2 96%   Physical Exam Constitutional:      Comments: Patient is awake and alert.  He does not have respiratory distress.  He does have ongoing right arm tonic-clonic motion and right sided facial grimacing.  HENT:     Head: Normocephalic and atraumatic.     Mouth/Throat:     Comments: Airway is patent.  No pooling of secretions.  No sonorous respirations. Eyes:     Pupils: Pupils are equal, round, and reactive to light.  Cardiovascular:     Rate and Rhythm: Regular rhythm. Tachycardia present.  Pulmonary:     Effort: Pulmonary effort is normal.     Breath sounds: Normal breath sounds.  Abdominal:     General: There is no distension.     Palpations: Abdomen is soft.     Tenderness: There is no abdominal tenderness. There is no guarding.  Musculoskeletal:     Comments: Lower extremities are in good condition.  No  peripheral edema.  Calves are soft and pliable.  Skin:    General: Skin is warm and dry.  Neurological:     Comments: Patient is able to answer questions.  Speech is somewhat slurred.  Responses are situationally oriented but ability to get detailed information on cognitive function limited due to active seizure.  Patient has facial grimacing on the right side of the face with rapid blinking.  The right upper extremity has about 40 degrees of tonic-clonic repetitive motion.  Patient can grip with the left hand.  He can elevate the left lower extremity off the bed at command.  He can move the right lower extremity  at command but does seem somewhat weaker or more difficult compared to the left.     ED Results / Procedures / Treatments   Labs (all labs ordered are listed, but only abnormal results are displayed) Labs Reviewed  COMPREHENSIVE METABOLIC PANEL - Abnormal; Notable for the following components:      Result Value   Glucose, Bld 125 (*)    Calcium 8.6 (*)    Total Protein 6.0 (*)    Albumin 2.5 (*)    All other components within normal limits  LACTIC ACID, PLASMA - Abnormal; Notable for the following components:   Lactic Acid, Venous 2.7 (*)    All other components within normal limits  CBC WITH DIFFERENTIAL/PLATELET - Abnormal; Notable for the following components:   RBC 3.97 (*)    Hemoglobin 12.2 (*)    HCT 37.3 (*)    Abs Immature Granulocytes 0.08 (*)    All other components within normal limits  RAPID URINE DRUG SCREEN, HOSP PERFORMED - Abnormal; Notable for the following components:   Benzodiazepines POSITIVE (*)    All other components within normal limits  I-STAT CHEM 8, ED - Abnormal; Notable for the following components:   Potassium 3.4 (*)    Glucose, Bld 119 (*)    Calcium, Ion 1.11 (*)    Hemoglobin 12.2 (*)    HCT 36.0 (*)    All other components within normal limits  RESPIRATORY PANEL BY RT PCR (FLU A&B, COVID)  ETHANOL  PROTIME-INR  URINALYSIS, ROUTINE  W REFLEX MICROSCOPIC  LACTIC ACID, PLASMA  POC SARS CORONAVIRUS 2 AG -  ED    EKG None  Radiology CT Head Wo Contrast  Result Date: 10/01/2019 CLINICAL DATA:  Seizure, nontraumatic. Additional history obtained from previous radiology examinations: EXAM: CT HEAD WITHOUT CONTRAST TECHNIQUE: Contiguous axial images were obtained from the base of the skull through the vertex without intravenous contrast. COMPARISON:  Brain MRI 09/02/2019 FINDINGS: Brain: The examination is mildly motion degraded. Again demonstrated is extensive vasogenic edema within the left cerebral hemisphere, predominantly within the left frontal, parietal and occipital lobes. Multiple intracranial metastases were better appreciated on recent prior brain MRI 09/02/2019. This includes a dominant left parietal lobe metastasis previously measured at 2.2 cm. There is no definite loss of gray-white differentiation. No evidence of intracranial hemorrhage. As before, there is mass effect with partial effacement of the left lateral ventricle. Rightward midline shift may be minimally increased from prior examination, now measuring 4 mm (previously 3 mm). No extra-axial fluid collection. Cerebral volume is normal for age. Vascular: No hyperdense vessel.  Atherosclerotic calcification. Skull: Normal. Negative for fracture or focal lesion. Sinuses/Orbits: Visualized orbits demonstrate no acute abnormality. Mild ethmoid sinus mucosal thickening. No significant mastoid effusion. IMPRESSION: Mildly motion degraded examination. Extensive vasogenic edema within the left cerebral hemisphere is similar to prior MRI 07/03/2019. Multiple intracranial metastases were better appreciated on this prior study, including a dominant 2.2 cm left parietal lobe metastasis. Redemonstrated mass effect with partial effacement of the left lateral ventricle. 4 mm rightward midline shift may be minimally increased from the prior exam. No acute intracranial hemorrhage. No  definite loss of gray-white differentiation. Electronically Signed   By: Kellie Simmering DO   On: 10/01/2019 12:44   EEG adult  Result Date: 10/01/2019 Lora Havens, Benjamin     10/01/2019  2:24 PM Patient Name: Geoffrey Benjamin MRN: [id number] Epilepsy Attending: Lora Havens Referring Physician/Provider: Dr. Amie [place] Date: 10/01/2019 Duration: 25.35 minutes Patient  history: 78 year old male with multiple brain mets, largest in left parietal region who presented with status epilepticus.  EEG to evaluate for seizures. Level of alertness: Awake, drowsy/sedated AEDs during EEG study: Keppra, phenytoin, Ativan Technical aspects: This EEG study was done with scalp electrodes positioned according to the 10-20 International system of electrode placement. Electrical activity was acquired at a sampling rate of 500Hz  and reviewed with a high frequency filter of 70Hz  and a low frequency filter of 1Hz . EEG data were recorded continuously and digitally stored. Description: At the beginning of the study, patient was noted to have right facial twitching which stopped at 1332.  Concomitant EEG showed rhythmic 2 to 5 Hz theta-delta slowing in left hemisphere.  EEG also showed continuous 2 to 6 Hz theta-delta slowing in her left hemisphere admixed with 13 to 15 Hz generalized beta activity.  Hyperventilation and photic stimulation were not performed. 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  During the seizure, patient was noted to have right facial twitching.  Seizure ended at 1332.  EEG also showed evidence of cortical dysfunction in left hemisphere likely secondary to underlying metastatic disease. The excessive beta activity seen in the background is most likely due to the effect of benzodiazepine  and is a benign EEG pattern. Dr. Rory Percy was notified and study was transitioned to long-term EEG. Lora Havens    Procedures Procedures (including critical care time) CRITICAL CARE Performed by: Charlesetta Shanks   Total critical care time: 45 minutes  Critical care time was exclusive of separately billable procedures and treating other patients.  Critical care was necessary to treat or prevent imminent or life-threatening deterioration.  Critical care was time spent personally by me on the following activities: development of treatment plan with patient and/or surrogate as well as nursing, discussions with consultants, evaluation of patient's response to treatment, examination of patient, obtaining history from patient or surrogate, ordering and performing treatments and interventions, ordering and review of laboratory studies, ordering and review of radiographic studies, pulse oximetry and re-evaluation of patient's condition. Medications Ordered in ED Medications  LORazepam (ATIVAN) injection 1 mg (0 mg Intravenous Hold 10/01/19 1319)  levETIRAcetam (KEPPRA) IVPB 1000 mg/100 mL premix (0 mg Intravenous Stopped 10/01/19 1154)  LORazepam (ATIVAN) injection 1 mg (1 mg Intravenous Given 10/01/19 1128)  0.9 %  sodium chloride infusion ( Intravenous Stopped 10/01/19 1452)  dexamethasone (DECADRON) injection 10 mg (10 mg Intravenous Given 10/01/19 1145)  levETIRAcetam (KEPPRA) IVPB 1000 mg/100 mL premix (0 mg Intravenous Stopped 10/01/19 1231)  LORazepam (ATIVAN) injection 1 mg (1 mg Intravenous Given 10/01/19 1208)  fosPHENYtoin (CEREBYX) 1,400 mg PE in sodium chloride 0.9 % 50 mL IVPB (0 mg PE/kg  70 kg (Order-Specific) Intravenous Stopped 10/01/19 1358)  sodium chloride 0.9 % bolus 1,000 mL (0 mLs Intravenous Stopped 10/01/19 1542)    ED Course  I have reviewed the triage vital signs and the nursing notes.  Pertinent labs & imaging results that were available during my care of the  patient were reviewed by me and considered in my medical decision making (see chart for details).  Clinical Course as of Sep 30 1645  Tue Oct 01, 2019  1643 Consult: Dr. Eileen Stanford excepts for internal medicine teachingservice   [MP]  1645 Patient's blood pressure has stabilized.  It is now 660 systolic.  Heart  rate is sinus rhythm in the 80s.  Patient is more alert.  He is picking at his blankets.  Still confused and sedated.  Airway is protected.   [MP]    Clinical Course User Index [MP] Charlesetta Shanks, Benjamin   MDM Rules/Calculators/A&P                     Consult: Reviewed with Dr. Malen Gauze.  Recommends to increase Keppra dose to 2000 mg total.  Add 10 mg of Decadron IV.  Continue with low-dose Ativan.  Patient will be assessed ASAP by neurology in the emergency department.  We will continue diagnostic evaluation.  Patient is wife was updated on patient's condition.  He did become quite sedated by combination of Ativan and Keppra.  Blood pressure became soft with some systolic readings in the 57S.  This did respond to fluid resuscitation.  Blood pressures have now stabilized in the 1 teens.  Patient is more alert and picking at his blankets.  He is not exhibiting ongoing seizure activity as he had been on arrival.  Patient will be admitted to medical service. Final Clinical Impression(s) / ED Diagnoses Final diagnoses:  Status epilepticus ([place])  Brain metastases [facility])    Rx / [place] Orders ED Discharge Orders    None       Charlesetta Shanks, Benjamin 10/01/19 [phone]

## 2019-10-01 NOTE — ED Notes (Signed)
pts wife given an update

## 2019-10-01 NOTE — ED Notes (Signed)
Verbal order for another fluid bolus given by Dr Johnney Killian, infusing at this time

## 2019-10-01 NOTE — H&P (Signed)
Date: 10/01/2019               Patient Name:  Geoffrey Benjamin MRN: [id number]  DOB: March 10, 1941 Age / Sex: 78 y.o., male   PCP: Heywood Bene, PA-C         Medical Service: Internal Medicine Teaching Service         Attending Physician: Dr. Daryll Drown    First Contact: Dr. Madilyn Fireman   Pager: [phone]  Second Contact: Dr. Eileen Stanford Pager: [phone]       After Hours (After 5p/  First Contact Pager: [phone]  weekends / holidays): Second Contact Pager: [phone]   Chief Complaint: Right arm twitching  History of Present Illness:   History obtained from ED provider and patient's wife due to patient's current encephalopathy.  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 Earlie Server and Dr. Marland Mcalpine at the [facility].  Also with history of type 2 diabetes mellitus, hypertension, heart failure with reduced ejection fraction ( EF 30-35% 09/2018),  and prior alcohol use disorder who presented to [facility] emergency department on October 01, 2019 following repetitive twitching and jerking like movement of his right upper extremity and right facial twitching starting at about 9:30 AM.  Per his wife, he did not experience tongue laceration, urinary or bowel incontinence however he was noted to be post ictal.  Per his wife, he was scheduled to undergo a PET scan today and followed up with his oncologist but was not able to do so.  Per her knowledge, she reports that he is full scope of care and is scheduled to continue chemotherapy and radiation therapy.   ED course: Patient received 7.5 mg of Versed on route to the ED with noticeable improvement to the twitching.  However after he got to the ED, his twitching continued and he received 1 g of Keppra IV.  Stat EEG revealed focal convulsive status  epilepticus at the left hemisphere.  CT head without contrast revealed extensive vasogenic edema within the left cerebral hemisphere, multiple intracranial metastasis, dominant 2.2 cm left parietal lobe metastasis, mass-effect with parietal effacement of the left lateral ventricle, 4 mm rightward midline shift.  In regards to his vitals, he remained afebrile, HR with range 79-1 04, RR with range 18-29, BP with range 86-125/41-68, SPO2 96% on room air.  Meds:  Current Meds  Medication Sig  . aspirin EC 81 MG tablet Take 81 mg by mouth daily.  Marland Kitchen atorvastatin (LIPITOR) 80 MG tablet Take 80 mg by mouth daily.  Marland Kitchen dexamethasone (DECADRON) 4 MG tablet Take 1 tablet (4 mg total) by mouth 2 (two) times daily.  Marland Kitchen glipiZIDE (GLUCOTROL) 5 MG tablet Take 5 mg by mouth daily before breakfast.  . levETIRAcetam (KEPPRA) 500 MG tablet Take 1 tablet (500 mg total) by mouth 2 (two) times daily.  . metFORMIN (GLUCOPHAGE) 1000 MG tablet Take 1 tablet (1,000 mg total) by mouth 2 (two) times daily with a meal.  . metoprolol tartrate (LOPRESSOR) 25 MG tablet Take 1 tablet (25 mg total) by mouth 2 (two) times daily.  . Omega-3 Fatty Acids (FISH OIL) 1200 MG CAPS Take 1,200 mg by mouth daily.  . pantoprazole (PROTONIX) 40 MG tablet Take 1 tablet (40 mg total) by mouth daily. (Patient taking differently: Take 40 mg by mouth every  evening. )     Allergies: Allergies as of 10/01/2019  . (No Known Allergies)   Past Medical History:  Diagnosis Date  . Aortic stenosis 09/13/2018  . CAP (community acquired pneumonia) 09/13/2018  . CHF (congestive heart failure) ([place])   . Difficulty walking   . DM (diabetes mellitus) ([employer])   . GERD 06/04/2009   Qualifier: Diagnosis of  By: Henrene Pastor MD, Docia Chuck   . Hyperlipidemia 09/13/2018  . Hypertension   . Loss of weight 09/29/2008   Qualifier: Diagnosis of  By: Bobby Rumpf CMA (AAMA), [place]    . Non-small cell carcinoma of right lung, stage 4 ([place])   . PERSONAL HX COLONIC POLYPS 06/04/2009    Qualifier: Diagnosis of  By: Henrene Pastor MD, [place] PAIN 09/29/2008   Qualifier: Diagnosis of  By: Bobby Rumpf CMA (AAMA), Patty    . Tachycardia   . Tobacco use 09/13/2018  . Volume overload 09/13/2018    Family History: Unable to obtain  Social History:  Per chart review, he has an extensive prior history of tobacco use disorder for 60+ years.  He lives with his wife and obtains great care from her.  Review of Systems: A complete ROS was negative except as per HPI.   Physical Exam: Blood pressure 103/62, pulse 82, temperature 98.4 F (36.9 C), temperature source Oral, resp. rate (!) 21, SpO2 97 %.  Physical Exam  Constitutional:  Minimally responsive and conversational, lethargic, somnolent  HENT:  Head: Normocephalic and atraumatic.  Eyes: Pupils are equal, round, and reactive to light. Conjunctivae are normal.  Cardiovascular: Normal rate, regular rhythm and normal heart sounds. Exam reveals no friction rub.  No murmur heard. Pulmonary/Chest: Effort normal.  Abdominal: Soft.  Musculoskeletal:        General: No edema.     Cervical back: Neck supple.  Neurological:  Cranial nerve II-XII intact with exception of the following.  Strength: Right upper extremity: 3-4/5 Right lower extremity: 3-4/5 Left upper extremity: 5/5 Left lower extremity: 5/5  Sensory: Diffusely intact.      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  He is now status post 7.5 mg of Versed, 2 g of IV Keppra, Decadron 10 mg IV x1 dose, Ativan 2 mg, fosphenytoin with noticeable improvement. CT head without contrast revealed extensive vasogenic edema  within the left cerebral hemisphere, multiple intracranial metastasis, dominant 2.2 cm left parietal lobe metastasis, mass-effect with parietal effacement of the left lateral ventricle, 4 mm rightward midline shift. -Continue Decadron 4 mg every 6 hours -Follow-up long-term EEG -MRI brain with and without contrast after long-term EEG -Seizure precaution -Appreciate neurology recommendation   #Metastatic small cell lung cancer #Brain metastasis status post stereotactic radiosurgery Follows up with Dr. Earlie Server and Dr. Lisbeth Renshaw at the [facility].  Per discussion with patient's wife [name]) and chart review, it seems that they are pursuing full scope of care with chemotherapy, immunotherapy and radiation therapy.   FEN: NS at 75 mL/hour with KCl, n.p.o. pending speech evaluation VTE ppx: Subcutaneous Lovenox CODE STATUS: Full code  Dispo: Admit patient to Inpatient with expected length of stay greater than 2  midnights.  Signed: [name], MD 10/01/2019, 7:07 PM  Pager: [phone] Internal Medicine Teaching Service

## 2019-10-01 NOTE — Telephone Encounter (Signed)
Returned patient's phone call regarding cancelling 12/31 appointment, per patient's request appointments have been cancelled. Left a voicemail to reschedule.

## 2019-10-02 ENCOUNTER — Inpatient Hospital Stay (HOSPITAL_COMMUNITY): Payer: Medicare Other

## 2019-10-02 DIAGNOSIS — G40001 Localization-related (focal) (partial) idiopathic epilepsy and epileptic syndromes with seizures of localized onset, not intractable, with status epilepticus: Secondary | ICD-10-CM

## 2019-10-02 DIAGNOSIS — Z720 Tobacco use: Secondary | ICD-10-CM

## 2019-10-02 DIAGNOSIS — C7931 Secondary malignant neoplasm of brain: Principal | ICD-10-CM

## 2019-10-02 DIAGNOSIS — R569 Unspecified convulsions: Secondary | ICD-10-CM

## 2019-10-02 DIAGNOSIS — I110 Hypertensive heart disease with heart failure: Secondary | ICD-10-CM

## 2019-10-02 DIAGNOSIS — I5020 Unspecified systolic (congestive) heart failure: Secondary | ICD-10-CM

## 2019-10-02 DIAGNOSIS — G40901 Epilepsy, unspecified, not intractable, with status epilepticus: Secondary | ICD-10-CM

## 2019-10-02 DIAGNOSIS — Z7289 Other problems related to lifestyle: Secondary | ICD-10-CM

## 2019-10-02 DIAGNOSIS — C3490 Malignant neoplasm of unspecified part of unspecified bronchus or lung: Secondary | ICD-10-CM

## 2019-10-02 DIAGNOSIS — E119 Type 2 diabetes mellitus without complications: Secondary | ICD-10-CM

## 2019-10-02 LAB — CBC
HCT: 35 % — ABNORMAL LOW (ref 39.0–52.0)
Hemoglobin: 11.7 g/dL — ABNORMAL LOW (ref 13.0–17.0)
MCH: 31 pg (ref 26.0–34.0)
MCHC: 33.4 g/dL (ref 30.0–36.0)
MCV: 92.6 fL (ref 80.0–100.0)
Platelets: 220 10*3/uL (ref 150–400)
RBC: 3.78 MIL/uL — ABNORMAL LOW (ref 4.22–5.81)
RDW: 13.9 % (ref 11.5–15.5)
WBC: 9.6 10*3/uL (ref 4.0–10.5)
nRBC: 0 % (ref 0.0–0.2)

## 2019-10-02 LAB — GLUCOSE, CAPILLARY
Glucose-Capillary: 92 mg/dL (ref 70–99)
Glucose-Capillary: 117 mg/dL — ABNORMAL HIGH (ref 70–99)

## 2019-10-02 LAB — BASIC METABOLIC PANEL
Anion gap: 13 (ref 5–15)
BUN: 15 mg/dL (ref 8–23)
CO2: 24 mmol/L (ref 22–32)
Calcium: 8.1 mg/dL — ABNORMAL LOW (ref 8.9–10.3)
Chloride: 100 mmol/L (ref 98–111)
Creatinine, Ser: 0.75 mg/dL (ref 0.61–1.24)
GFR calc Af Amer: >60 mL/min (ref >60)
GFR calc non Af Amer: >60 mL/min (ref >60)
Glucose, Bld: 187 mg/dL — ABNORMAL HIGH (ref 70–99)
Potassium: 3.6 mmol/L (ref 3.5–5.1)
Sodium: 137 mmol/L (ref 135–145)

## 2019-10-02 LAB — MRSA PCR SCREENING: MRSA by PCR: NEGATIVE

## 2019-10-02 LAB — CBG MONITORING, ED
Glucose-Capillary: 169 mg/dL — ABNORMAL HIGH (ref 70–99)
Glucose-Capillary: 155 mg/dL — ABNORMAL HIGH (ref 70–99)

## 2019-10-02 MED ORDER — GADOBUTROL 1 MMOL/ML IV SOLN
7.0000 mL | Freq: Once | INTRAVENOUS | Status: AC | PRN
  Administered 2019-10-02: 7 mL via INTRAVENOUS

## 2019-10-02 MED ORDER — LORAZEPAM 2 MG/ML IJ SOLN
1.0000 mg | Freq: Once | INTRAMUSCULAR | Status: AC
  Administered 2019-10-02: 1 mg via INTRAVENOUS

## 2019-10-02 MED ORDER — LEVETIRACETAM IN NACL 1000 MG/100ML IV SOLN
1000.0000 mg | Freq: Two times a day (BID) | INTRAVENOUS | Status: DC
  Administered 2019-10-02 – 2019-10-07 (×11): 1000 mg via INTRAVENOUS
  Filled 2019-10-02 (×13): qty 100

## 2019-10-02 MED ORDER — STROKE: EARLY STAGES OF RECOVERY BOOK
Freq: Once | Status: DC
  Filled 2019-10-02: qty 1

## 2019-10-02 NOTE — Progress Notes (Signed)
LTM discontinued; no skin breakdown was seen. 

## 2019-10-02 NOTE — Progress Notes (Signed)
Neurology Progress Note   S:// Seen and examined in the ER again.  Still waiting for a bed in the hospital. Is able to actively participate in the examination. Reports weakness in the right upper extremity.  Speech is very dysarthric.   O:// Current vital signs: BP 139/76   Pulse 93   Temp 98.4 F (36.9 C) (Oral)   Resp (!) 24   Ht '6\' 1"'$  (1.854 m)   Wt 73.3 kg   SpO2 95%   BMI 21.32 kg/m  Vital signs in last 24 hours: Temp:  [98.4 F (36.9 C)] 98.4 F (36.9 C) (12/29 1121) Pulse Rate:  [29-106] 93 (12/30 0730) Resp:  [14-32] 24 (12/30 0730) BP: (86-141)/(41-78) 139/76 (12/30 0730) SpO2:  [88 %-100 %] 95 % (12/30 0730) Weight:  [73.3 kg] 73.3 kg (12/29 2200) General: Awake, alert and oriented to self in no distress HEENT: Normocephalic, atraumatic, EEG leads in place CVS: Regular rhythm Respiratory: Nonlabored breathing Extremities: Warm well perfused Neurological exam Awake, alert oriented to self, not oriented to place or time. Is able to repeat but has difficulty naming objects and following multistep commands.  Is able to follow simple commands consistently. Speech is moderately dysarthric. Cranial nerves: Pupils equal round react light, extract movements intact, visual fields appear to be full to threat, right lower facial weakness. Motor exam: Right upper extremity is 2-3/5 and right lower extremity also is 2-3/5.  Left upper and lower extremity are full strength. Sensory exam: Intact to light touch Coordination: Difficult to perform but no gross incoordination noted.   Medications  Current Facility-Administered Medications:  .  0.9 % NaCl with KCl 40 mEq / L  infusion, , Intravenous, Continuous, Agyei, Obed K, MD, Last Rate: 75 mL/hr at 10/02/19 0421, 75 mL/hr at 10/02/19 0421 .  dexamethasone (DECADRON) injection 4 mg, 4 mg, Intravenous, Q6H, Agyei, Obed K, MD, 4 mg at 10/02/19 0422 .  enoxaparin (LOVENOX) injection 40 mg, 40 mg, Subcutaneous, Q24H, Agyei,  Obed K, MD .  insulin aspart (novoLOG) injection 0-9 Units, 0-9 Units, Subcutaneous, TID WC, Agyei, Obed K, MD .  LORazepam (ATIVAN) injection 1 mg, 1 mg, Intravenous, Once, Agyei, Caprice Kluver, MD, Stopped at 10/01/19 1319  Current Outpatient Medications:  .  aspirin EC 81 MG tablet, Take 81 mg by mouth daily., Disp: , Rfl:  .  atorvastatin (LIPITOR) 80 MG tablet, Take 80 mg by mouth daily., Disp: , Rfl:  .  dexamethasone (DECADRON) 4 MG tablet, Take 1 tablet (4 mg total) by mouth 2 (two) times daily., Disp: 60 tablet, Rfl: 0 .  glipiZIDE (GLUCOTROL) 5 MG tablet, Take 5 mg by mouth daily before breakfast., Disp: , Rfl:  .  levETIRAcetam (KEPPRA) 500 MG tablet, Take 1 tablet (500 mg total) by mouth 2 (two) times daily., Disp: 60 tablet, Rfl: 3 .  metFORMIN (GLUCOPHAGE) 1000 MG tablet, Take 1 tablet (1,000 mg total) by mouth 2 (two) times daily with a meal., Disp: 30 tablet, Rfl: 0 .  metoprolol tartrate (LOPRESSOR) 25 MG tablet, Take 1 tablet (25 mg total) by mouth 2 (two) times daily., Disp: 180 tablet, Rfl: 3 .  Omega-3 Fatty Acids (FISH OIL) 1200 MG CAPS, Take 1,200 mg by mouth daily., Disp: , Rfl:  .  pantoprazole (PROTONIX) 40 MG tablet, Take 1 tablet (40 mg total) by mouth daily. (Patient taking differently: Take 40 mg by mouth every evening. ), Disp: 30 tablet, Rfl: 11 Labs CBC    Component Value Date/Time   WBC  9.6 10/02/2019 0423   RBC 3.78 (L) 10/02/2019 0423   HGB 11.7 (L) 10/02/2019 0423   HGB 15.9 12/23/2008 0921   HCT 35.0 (L) 10/02/2019 0423   HCT 45.6 12/23/2008 0921   PLT 220 10/02/2019 0423   PLT 281 12/23/2008 0921   MCV 92.6 10/02/2019 0423   MCV 93.9 12/23/2008 0921   MCH 31.0 10/02/2019 0423   MCHC 33.4 10/02/2019 0423   RDW 13.9 10/02/2019 0423   RDW 12.9 12/23/2008 0921   LYMPHSABS 1.1 10/01/2019 1138   LYMPHSABS 1.8 12/23/2008 0921   MONOABS 0.6 10/01/2019 1138   MONOABS 0.4 12/23/2008 0921   EOSABS 0.3 10/01/2019 1138   EOSABS 0.1 12/23/2008 0921   BASOSABS  0.1 10/01/2019 1138   BASOSABS 0.1 12/23/2008 0921    CMP     Component Value Date/Time   NA 137 10/02/2019 0423   K 3.6 10/02/2019 0423   CL 100 10/02/2019 0423   CO2 24 10/02/2019 0423   GLUCOSE 187 (H) 10/02/2019 0423   BUN 15 10/02/2019 0423   CREATININE 0.75 10/02/2019 0423   CREATININE 0.89 09/17/2019 1156   CALCIUM 8.1 (L) 10/02/2019 0423   PROT 6.0 (L) 10/01/2019 1138   ALBUMIN 2.5 (L) 10/01/2019 1138   AST 16 10/01/2019 1138   ALT 32 10/01/2019 1138   ALKPHOS 68 10/01/2019 1138   BILITOT 0.7 10/01/2019 1138   GFRNONAA >60 10/02/2019 0423   GFRNONAA >60 09/17/2019 1156   GFRAA >60 10/02/2019 0423   GFRAA >60 09/17/2019 1156   Imaging I have reviewed images in epic and the results pertinent to this consultation are: CT reviewed yesterday. MRI after EEG leads are off today.  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 Is on continuous EEG for right now.  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 -- Amie Portland, MD [facility] Pager: [phone] If 7pm to 7am, please call on call as listed on AMION.

## 2019-10-02 NOTE — Procedures (Signed)
Patient Name: Geoffrey Benjamin  MRN: [id number]  Epilepsy Attending: Lora Havens  Referring Physician/Provider: Dr. Amie Portland Duration:  10/01/2019 1353 to 10/02/2019 0953  Patient history: 78 year old male with multiple brain mets, largest in left parietal region who presented with status epilepticus.  EEG to evaluate for seizures.  Level of alertness: Awake, sleep  AEDs during EEG study: Keppra, phenytoin, Ativan  Technical aspects: This EEG study was done with scalp electrodes positioned according to the 10-20 International system of electrode placement. Electrical activity was acquired at a sampling rate of 500Hz  and reviewed with a high frequency filter of 70Hz  and a low frequency filter of 1Hz . EEG data were recorded continuously and digitally stored.   Description:  During awake state, no clear posterior dominant rhythm was seen.   Sleep was characterized by vertex waves, sleep spindles (12 to 14 Hz), maximal frontocentral. EEG also showed continuous 2 to 6 Hz theta-delta slowing in her left hemisphere admixed with 13 to 15 Hz generalized beta activity.  Spikes were seen in left frontotemporal region, maximal T3, predominantly during sleep.  Abnormality -Spike, left frontotemporal, maximal T3 -Continuous slow, left hemisphere - Excessive beta, generalized  IMPRESSION: This study showed evidence of epileptogenicity in left frontotemporal region.  EEG also showed evidence of cortical dysfunction in left hemisphere likely secondary to underlying metastatic disease. The excessive beta activity seen in the background is most likely due to the effect of benzodiazepine and is a benign EEG pattern.  No definite seizures were seen during the study.  Malaiah Viramontes Barbra Sarks

## 2019-10-02 NOTE — ED Notes (Signed)
Patient transported to MRI 

## 2019-10-02 NOTE — Progress Notes (Signed)
Subjective: [name] is alert but confused on rounds today. He denies pain. He says he doesn't have any pain. We told him we would call his wife.  Consults: neuro  Objective:  Vital signs in last 24 hours: Vitals:   10/02/19 0830 10/02/19 0900 10/02/19 0930 10/02/19 1000  BP: 129/78 (!) 145/77 130/82 126/72  Pulse: 93 96 98 94  Resp: 16 (!) 27 20 (!) 21  Temp:      TempSrc:      SpO2: 94% 96% 95% 92%  Weight:      Height:       Physical Exam  Constitutional:  Chronically ill, thin appearing male  Eyes: Pupils are equal, round, and reactive to light.  Cardiovascular: Normal rate, regular rhythm and normal heart sounds.  No murmur heard. Pulmonary/Chest: Effort normal. No respiratory distress.  Abdominal: Soft.  thin  Neurological: He is alert.  Oriented to self; right side grossly weaker than left; no obvious differences in sensation   Skin: Skin is warm and dry. No erythema.  Nursing note and vitals reviewed.  I/Os:  Intake/Output Summary (Last 24 hours) at 10/02/2019 1303 Last data filed at 10/02/2019 0940 Gross per 24 hour  Intake 2150 ml  Output --  Net 2150 ml   Labs: Results for orders placed or performed during the hospital encounter of 10/01/19 (from the past 24 hour(s))  POC SARS Coronavirus 2 Ag-ED - Nasal Swab (BD Veritor Kit)     Status: None   Collection Time: 10/01/19  2:34 PM  Result Value Ref Range   SARS Coronavirus 2 Ag NEGATIVE NEGATIVE  Respiratory Panel by RT PCR (Flu A&B, Covid) - Nasopharyngeal Swab     Status: None   Collection Time: 10/01/19  4:00 PM   Specimen: Nasopharyngeal Swab  Result Value Ref Range   SARS Coronavirus 2 by RT PCR NEGATIVE NEGATIVE   Influenza A by PCR NEGATIVE NEGATIVE   Influenza B by PCR NEGATIVE NEGATIVE  CBG monitoring, ED     Status: Abnormal   Collection Time: 10/02/19  3:31 AM  Result Value Ref Range   Glucose-Capillary 169 (H) 70 - 99 mg/dL  Basic metabolic panel     Status: Abnormal   Collection  Time: 10/02/19  4:23 AM  Result Value Ref Range   Sodium 137 135 - 145 mmol/L   Potassium 3.6 3.5 - 5.1 mmol/L   Chloride 100 98 - 111 mmol/L   CO2 24 22 - 32 mmol/L   Glucose, Bld 187 (H) 70 - 99 mg/dL   BUN 15 8 - 23 mg/dL   Creatinine, Ser 0.75 0.61 - 1.24 mg/dL   Calcium 8.1 (L) 8.9 - 10.3 mg/dL   GFR calc non Af Amer >60 >60 mL/min   GFR calc Af Amer >60 >60 mL/min   Anion gap 13 5 - 15  CBC     Status: Abnormal   Collection Time: 10/02/19  4:23 AM  Result Value Ref Range   WBC 9.6 4.0 - 10.5 K/uL   RBC 3.78 (L) 4.22 - 5.81 MIL/uL   Hemoglobin 11.7 (L) 13.0 - 17.0 g/dL   HCT 35.0 (L) 39.0 - 52.0 %   MCV 92.6 80.0 - 100.0 fL   MCH 31.0 26.0 - 34.0 pg   MCHC 33.4 30.0 - 36.0 g/dL   RDW 13.9 11.5 - 15.5 %   Platelets 220 150 - 400 K/uL   nRBC 0.0 0.0 - 0.2 %  CBG monitoring, ED  Status: Abnormal   Collection Time: 10/02/19  8:43 AM  Result Value Ref Range   Glucose-Capillary 155 (H) 70 - 99 mg/dL   Imaging:  CT HEAD WITHOUT CONTRAST IMPRESSION: Mildly motion degraded examination.  Extensive vasogenic edema within the left cerebral hemisphere is similar to prior MRI 07/03/2019. Multiple intracranial metastases were better appreciated on this prior study, including a dominant 2.2 cm left parietal lobe metastasis. Redemonstrated mass effect with partial effacement of the left lateral ventricle. 4 mm rightward midline shift may be minimally increased from the prior exam.  No acute intracranial hemorrhage. No definite loss of gray-white Differentiation.  Assessment/Plan:  Assessment: Mr. Winningham is a 78 yo M w/ a hx of tobacco use (90 pack years), alcohol use, HTN, T2DM, HFrEF (EF 30-35% 09/2918) and recently diagnosed adenocarcinoma of the lung w/ brain metastases s/p SRS on 09/29/2019 on a steroid taper who presented in status epilepticus found to have extensive vasogenic edema within the left cerebral hemisphere likely rebound from decreased steroids.    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  -treated with multiple doses of benzos, Keppra and Dilantin to resolve  -neuro following and recommends continued IV dex, continued Keppra 1 g BID and MRI brain -rad onc contacted who recommends an increase in his steroids with a longer taper  Plan: -continue meds per neuro above -fu MRI results  HTN: -holding bp meds due to soft bp on admission and stable bp today  T2DM: -SSI  Dispo: Anticipated discharge pending clinical course.  Al Decant, MD 10/02/2019, 1:03 PM Pager: [phone]

## 2019-10-02 NOTE — Progress Notes (Signed)
  Date: 10/02/2019  Patient name: Geoffrey Benjamin  Medical record number: [id number]  Date of birth: 02-06-1941   I have seen and evaluated Heath Gold and discussed their care with the Residency Team. Briefly, Mr. Sullenger is a 78 year old man with adenocarcinoma of the lung with brain mets who presented with seizures.  He is now alert but confused, improved mentation.  Was on EEG when we saw him.     Vitals:   10/02/19 1430 10/02/19 1527  BP: 140/67 (!) 145/70  Pulse: 84 85  Resp: (!) 22 16  Temp:  97.8 F (36.6 C)  SpO2: 91% 95%   General: ALert, confused Eyes: Anicteric sclerae HENT: Neck is supple, dry MM CV: RR, NR, NO murmur Pulm: CTAB, no wheezing Abd: Soft, +BS MSK: Thin, loss of normal muscle tone, no contractures Skin: Ruddy appearance on neck, face arms, multiple chronic skin changes Neuro: Right upper extremity weakness which is chronic.  Otherwise, intact.  EEG leads on head  Assessment and Plan: I have seen and evaluated the patient as outlined above. I agree with the formulated Assessment and Plan as detailed in the residents' note, with the following changes:   1. Metastatic adenoCa of the lung with brain mets, seizures - Agree with longer taper of steroids - Follow neurology recommendations - Keppra, dilantin, PRN BZDs - MRI pending  Other issues per Dr. Webb Silversmith daily note.   Sid Falcon, MD 12/30/20206:49 PM

## 2019-10-02 NOTE — Progress Notes (Signed)
Pt combative and refusing to be suctioned.

## 2019-10-02 NOTE — Progress Notes (Addendum)
Interim progress note  LTM EEG with no seizure activity.  MRI brain with and without contrast reveals a punctate area of restricted diffusion indicative of acute ischemia in the left centrum semiovale.  Extensive vasogenic edema.  Mildly increased size of the left parietal metastatic lesion.   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 Continuous EEG unremarkable for underlying seizure. MRI with a punctate stroke in the left centrum semiovale-likely secondary to hypercoagulability or small vessel disease but could be cardioembolic-I do not feel it has any bearing on his current presentation with the focal status epilepticus.  We will complete the work-up though given risk factors.   Recommendations: -Continue dexamethasone -Continue Keppra  -For the punctate stroke, obtain 2D echocardiogram, lipid panel and A1c.  Do frequent neurochecks. -Size of the stroke is very small and if he requires anticoagulation from a hypercoagulability perspective, it should be started immediately.  I will follow up on the stroke risk factor work-up  -- Amie Portland, MD [facility] Pager: [phone] If 7pm to 7am, please call on call as listed on AMION.

## 2019-10-02 NOTE — ED Notes (Signed)
Patient transported to X-ray 

## 2019-10-03 ENCOUNTER — Inpatient Hospital Stay (HOSPITAL_COMMUNITY): Payer: Medicare Other

## 2019-10-03 ENCOUNTER — Ambulatory Visit: Payer: Medicare Other | Admitting: Physician Assistant

## 2019-10-03 ENCOUNTER — Other Ambulatory Visit: Payer: Medicare Other

## 2019-10-03 DIAGNOSIS — I350 Nonrheumatic aortic (valve) stenosis: Secondary | ICD-10-CM

## 2019-10-03 DIAGNOSIS — C3491 Malignant neoplasm of unspecified part of right bronchus or lung: Secondary | ICD-10-CM

## 2019-10-03 DIAGNOSIS — I639 Cerebral infarction, unspecified: Secondary | ICD-10-CM

## 2019-10-03 LAB — BASIC METABOLIC PANEL
Anion gap: 15 (ref 5–15)
BUN: 12 mg/dL (ref 8–23)
CO2: 22 mmol/L (ref 22–32)
Calcium: 8.6 mg/dL — ABNORMAL LOW (ref 8.9–10.3)
Chloride: 103 mmol/L (ref 98–111)
Creatinine, Ser: 0.79 mg/dL (ref 0.61–1.24)
GFR calc Af Amer: >60 mL/min (ref >60)
GFR calc non Af Amer: >60 mL/min (ref >60)
Glucose, Bld: 92 mg/dL (ref 70–99)
Potassium: 4.1 mmol/L (ref 3.5–5.1)
Sodium: 140 mmol/L (ref 135–145)

## 2019-10-03 LAB — LIPID PANEL
Cholesterol: 111 mg/dL (ref 0–200)
HDL: 29 mg/dL — ABNORMAL LOW (ref >40)
LDL Cholesterol: 61 mg/dL (ref 0–99)
Total CHOL/HDL Ratio: 3.8 RATIO
Triglycerides: 104 mg/dL (ref <150)
VLDL: 21 mg/dL (ref 0–40)

## 2019-10-03 LAB — GLUCOSE, CAPILLARY
Glucose-Capillary: 81 mg/dL (ref 70–99)
Glucose-Capillary: 100 mg/dL — ABNORMAL HIGH (ref 70–99)
Glucose-Capillary: 104 mg/dL — ABNORMAL HIGH (ref 70–99)
Glucose-Capillary: 96 mg/dL (ref 70–99)

## 2019-10-03 LAB — CBC
HCT: 36.6 % — ABNORMAL LOW (ref 39.0–52.0)
Hemoglobin: 12.3 g/dL — ABNORMAL LOW (ref 13.0–17.0)
MCH: 31.1 pg (ref 26.0–34.0)
MCHC: 33.6 g/dL (ref 30.0–36.0)
MCV: 92.7 fL (ref 80.0–100.0)
Platelets: 245 10*3/uL (ref 150–400)
RBC: 3.95 MIL/uL — ABNORMAL LOW (ref 4.22–5.81)
RDW: 13.9 % (ref 11.5–15.5)
WBC: 11.1 10*3/uL — ABNORMAL HIGH (ref 4.0–10.5)
nRBC: 0 % (ref 0.0–0.2)

## 2019-10-03 LAB — ECHOCARDIOGRAM COMPLETE
Height: 73 in
Weight: 2585.55 oz

## 2019-10-03 LAB — HEMOGLOBIN A1C
Hgb A1c MFr Bld: 7.3 % — ABNORMAL HIGH (ref 4.8–5.6)
Mean Plasma Glucose: 162.81 mg/dL

## 2019-10-03 MED ORDER — ATORVASTATIN CALCIUM 80 MG PO TABS
80.0000 mg | ORAL_TABLET | Freq: Every day | ORAL | Status: DC
  Administered 2019-10-03 – 2019-10-06 (×4): 80 mg via ORAL
  Filled 2019-10-03 (×4): qty 1

## 2019-10-03 MED ORDER — OXYCODONE HCL 5 MG PO TABS
5.0000 mg | ORAL_TABLET | Freq: Four times a day (QID) | ORAL | Status: DC | PRN
  Administered 2019-10-03 – 2019-10-07 (×4): 5 mg via ORAL
  Filled 2019-10-03 (×4): qty 1

## 2019-10-03 MED ORDER — APIXABAN 5 MG PO TABS
5.0000 mg | ORAL_TABLET | Freq: Two times a day (BID) | ORAL | Status: DC
  Administered 2019-10-03 – 2019-10-07 (×8): 5 mg via ORAL
  Filled 2019-10-03 (×8): qty 1

## 2019-10-03 MED ORDER — LORAZEPAM 2 MG/ML IJ SOLN: 0.5000 mg | INTRAMUSCULAR | Status: DC | PRN

## 2019-10-03 MED ORDER — ENSURE ENLIVE PO LIQD
237.0000 mL | Freq: Two times a day (BID) | ORAL | Status: DC
  Administered 2019-10-03 – 2019-10-07 (×7): 237 mL via ORAL

## 2019-10-03 MED ORDER — METOPROLOL TARTRATE 12.5 MG HALF TABLET: 12.5000 mg | ORAL_TABLET | Freq: Two times a day (BID) | ORAL | Status: DC

## 2019-10-03 MED ORDER — NICOTINE 21 MG/24HR TD PT24
21.0000 mg | MEDICATED_PATCH | Freq: Every day | TRANSDERMAL | Status: DC
  Administered 2019-10-03 – 2019-10-07 (×5): 21 mg via TRANSDERMAL
  Filled 2019-10-03 (×5): qty 1

## 2019-10-03 NOTE — Consult Note (Signed)
Consultation Note Date: 10/03/2019   Patient Name: Geoffrey Benjamin  DOB: 1941/05/10  MRN: [id number]  Age / Sex: 78 y.o., male  PCP: Heywood Bene, PA-C Referring Physician: Lucious Groves, DO  Reason for Consultation: Establishing goals of care  HPI/Patient Profile: 78 yo with newly diagnosed metastatic lung cancer s/p stereotactic brain radiation, admitted with new onset partial seizure activity, weakness and difficulty swallowing.  Clinical Assessment and Goals of Care: Mr. Hollenbach was very agitated when I saw him today. He was nervous and frustrated pulling off his O2 saturation monitor and tele leads. He is upset because he cannot get a cup of hot coffee. He asks me how long he has to live. He wants to know what is going on with him and why he is in the hospital. He wants to speak with his oncologist and gets angry when I cannot tell him whether or not he will see him today.He settled down with empathetic words and reassurance. He was not able to discuss complex goals of care with me and he had significant difficulty focusing on the conversation.  I spoke with his wife [name]. She is most worried about his cough and she is not able to care for him at home unless he is able to walk.  He has a remote ETOH history but no recent heavy drinking- she cannot tell me when his last drink was but he was not drinking at home prior to admission and she could not be more specific than that. He also was an ongoing heavy smoker.  His swallowing is a significant concern, he is unable to manage his own saliva during my visit. I discussed with SLP progression of his dysphagia. This could be his most important poor prognostic indicator.  He is now 3 weeks post radiation. He has been on a steroid taper, now with vasogenic edema on brain CT Unclear how much of this is progression of his disease vs side effect of  radiation or temporary.  SUMMARY OF RECOMMENDATIONS     Dicussed code status with his wife. We agreed to LIMITED CODE, meds only and treat mechanical airway obstruction.  Delirium/Agitation: May be steroid induced- may consider reducing his dose and continuing a slow wean as tolerated. Started him on a nicotine replacement patch to avoid w/d, prn ativan ordered-his alcohol hx is unclear.  Treat pain and other symptoms PRN  Swallowing: Continue safest texture and continue to support, the patient could not focus or make decisions with enough clarity and understanding to change existing recs. I discussed with his wife -she wants to discuss with radiation oncology-needs clarification on intended outcome.  Wife and patient are unclear on their next steps and options moving forward with managing and treating his cancer-per his wife she was told without treatment he could have 4 months to live but with treatment he could live 3-4 years or more. He has only had radiation and was supposed to fu with Mr. Earlie Server for chemotherapy options but has been hospitalized and missed  his appointments.   Functional status: He is currently requiring assistance with walking and other ADLs, provided he can meet his nutritional needs rehab may be helpful- his goal is to go home- but his wife will need him to be stronger and more independent for this to happen- difficult to anticipate his trajectory, I am concerned about his ability to maintain QOL and he has considerable frailty.  Will continue to follow and assist with goals of care and symptom management.   I have reviewed the medical record, interviewed the patient and family, and examined the patient. The following aspects are pertinent.  Past Medical History:  Diagnosis Date  . Aortic stenosis 09/13/2018  . CAP (community acquired pneumonia) 09/13/2018  . CHF (congestive heart failure) ([place])   . Difficulty walking   . DM (diabetes mellitus) ([place])   . GERD  06/04/2009   Qualifier: Diagnosis of  By: Henrene Pastor MD, Docia Chuck   . Hyperlipidemia 09/13/2018  . Hypertension   . Loss of weight 09/29/2008   Qualifier: Diagnosis of  By: Bobby Rumpf CMA (AAMA), Patty    . Non-small cell carcinoma of right lung, stage 4 ([place])   . PERSONAL HX COLONIC POLYPS 06/04/2009   Qualifier: Diagnosis of  By: Henrene Pastor MD, [place] PAIN 09/29/2008   Qualifier: Diagnosis of  By: Bobby Rumpf CMA (AAMA), Patty    . Tachycardia   . Tobacco use 09/13/2018  . Volume overload 09/13/2018   Social History   Socioeconomic History  . Marital status: Married    Spouse name: [name]  . Number of children: 3  . Years of education: Not on file  . Highest education level: 9th grade  Occupational History  . Occupation: RETIRED    Comment: Brewing technologist  Tobacco Use  . Smoking status: Current Every Day Smoker    Packs/day: 1.50    Types: Cigarettes  . Smokeless tobacco: Never Used  . Tobacco comment: SMOKED FOR 60 PLUS YEARS  Substance and Sexual Activity  . Alcohol use: Yes    Comment: occas  . Drug use: Never  . Sexual activity: Not on file  Other Topics Concern  . Not on file  Social History Narrative   Lives with wife   Caffeine-coffee 6-7 c daily   Social Determinants of Health   Financial Resource Strain:   . Difficulty of Paying Living Expenses: Not on file  Food Insecurity:   . Worried About Charity fundraiser in the Last Year: Not on file  . Ran Out of Food in the Last Year: Not on file  Transportation Needs:   . Lack of Transportation (Medical): Not on file  . Lack of Transportation (Non-Medical): Not on file  Physical Activity:   . Days of Exercise per Week: Not on file  . Minutes of Exercise per Session: Not on file  Stress:   . Feeling of Stress : Not on file  Social Connections:   . Frequency of Communication with Friends and Family: Not on file  . Frequency of Social Gatherings with Friends and Family: Not on file  . Attends Religious Services: Not on  file  . Active Member of Clubs or Organizations: Not on file  . Attends Archivist Meetings: Not on file  . Marital Status: Not on file   Family History  Problem Relation Age of Onset  . Kidney disease Father   . Stroke Mother    Scheduled Meds: .  stroke: mapping our early stages  of recovery book   Does not apply Once  . atorvastatin  80 mg Oral q1800  . dexamethasone (DECADRON) injection  4 mg Intravenous Q6H  . enoxaparin (LOVENOX) injection  40 mg Subcutaneous Q24H  . feeding supplement (ENSURE ENLIVE)  237 mL Oral BID BM  . insulin aspart  0-9 Units Subcutaneous TID WC  . LORazepam  1 mg Intravenous Once  . nicotine  21 mg Transdermal Daily   Continuous Infusions: . 0.9 % NaCl with KCl 40 mEq / L 75 mL/hr (10/03/19 1302)  . levETIRAcetam 1,000 mg (10/03/19 0908)   PRN Meds:.LORazepam, oxyCODONE Medications Prior to Admission:  Prior to Admission medications   Medication Sig Start Date End Date Taking? Authorizing Provider  aspirin EC 81 MG tablet Take 81 mg by mouth daily.   Yes [provider]  atorvastatin (LIPITOR) 80 MG tablet Take 80 mg by mouth daily. 08/26/19  Yes [provider]  dexamethasone (DECADRON) 4 MG tablet Take 1 tablet (4 mg total) by mouth 2 (two) times daily. 09/10/19  Yes Hayden Pedro, PA-C  glipiZIDE (GLUCOTROL) 5 MG tablet Take 5 mg by mouth daily before breakfast.   Yes [provider]  levETIRAcetam (KEPPRA) 500 MG tablet Take 1 tablet (500 mg total) by mouth 2 (two) times daily. 09/04/19  Yes Marianna Payment, MD  metFORMIN (GLUCOPHAGE) 1000 MG tablet Take 1 tablet (1,000 mg total) by mouth 2 (two) times daily with a meal. 09/15/18  Yes Irene Pap N, DO  metoprolol tartrate (LOPRESSOR) 25 MG tablet Take 1 tablet (25 mg total) by mouth 2 (two) times daily. 10/12/18  Yes Josue Hector, MD  Omega-3 Fatty Acids (FISH OIL) 1200 MG CAPS Take 1,200 mg by mouth daily.   Yes [provider]  pantoprazole  (PROTONIX) 40 MG tablet Take 1 tablet (40 mg total) by mouth daily. Patient taking differently: Take 40 mg by mouth every evening.  10/16/18  Yes Josue Hector, MD   No Known Allergies Review of Systems  Physical Exam  Vital Signs: BP 129/72 (BP Location: Left Arm)   Pulse 86   Temp 98.8 F (37.1 C)   Resp 18   Ht 6\' 1"  (1.854 m)   Wt 73.3 kg   SpO2 91%   BMI 21.32 kg/m  Pain Scale: 0-10   Pain Score: Asleep   SpO2: SpO2: 91 % O2 Device:SpO2: 91 % O2 Flow Rate: .O2 Flow Rate (L/min): 3.5 L/min  IO: Intake/output summary:   Intake/Output Summary (Last 24 hours) at 10/03/2019 1645 Last data filed at 10/03/2019 0015 Gross per 24 hour  Intake -  Output 750 ml  Net -750 ml    LBM: Last BM Date: (not sure when last bm) Baseline Weight: Weight: 73.3 kg Most recent weight: Weight: 73.3 kg     Palliative Assessment/Data:      Time Total: 70 min Greater than 50%  of this time was spent counseling and coordinating care related to the above assessment and plan.  Signed by: Lane Hacker, DO   Please contact Palliative Medicine Team phone at [phone] for questions and concerns.  For individual provider: See Shea Evans

## 2019-10-03 NOTE — Progress Notes (Addendum)
Rehab Admissions Coordinator Note:  Per PT, SLP, and OT, this patient was screened by Raechel Ache for appropriateness for an Inpatient Acute Rehab Consult.  At this time, we are recommending Inpatient Rehab consult. AC will contact MD to request order.   Raechel Ache 10/03/2019, 2:03 PM  I can be reached at [phone].

## 2019-10-03 NOTE — Evaluation (Signed)
Speech Language Pathology Evaluation Patient Details Name: Geoffrey Benjamin MRN: [id number] DOB: 1941-07-31 Today's Date: 10/03/2019 Time: 1950-9326 SLP Time Calculation (min) (ACUTE ONLY): 20 min  Problem List:  Patient Active Problem List   Diagnosis Date Noted  . Focal and partial seizures ([place]) 10/01/2019  . Status epilepticus ([place]) 10/01/2019  . Adenocarcinoma of right lung, stage 4 ([place]) 09/05/2019  . Goals of care, counseling/discussion 09/05/2019  . Brain metastases ([place]) 09/02/2019  . Right sided weakness 09/01/2019  . Acute systolic congestive heart failure ([place]) 09/19/2018  . CAP (community acquired pneumonia) 09/13/2018  . Hyperlipidemia 09/13/2018  . Volume overload 09/13/2018  . Tobacco use 09/13/2018  . Aortic stenosis 09/13/2018  . Chronic pain 11/10/2015  . Hypertension 11/10/2015  . Tobacco dependence 11/10/2015  . GERD 06/04/2009  . PERSONAL HX COLONIC POLYPS 06/04/2009  . LOSS OF WEIGHT 09/29/2008  . RLQ PAIN 09/29/2008  . Type 2 diabetes mellitus ([place]) 09/22/2008   Past Medical History:  Past Medical History:  Diagnosis Date  . Aortic stenosis 09/13/2018  . CAP (community acquired pneumonia) 09/13/2018  . CHF (congestive heart failure) ([place])   . Difficulty walking   . DM (diabetes mellitus) ([place])   . GERD 06/04/2009   Qualifier: Diagnosis of  By: Henrene Pastor MD, Docia Chuck   . Hyperlipidemia 09/13/2018  . Hypertension   . Loss of weight 09/29/2008   Qualifier: Diagnosis of  By: Bobby Rumpf CMA (AAMA), Patty    . Non-small cell carcinoma of right lung, stage 4 ([place])   . PERSONAL HX COLONIC POLYPS 06/04/2009   Qualifier: Diagnosis of  By: Henrene Pastor MD, [place] PAIN 09/29/2008   Qualifier: Diagnosis of  By: Bobby Rumpf CMA (AAMA), Patty    . Tachycardia   . Tobacco use 09/13/2018  . Volume overload 09/13/2018   Past Surgical History:  Past Surgical History:  Procedure Laterality Date  . CATARACT EXTRACTION, BILATERAL  08/2016  . DOPPLER ECHOCARDIOGRAPHY     Mitral  inflow and tissue doppler consistent with impaired LV relaxation; Trileaflet aortic vlave with moderate aortic valve stenosis, Trivial mitral and tricuspid valve regurgitation.   HPI:  78 y.o. 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 MRI: punctate stroke left centrum semiovale.  Mets in left parietal lobe with surrounding vasogenic edema.    Assessment / Plan / Recommendation Clinical Impression  Pt presents with dysarthria of speech, impacted by errors in articulatory precision and hypernasal resonance, and affecting intelligibility.  He follows commands; output is fluent with errors in higher-level naming.  He demonstrates reduced awareness on right and reduced oral sensory awareness.  Pt verbalized his frustration and stated "all the doctors are telling me different things." Provided active listening and encouragement.  Depending on goals of care, and if plan is to return home, he may benefit from short CIR stay to help decrease the burden of care on family, facilitate communication to meet his needs.      SLP Assessment  SLP Recommendation/Assessment: Patient needs continued Speech Lanaguage Pathology Services SLP Visit Diagnosis: Dysphagia, unspecified (R13.10)    Follow  Up Recommendations  Inpatient Rehab    Frequency and Duration min 2x/week  1 week      SLP Evaluation Cognition  Overall Cognitive Status: Impaired/Different from baseline Arousal/Alertness: Awake/alert Orientation  Level: Oriented to person;Oriented to place;Oriented to time Attention: Sustained Sustained Attention: Appears intact Safety/Judgment: Impaired       Comprehension  Auditory Comprehension Overall Auditory Comprehension: Appears within functional limits for tasks assessed Yes/No Questions: Within Functional Limits Commands: Within Functional Limits Conversation: Simple Visual Recognition/Discrimination Discrimination: Not tested Reading Comprehension Reading Status: Not tested    Expression Expression Primary Mode of Expression: Verbal Verbal Expression Overall Verbal Expression: Impaired Initiation: No impairment Level of Generative/Spontaneous Verbalization: Conversation Repetition: No impairment Naming: Impairment Responsive: 51-75% accurate Confrontation: Within functional limits Divergent: 25-49% accurate Pragmatics: No impairment Written Expression Written Expression: Not tested   Oral / Motor  Oral Motor/Sensory Function Overall Oral Motor/Sensory Function: Mild impairment Facial ROM: Reduced right Facial Symmetry: Abnormal symmetry right;Suspected CN VII (facial) dysfunction Facial Sensation: Reduced right;Suspected CN V (Trigeminal) dysfunction Lingual ROM: Reduced right;Reduced left Lingual Symmetry: (poor extension) Lingual Strength: Reduced Velum: Suspected CN X (Vagus) dysfunction Mandible: Within Functional Limits Motor Speech Overall Motor Speech: Impaired Respiration: Impaired Level of Impairment: Conversation Phonation: Normal Resonance: Hypernasality Articulation: Impaired Intelligibility: Intelligibility reduced Phrase: 75-100% accurate Sentence: 75-100% accurate Motor Planning: Witnin functional limits   GO                    [name] 10/03/2019, 1:01 PM  Julietta Batterman L. Tivis Ringer, [place] Office number [phone] Pager [phone]

## 2019-10-03 NOTE — Progress Notes (Signed)
  Echocardiogram 2D Echocardiogram has been performed.  Jannett Celestine 10/03/2019, 10:55 AM

## 2019-10-03 NOTE — Evaluation (Signed)
Occupational Therapy Evaluation Patient Details Name: Geoffrey Benjamin MRN: [id number] DOB: April 23, 1941 Today's Date: 10/03/2019    History of Present Illness 78 yo male admitted with focal satus epilepticus with Rt UE and Rt LE twitching.  MRI showed punctate stroke Lt centrum semiovale as well as slightly increased size of Lt parietal lobe met with unchanged surrounding vasogenic edema and unchanged subcentimeter metastases elsewhere.  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  HTN, DM, CHF, aortic stenosis.   Clinical Impression   Pt admitted with above. He demonstrates the below listed deficits and will benefit from continued OT to maximize safety and independence with BADLs.  Pt presents to OT with Rt hemiparesis, impaired communication and cognition, as well as impaired balance.  He currently requires min guard assist - max A for ADLs.   He lives at home with his wife, and apparently was using a RW prior to admission.  Depending of [place] discussion, feel that he may benefit from a short CIR stay to allow him to maximize safety and independence with ADLs and functional mobility to reduce burden of care.  Will follow acutely.       Follow Up Recommendations  CIR;Supervision/Assistance - 24 hour    Equipment Recommendations  3 in 1 bedside commode;Tub/shower bench    Recommendations for Other Services Rehab consult     Precautions / Restrictions Precautions Precautions: Fall Precaution Comments: R inattention      Mobility Bed Mobility Overal bed mobility: Needs Assistance Bed Mobility: Supine to Sit     Supine to sit: Mod assist     General bed mobility comments: increased time, assist to initiate with R LE and to lift trunk, scoot to EOB with IV/condom cath and monitor leads  Transfers Overall transfer level: Needs assistance Equipment used: Rolling walker (2  wheeled) Transfers: Sit to/from Stand Sit to Stand: Min assist;+2 safety/equipment Stand pivot transfers: Min assist       General transfer comment: increased time, assist for anterior weight shift and balance and R side awareness    Balance Overall balance assessment: Needs assistance Sitting-balance support: Feet supported Sitting balance-Leahy Scale: Fair Sitting balance - Comments: able to maintain static sitting with min guard assist   Standing balance support: Bilateral upper extremity supported Standing balance-Leahy Scale: Poor Standing balance comment: Pt requires min A and bil. UE support                            ADL either performed or assessed with clinical judgement   ADL Overall ADL's : Needs assistance/impaired Eating/Feeding: NPO   Grooming: Wash/dry hands;Wash/dry face;Oral care;Brushing hair;Set up;Sitting   Upper Body Bathing: Moderate assistance;Sitting   Lower Body Bathing: Moderate assistance;Sit to/from stand   Upper Body Dressing : Maximal assistance;Sitting   Lower Body Dressing: Maximal assistance;Sit to/from stand   Toilet Transfer: Minimal assistance;+2 for safety/equipment;Stand-pivot;BSC;Requires wide/bariatric   Toileting- Clothing Manipulation and Hygiene: Moderate assistance;Sit to/from stand       Functional mobility during ADLs: Minimal assistance;+2 for safety/equipment;Cueing for safety;Rolling walker       Vision Baseline Vision/History: Wears glasses Additional Comments: Pt does not have glasses present with him.  Attempted to perform visual assessment, however, pt unable to follow commands adequately to complete formal visual assessment.  He was unable to accurately read the clock on the wall     Perception  Perception Perception Tested?: Yes   Praxis      Pertinent Vitals/Pain Pain Assessment: No/denies pain     Hand Dominance Left   Extremity/Trunk Assessment Upper Extremity Assessment Upper Extremity  Assessment: Defer to OT evaluation RUE Deficits / Details: Pt demonstrates shoulder flexion to ~60* with increased time and considerable effort.  he is able to peform ~80% hand to mouth, and demonstrates gross grasp/release  RUE Coordination: decreased fine motor;decreased gross motor   Lower Extremity Assessment Lower Extremity Assessment: RLE deficits/detail RLE Deficits / Details: lifts antigravity with extra time and effort, limited with function needing assist for L weight shift to progress R LE       Communication Communication Communication: Expressive difficulties(dysarthria, word finding issues)   Cognition Arousal/Alertness: Awake/alert Behavior During Therapy: Impulsive;WFL for tasks assessed/performed Overall Cognitive Status: Impaired/Different from baseline Area of Impairment: Attention;Memory;Safety/judgement;Problem solving;Awareness;Following commands                   Current Attention Level: Sustained;Selective Memory: Decreased short-term memory Following Commands: Follows one step commands consistently Safety/Judgement: Decreased awareness of deficits;Decreased awareness of safety Awareness: Intellectual Problem Solving: Slow processing;Difficulty sequencing;Requires verbal cues;Requires tactile cues General Comments: uncertain how much of above deficits are new from current seizures or have been present since prior admission - no family present and pt unable to provide this info    General Comments  VSS remained stable     Exercises     Shoulder Instructions      Home Living Family/patient expects to be discharged to:: Private residence Living Arrangements: Spouse/significant other Available Help at Discharge: Family;Available 24 hours/day Type of Home: House Home Access: Stairs to enter [employer] of Steps: 4 Entrance Stairs-Rails: Right Home Layout: One level     Bathroom Shower/Tub: Tub/shower unit;Curtain   Biochemist, clinical:  Standard     Home Equipment: Environmental consultant - 2 wheels      Lives With: Spouse    Prior Functioning/Environment Level of [place]: Needs assistance  Gait / Transfers Assistance Needed: pt indicates he was having some difficulty with walking due to Lt LE weakness.  He initially indicated he didn't have a RW, but later confirmed that he did have one  ADL's / Homemaking Assistance Needed: pt unable to provide clear info re: levl of assist with ADLs             OT Problem List: Decreased strength;Decreased range of motion;Decreased activity tolerance;Impaired balance (sitting and/or standing);Decreased coordination;Impaired vision/perception;Decreased cognition;Decreased safety awareness;Decreased knowledge of use of DME or AE;Impaired UE functional use;Impaired tone      OT Treatment/Interventions: Self-care/ADL training;Neuromuscular education;DME and/or AE instruction;Therapeutic activities;Cognitive remediation/compensation;Visual/perceptual remediation/compensation;Patient/family education;Balance training    OT Goals(Current goals can be found in the care plan section) Acute Rehab OT Goals Patient Stated Goal: to have a cup of coffee  OT Goal Formulation: With patient Time For Goal Achievement: 10/17/19 Potential to Achieve Goals: Good ADL Goals Pt Will Perform Grooming: with min guard assist;standing Pt Will Perform Upper Body Bathing: sitting;with supervision Pt Will Perform Lower Body Bathing: with min guard assist;sit to/from stand Pt Will Transfer to Toilet: with min guard assist;ambulating;regular height toilet;bedside commode;grab bars Pt Will Perform Toileting - Clothing Manipulation and hygiene: with min guard assist;sit to/from stand Pt/caregiver will Perform Home Exercise Program: Increased strength;Increased ROM;With written HEP provided;With minimal assist  OT Frequency: Min 2X/week   Barriers to D/C:            Co-evaluation PT/OT/SLP Co-Evaluation/Treatment:  Yes Reason for  Co-Treatment: For patient/therapist safety;Necessary to address cognition/behavior during functional activity PT goals addressed during session: Mobility/safety with mobility;Balance;Proper use of DME OT goals addressed during session: ADL's and self-care      AM-PAC OT "6 Clicks" Daily Activity     Outcome Measure Help from another person eating meals?: Total Help from another person taking care of personal grooming?: A Little Help from another person toileting, which includes using toliet, bedpan, or urinal?: A Lot Help from another person bathing (including washing, rinsing, drying)?: A Lot Help from another person to put on and taking off regular upper body clothing?: A Lot Help from another person to put on and taking off regular lower body clothing?: A Lot 6 Click Score: 12   End of Session Equipment Utilized During Treatment: Gait belt;Rolling walker Nurse Communication: Mobility status  Activity Tolerance: Patient tolerated treatment well Patient left: in chair;with call bell/phone within reach;with chair alarm set;Other (comment)(SLP in room )  OT Visit Diagnosis: Unsteadiness on feet (R26.81);Cognitive communication deficit (R41.841) Symptoms and signs involving cognitive functions: Cerebral infarction                Time: 8325-4982 OT Time Calculation (min): 33 min Charges:  OT General Charges $OT Visit: 1 Visit OT Evaluation $OT Eval Moderate Complexity: 1 Mod  Nilsa Nutting., OTR/L Acute Rehabilitation Services Pager [phone] Office [phone]   Lucille Passy M 10/03/2019, 1:37 PM

## 2019-10-03 NOTE — Progress Notes (Addendum)
Subjective: Patient somewhat confused today. States he feels great. When asked if he has pain he says yes. He is unable to locate but when asked about pain in specific locations he said yes to every location including abdomen, back and extremities.   Consults: neuro  Objective:  Vital signs in last 24 hours: Vitals:   10/02/19 1900 10/02/19 2059 10/03/19 0817 10/03/19 1144  BP:  131/75 (!) 145/69 132/76  Pulse: 87 91  100  Resp: 19   (!) 23  Temp:  97.9 F (36.6 C) 99.1 F (37.3 C) 98.6 F (37 C)  TempSrc:  Oral Oral Oral  SpO2: 93% 93%  98%  Weight:      Height:       Physical Exam  Constitutional:  Chronically ill, thin appearing male  Eyes: Pupils are equal, round, and reactive to light.  Cardiovascular: Normal rate, regular rhythm and normal heart sounds.  No murmur heard. Pulmonary/Chest: Effort normal. No respiratory distress.  Abdominal: Soft.  thin  Neurological: He is alert.  Oriented to self; right side grossly weaker than left; no obvious differences in sensation   Skin: Skin is warm and dry. No erythema.  Nursing note and vitals reviewed.  I/Os:  Intake/Output Summary (Last 24 hours) at 10/03/2019 1214 Last data filed at 10/03/2019 0015 Gross per 24 hour  Intake -  Output 750 ml  Net -750 ml   Labs: Results for orders placed or performed during the hospital encounter of 10/01/19 (from the past 24 hour(s))  MRSA PCR Screening     Status: None   Collection Time: 10/02/19  3:34 PM   Specimen: Nasal Mucosa; Nasopharyngeal  Result Value Ref Range   MRSA by PCR NEGATIVE NEGATIVE  Glucose, capillary     Status: None   Collection Time: 10/02/19  4:11 PM  Result Value Ref Range   Glucose-Capillary 92 70 - 99 mg/dL  Glucose, capillary     Status: Abnormal   Collection Time: 10/02/19  9:27 PM  Result Value Ref Range   Glucose-Capillary 117 (H) 70 - 99 mg/dL  Basic metabolic panel     Status: Abnormal   Collection Time: 10/03/19  3:36 AM  Result Value  Ref Range   Sodium 140 135 - 145 mmol/L   Potassium 4.1 3.5 - 5.1 mmol/L   Chloride 103 98 - 111 mmol/L   CO2 22 22 - 32 mmol/L   Glucose, Bld 92 70 - 99 mg/dL   BUN 12 8 - 23 mg/dL   Creatinine, Ser 0.79 0.61 - 1.24 mg/dL   Calcium 8.6 (L) 8.9 - 10.3 mg/dL   GFR calc non Af Amer >60 >60 mL/min   GFR calc Af Amer >60 >60 mL/min   Anion gap 15 5 - 15  CBC     Status: Abnormal   Collection Time: 10/03/19  3:36 AM  Result Value Ref Range   WBC 11.1 (H) 4.0 - 10.5 K/uL   RBC 3.95 (L) 4.22 - 5.81 MIL/uL   Hemoglobin 12.3 (L) 13.0 - 17.0 g/dL   HCT 36.6 (L) 39.0 - 52.0 %   MCV 92.7 80.0 - 100.0 fL   MCH 31.1 26.0 - 34.0 pg   MCHC 33.6 30.0 - 36.0 g/dL   RDW 13.9 11.5 - 15.5 %   Platelets 245 150 - 400 K/uL   nRBC 0.0 0.0 - 0.2 %  Hemoglobin A1c     Status: Abnormal   Collection Time: 10/03/19  3:36 AM  Result  Value Ref Range   Hgb A1c MFr Bld 7.3 (H) 4.8 - 5.6 %   Mean Plasma Glucose 162.81 mg/dL  Lipid panel     Status: Abnormal   Collection Time: 10/03/19  3:36 AM  Result Value Ref Range   Cholesterol 111 0 - 200 mg/dL   Triglycerides 104 <150 mg/dL   HDL 29 (L) >40 mg/dL   Total CHOL/HDL Ratio 3.8 RATIO   VLDL 21 0 - 40 mg/dL   LDL Cholesterol 61 0 - 99 mg/dL  Glucose, capillary     Status: None   Collection Time: 10/03/19  8:18 AM  Result Value Ref Range   Glucose-Capillary 81 70 - 99 mg/dL  Glucose, capillary     Status: Abnormal   Collection Time: 10/03/19 11:56 AM  Result Value Ref Range   Glucose-Capillary 100 (H) 70 - 99 mg/dL   Imaging:  MRI HEAD WITHOUT AND WITH CONTRAST IMPRESSION: 1. 4 mm acute infarct in the left centrum semiovale. 2. Slightly increased size of left parietal metastasis with unchanged extensive vasogenic edema and mass effect. 3. Unchanged subcentimeter metastases elsewhere. 4. No new metastases identified within limitations of motion artifact.  Assessment/Plan:  Assessment: [name] is a 78 yo M w/ a hx of tobacco use (90 pack  years), alcohol use, HTN, T2DM, HFrEF (EF 30-35% 09/2918) and recently diagnosed adenocarcinoma of the lung w/ brain metastases s/p SRS on 09/29/2019 on a steroid taper who presented in breakthrough status epilepticus found to have extensive vasogenic edema within the left cerebral hemisphere likely rebound from steroid taper.   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 without significant change in known metastases  -Neuro recommending stroke work up: Hgb A1c of 7.3 and lipid panel with total cholesterol of 111, triglycerides of 104, low HDL of 29 and elevated LDL of 61 -elevated Hgb A1c likely due to steroid use of the last month which will require stricter management outpatient -patient on atorvastatin 80 mg daily outpatient -patient complaining of diffuse body pain today -speech says patient's swallowing function has worsened since last month's admission -PT/OT consulted -rad onc contacted who recommends an increase in his steroids with a longer taper on dc  Plan: -continue steroids and seizure meds per neuro above -stricter DM control outpatient given steroid use -restart home atorvastatin 80 daily -Echo today for stroke work up, fu results -starting oxy 5 q6 prn for pain -PT/OT consulted for dispo purposes -palliative consulted given disease burden and decreased functional status   HTN: -home bp meds held due to soft bo on admission -bp stable  to slightly elevated now  Plan: -continue to hold BP meds given possible acute infarct and defer to neuro recs  T2DM: -SSI  Plan: -close outpatient fu given increased A1c in setting of steroid use   Dispo: Anticipated discharge pending clinical course.  Al Decant, MD 10/03/2019, 12:14 PM Pager: [phone]

## 2019-10-03 NOTE — Progress Notes (Signed)
Initial Nutrition Assessment  INTERVENTION:   -Will monitor for plan going forward -Recommend Ensure Enlive po BID, each supplement provides 350 kcal and 20 grams of protein if diet is advanced  NUTRITION DIAGNOSIS:   Increased nutrient needs related to cancer and cancer related treatments as evidenced by estimated needs.  GOAL:   Patient will meet greater than or equal to 90% of their needs  MONITOR:   Diet advancement, Labs, Weight trends, I & O's  REASON FOR ASSESSMENT:   Malnutrition Screening Tool    ASSESSMENT:   78 year old man with adenocarcinoma of the lung with brain mets who presented with seizures.  **RD working remotely**  Patient currently NPO. Alert/oriented x 2.  Per chart review, pt was recently diagnosed with stage 4 lung cancer with brain mets. Pt was planning to start treatments soon.  SLP has been consulted for swallow evaluation. Pt undergoing stroke work-up.   Per weight records, pt has lost 12 lbs since March 2020 (6% wt loss x 9 months, insignificant for time frame).   I/Os: +1.5L since admit UOP: 750 ml x 24 hrs  Medications reviewed.  Labs reviewed: CBGs: 81-100  NUTRITION - FOCUSED PHYSICAL EXAM:  Working remotely.  Diet Order:   Diet Order            Diet NPO time specified  Diet effective now              EDUCATION NEEDS:   No education needs have been identified at this time  Skin:  Skin Assessment: Reviewed RN Assessment  Last BM:  PTA  Height:   Ht Readings from Last 1 Encounters:  10/01/19 6\' 1"  (1.854 m)    Weight:   Wt Readings from Last 1 Encounters:  10/01/19 73.3 kg    Ideal Body Weight:  83.6 kg  BMI:  Body mass index is 21.32 kg/m.  Estimated Nutritional Needs:   Kcal:  2100-2300  Protein:  100-115g  Fluid:  2L/day   Clayton Bibles, MS, RD, LDN Inpatient Clinical Dietitian Pager: [phone] After Hours Pager: [phone]

## 2019-10-03 NOTE — Evaluation (Signed)
Clinical/Bedside Swallow Evaluation Patient Details  Name: Geoffrey Benjamin MRN: [id number] Date of Birth: 06-02-1941  Today's Date: 10/03/2019 Time: SLP Start Time (ACUTE ONLY): 1106 SLP Stop Time (ACUTE ONLY): 1126 SLP Time Calculation (min) (ACUTE ONLY): 20 min  Past Medical History:  Past Medical History:  Diagnosis Date  . Aortic stenosis 09/13/2018  . CAP (community acquired pneumonia) 09/13/2018  . CHF (congestive heart failure) ([place])   . Difficulty walking   . DM (diabetes mellitus) ([place])   . GERD 06/04/2009   Qualifier: Diagnosis of  By: Henrene Pastor MD, Docia Chuck   . Hyperlipidemia 09/13/2018  . Hypertension   . Loss of weight 09/29/2008   Qualifier: Diagnosis of  By: Bobby Rumpf CMA (AAMA), [place]    . Non-small cell carcinoma of right lung, stage 4 ([place])   . PERSONAL HX COLONIC POLYPS 06/04/2009   Qualifier: Diagnosis of  By: Henrene Pastor MD, [place] PAIN 09/29/2008   Qualifier: Diagnosis of  By: Bobby Rumpf CMA (AAMA), Patty    . Tachycardia   . Tobacco use 09/13/2018  . Volume overload 09/13/2018   Past Surgical History:  Past Surgical History:  Procedure Laterality Date  . CATARACT EXTRACTION, BILATERAL  08/2016  . DOPPLER ECHOCARDIOGRAPHY     Mitral inflow and tissue doppler consistent with impaired LV relaxation; Trileaflet aortic vlave with moderate aortic valve stenosis, Trivial mitral and tricuspid valve regurgitation.   HPI:  78 y.o. 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 MRI:  punctate stroke left centrum semiovale.  Mets in left parietal lobe with surrounding vasogenic edema.    Assessment / Plan / Recommendation Clinical Impression  Pt presents with deteriorated swallow function since his evaluation 09/02/19, at which time a regular diet/thin liquids was recommended.  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  During PO trials, he initially tolerated POs well,  but as he continued to eat, there was notable residue and spillage from right side of mouth, to the point that liquids were streaming from R oral cavity with no awareness.  He began coughing unremittingly.  Pt stated that he has been coughing with foods/liquids "for a while now." He expressed frustration that he has not eaten since admission, and politely asked for a cup of black coffee.   Dysphagia and risk of aspiration is certainly present.  Recommend beginning a dysphagia 3 diet for now with thin liquids; give meds whole in puree.  Palliative care consult, fortunately, is pending - GOC discussion will allow pt to decide whether instrumental swallow study is worth pursuing, and whether modifying his diet or potentially restricting his liquids is aligned with his goals. SLP will follow for plan.  SLP Visit Diagnosis: Dysphagia, unspecified (R13.10)           Diet Recommendation   dysphagia 3, thin liquids  Medication Administration: Whole meds with puree    Other  Recommendations Oral Care  Recommendations: Oral care BID   Follow up Recommendations Inpatient Rehab      Frequency and Duration min 2x/week  1 week       Prognosis        Swallow Study   General HPI: 78 y.o. 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 MRI: punctate stroke left centrum semiovale.  Mets in left parietal lobe with surrounding vasogenic edema.  Type of Study: Bedside Swallow Evaluation Previous Swallow Assessment: 09/02/19 Diet Prior to this Study: NPO Temperature Spikes Noted: No Respiratory Status: Nasal cannula History of Recent Intubation: No Behavior/Cognition: Alert;Cooperative;Confused Oral Cavity Assessment: Dried secretions Oral Care Completed by SLP: Yes Oral Cavity - Dentition: Missing dentition Vision: Functional for self-feeding Self-Feeding Abilities: Able to feed self Patient Positioning: Upright in chair Baseline Vocal Quality: Normal Volitional Cough: Strong Volitional Swallow: Able to elicit    Oral/Motor/Sensory Function Overall Oral Motor/Sensory Function: Mild impairment Facial ROM: Reduced right Facial Symmetry: Abnormal symmetry right;Suspected CN VII (facial) dysfunction Facial Sensation: Reduced right;Suspected CN V (Trigeminal) dysfunction Lingual ROM: Reduced right;Reduced left Lingual Symmetry: (poor extension) Lingual Strength: Reduced Velum: Suspected CN X (Vagus) dysfunction Mandible: Within Functional Limits   Ice Chips Ice chips: Within functional limits   Thin Liquid Thin Liquid: Impaired Oral Phase Functional Implications: Right anterior spillage Pharyngeal  Phase Impairments: Cough - Immediate    Nectar Thick Nectar Thick Liquid: Not tested   Honey Thick Honey Thick Liquid: Not tested   Puree Puree: Impaired Presentation: Self Fed;Spoon Oral Phase  Impairments: Poor awareness of bolus Oral Phase Functional Implications: Right anterior spillage;Right lateral sulci pocketing   Solid     Solid: Impaired Presentation: Self Fed Oral Phase Impairments: Poor awareness of bolus Oral Phase Functional Implications: Right anterior spillage;Right lateral sulci pocketing      Cayleigh Paull Laurice 10/03/2019,12:50 PM Charvis Lightner L. Tivis Ringer, [place] Office number [phone] Pager [phone]

## 2019-10-03 NOTE — Evaluation (Signed)
Physical Therapy Evaluation Patient Details Name: Geoffrey Benjamin MRN: [id number] DOB: 22-Mar-1941 Today's Date: 10/03/2019   History of Present Illness  78 yo male admitted with focal satus epilepticus with Rt UE and Rt LE twitching.  MRI showed punctate stroke Lt centrum semiovale as well as slightly increased size of Lt parietal lobe met with unchanged surrounding vasogenic edema and unchanged subcentimeter metastases elsewhere.  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  HTN, DM, CHF, aortic stenosis.  Clinical Impression  Patient presents with decreased mobility due to R side weakness, inattention, decreased balance, decreased deficit awareness and high risk for falls.  Currently min to mod A for mobility.  Feel he may benefit from CIR level rehab for decreased burden of care to allow d/c home with wife support.  PT to follow acutely.     Follow Up Recommendations CIR    Equipment Recommendations  Wheelchair cushion (measurements PT);Wheelchair (measurements PT)    Recommendations for Other Services Rehab consult     Precautions / Restrictions Precautions Precautions: Fall Precaution Comments: R inattention      Mobility  Bed Mobility Overal bed mobility: Needs Assistance Bed Mobility: Supine to Sit     Supine to sit: Mod assist     General bed mobility comments: increased time, assist to initiate with R LE and to lift trunk, scoot to EOB with IV/condom cath and monitor leads  Transfers Overall transfer level: Needs assistance Equipment used: Rolling walker (2 wheeled) Transfers: Sit to/from Stand Sit to Stand: Min assist;+2 safety/equipment Stand pivot transfers: Min assist       General transfer comment: increased time, assist for anterior weight shift and balance and R side awareness  Ambulation/Gait Ambulation/Gait assistance: Min assist;+2  safety/equipment Gait Distance (Feet): 8 Feet Assistive device: Rolling walker (2 wheeled) Gait Pattern/deviations: Decreased stride length;Step-to pattern;Decreased step length - right;Decreased dorsiflexion - right;Shuffle     General Gait Details: some facilitation for L lateral weight shift to improve R LE progression  Stairs            Wheelchair Mobility    Modified Rankin (Stroke Patients Only) Modified Rankin (Stroke Patients Only) Pre-Morbid Rankin Score: Moderate disability Modified Rankin: Moderately severe disability     Balance Overall balance assessment: Needs assistance Sitting-balance support: Feet supported Sitting balance-Leahy Scale: Fair Sitting balance - Comments: able to maintain static sitting with supervision after initial assist   Standing balance support: Bilateral upper extremity supported Standing balance-Leahy Scale: Poor Standing balance comment: Pt requires min A and bil. UE support                              Pertinent Vitals/Pain Pain Assessment: No/denies pain    Home Living Family/patient expects to be discharged to:: Private residence Living Arrangements: Spouse/significant other Available Help at Discharge: Family;Available 24 hours/day Type of Home: House Home Access: Stairs to enter Entrance Stairs-Rails: Right Entrance Stairs-Number of Steps: 4 Home Layout: One level Home Equipment: Walker - 2 wheels      Prior Function Level of Independence: Needs assistance   Gait / Transfers Assistance Needed: pt indicates he was having some difficulty with walking due to Lt LE weakness.  He initially indicated he didn't have a RW, but later confirmed that he did have one   ADL's / 25 Assistance Needed: pt unable to provide clear info  re: levl of assist with ADLs         Hand Dominance   Dominant Hand: Left    Extremity/Trunk Assessment   Upper Extremity Assessment Upper Extremity Assessment: Defer to OT  evaluation RUE Deficits / Details: Pt demonstrates shoulder flexion to ~60* with increased time and considerable effort.  he is able to peform ~80% hand to mouth, and demonstrates gross grasp/release  RUE Coordination: decreased fine motor;decreased gross motor    Lower Extremity Assessment Lower Extremity Assessment: RLE deficits/detail RLE Deficits / Details: lifts antigravity with extra time and effort, limited with function needing assist for L weight shift to progress R LE       Communication   Communication: Expressive difficulties(dysarthria, word finding issues)  Cognition Arousal/Alertness: Awake/alert Behavior During Therapy: Impulsive;WFL for tasks assessed/performed Overall Cognitive Status: Impaired/Different from baseline Area of Impairment: Attention;Memory;Safety/judgement;Problem solving;Awareness;Following commands                   Current Attention Level: Sustained;Selective Memory: Decreased short-term memory Following Commands: Follows one step commands consistently Safety/Judgement: Decreased awareness of deficits;Decreased awareness of safety Awareness: Intellectual Problem Solving: Slow processing;Difficulty sequencing;Requires verbal cues;Requires tactile cues General Comments: uncertain how much of above deficits are new from current seizures or have been present since prior admission - no family present and pt unable to provide this info       General Comments General comments (skin integrity, edema, etc.): VSS with activity    Exercises     Assessment/Plan    PT Assessment Patient needs continued PT services  PT Problem List Decreased strength;Decreased activity tolerance;Decreased mobility;Decreased cognition;Decreased safety awareness;Decreased knowledge of use of DME;Decreased balance;Decreased coordination       PT Treatment Interventions DME instruction;Therapeutic activities;Balance training;Patient/family education;Therapeutic  exercise;Functional mobility training;Gait training    PT Goals (Current goals can be found in the Care Plan section)  Acute Rehab PT Goals Patient Stated Goal: get OOB, something to eat/drink PT Goal Formulation: With patient Time For Goal Achievement: 10/17/19 Potential to Achieve Goals: Fair    Frequency Min 4X/week   Barriers to discharge        Co-evaluation PT/OT/SLP Co-Evaluation/Treatment: Yes Reason for Co-Treatment: For patient/therapist safety;Necessary to address cognition/behavior during functional activity PT goals addressed during session: Mobility/safety with mobility;Balance;Proper use of DME OT goals addressed during session: ADL's and self-care       AM-PAC PT "6 Clicks" Mobility  Outcome Measure Help needed turning from your back to your side while in a flat bed without using bedrails?: A Little Help needed moving from lying on your back to sitting on the side of a flat bed without using bedrails?: A Lot Help needed moving to and from a bed to a chair (including a wheelchair)?: A Little Help needed standing up from a chair using your arms (e.g., wheelchair or bedside chair)?: A Little Help needed to walk in hospital room?: A Little Help needed climbing 3-5 steps with a railing? : A Lot 6 Click Score: 16    End of Session Equipment Utilized During Treatment: Gait belt Activity Tolerance: Patient limited by fatigue Patient left: in chair;with call bell/phone within reach;with chair alarm set   PT Visit Diagnosis: Other abnormalities of gait and mobility (R26.89);Hemiplegia and hemiparesis Hemiplegia - Right/Left: Right Hemiplegia - dominant/non-dominant: Dominant Hemiplegia - caused by: Unspecified    Time: 2774-1287 PT Time Calculation (min) (ACUTE ONLY): 36 min   Charges:   PT Evaluation $PT Eval Moderate Complexity: 1 Mod  Magda Kiel, [place] [phone] 10/03/2019   Reginia Naas 10/03/2019, 1:41  PM

## 2019-10-03 NOTE — Progress Notes (Signed)
NEUROLOGY PROGRESS NOTE  Subjective: States he does not feel good.  Still having trouble lifting his right arm and leg.  He is aware of the intracranial tumor.  Exam: Vitals:   10/02/19 2059 10/03/19 0817  BP: 131/75 (!) 145/69  Pulse: 91   Resp:    Temp: 97.9 F (36.6 C) 99.1 F (37.3 C)  SpO2: 93%    Physical Exam  Constitutional: Appears well-developed and well-nourished.  Eyes: No scleral injection HENT: No OP obstrucion Head: Normocephalic.  Cardiovascular: Normal rate and regular rhythm.  Respiratory: Effort normal, non-labored breathing GI: Soft.  No distension. There is no tenderness.  Skin: WDI   Neuro:  Mental Status: Awake Alert.  Speech dysarthric with word finding difficulties.  Able to follow simple commands . cranial Nerves: II:  Visual fields grossly normal,  III,IV, VI: ptosis not present, extra-ocular motions intact bilaterally pupils equal, round, reactive to light and accommodation V,VII: Right nasolabial fold decreased, VIII: hearing normal bilaterally Motor: Right : Upper extremity   note    left:     Upper extremity   5/5  Lower extremity   0/5     Lower extremity   5/5 -Able to raise right upper extremity approximately 1 inch off bed and elbow approximately 4 inches off the bed Tone and bulk:normal tone throughout; no atrophy noted NIHSS 1a Level of Conscious.: 0 1b LOC Questions: 2 1c LOC Commands: 0 2 Best Gaze: 0 3 Visual: 0 4 Facial Palsy: 1 5a Motor Arm - left: 0 5b Motor Arm - Right: 4 6a Motor Leg - Left: 0 6b Motor Leg - Right: 2 7 Limb Ataxia: 0 8 Sensory: 0 9 Best Language: 2 10 Dysarthria: 2 11 Extinct. and Inatten.: 0 TOTAL: 13    Medications:  Scheduled: .  stroke: mapping our early stages of recovery book   Does not apply Once  . atorvastatin  80 mg Oral q1800  . dexamethasone (DECADRON) injection  4 mg Intravenous Q6H  . enoxaparin (LOVENOX) injection  40 mg Subcutaneous Q24H  . insulin aspart  0-9 Units  Subcutaneous TID WC  . LORazepam  1 mg Intravenous Once   Continuous: . 0.9 % NaCl with KCl 40 mEq / L Stopped (10/03/19 0856)  . levETIRAcetam 1,000 mg (10/03/19 0908)   PRN:  Pertinent Labs/Diagnostics: White blood cell count 11.1  CT Head Wo Contrast  Result Date: 10/01/2019  IMPRESSION: Mildly motion degraded examination. Extensive vasogenic edema within the left cerebral hemisphere is similar to prior MRI 07/03/2019. Multiple intracranial metastases were better appreciated on this prior study, including a dominant 2.2 cm left parietal lobe metastasis. Redemonstrated mass effect with partial effacement of the left lateral ventricle. 4 mm rightward midline shift may be minimally increased from the prior exam. No acute intracranial hemorrhage. No definite loss of gray-white differentiation. Electronically Signed   By: Kellie Simmering DO   On: 10/01/2019 12:44   MR BRAIN W WO CONTRAST  Result Date: 10/02/2019 . IMPRESSION: 1. 4 mm acute infarct in the left centrum semiovale. 2. Slightly increased size of left parietal metastasis with unchanged extensive vasogenic edema and mass effect. 3. Unchanged subcentimeter metastases elsewhere. 4. No new metastases identified within limitations of motion artifact. Electronically Signed   By: Logan Bores M.D.   On: 10/02/2019 14:06   EEG adult  Result Date: 10/01/2019  IMPRESSION: This study showed evidence of focal convulsive status epilepticus arising from left hemisphere at the beginning of the study.  During the  seizure, patient was noted to have right facial twitching.  Seizure ended at 1332.  EEG also showed evidence of cortical dysfunction in left hemisphere likely secondary to underlying metastatic disease. The excessive beta activity seen in the background is most likely due to the effect of benzodiazepine and is a benign EEG pattern. Dr. Rory Percy was notified and study was transitioned to long-term EEG. Priyanka O Yadav   Overnight EEG with  video  Result Date: 10/02/2019  IMPRESSION: This study showed evidence of epileptogenicity in left frontotemporal region.  EEG also showed evidence of cortical dysfunction in left hemisphere likely secondary to underlying metastatic disease. The excessive beta activity seen in the background is most likely due to the effect of benzodiazepine and is a benign EEG pattern.  No definite seizures were seen during the study. [place] PA-C [facility] [phone]   Attending addendum Patient seen and examined Imaging reviewed personally. Agree with the exam above which I independently verified.  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   Was on continuous EEG which was discontinued after no seizures were seen after the medications above. Patient currently shows no further clinical epileptiform activity.   MRI done to evaluate the tumor showed a punctate left hemispheric stroke-likely incidental and not having any bearing on the presentation but nonetheless worthy of evaluating for risk factors.  Impression: -Epilepsia partialis continua-resolved -Breakthrough seizure in the setting of underlying brain metastasis from metastatic adenocarcinoma of the lung -Incidental finding of a punctate stroke in the left hemisphere-likely secondary to hypercoagulability from cancer  Recommendations:  #Seizure/partial focal status (resolved) secondary to the brain mets, brain metastases, vasogenic edema -Continue dexamethasone 4 mg every 6 hours.  Discussed with radiation oncology/oncology/neurosurgery regarding  tapering. -Continue Keppra 1 g twice daily -Maintain seizure precautions -Consult palliative care to be involved given the advanced cancer  #Left hemispheric punctate stroke-likely incidental with no bearing on current presentation, but secondary to possible hypercoagulability due to malignancy. -Goal A1c less than 7.  Goal LDL less than 70-currently is at 61. -Echo completed and pending.  Please call if abnormal. -Was on anticoagulation with Eliquis prior to presentation.  Would recommend resumption of anticoagulation as it was deemed that he would need it for hypercoagulability by oncology during his last discharge.  Given the small size of the stroke, no need to wait to resume anticoagulation and it can be resumed immediately if there are no other medical contraindications.  I would recommend Lovenox if hypercoagulability is deemed secondary to malignancy. -PT, OT, speech therapy. -Palliative care to be involved as above  Plan discussed with the primary team resident physician Dr. Madilyn Fireman over the phone  Neurology will be valuable as needed Please call with questions   10/03/2019, 10:43 AM  . --- Amie Portland, MD [facility] Pager: [phone]  If 7pm to 7am, please call on call as listed on AMION.

## 2019-10-04 ENCOUNTER — Encounter (HOSPITAL_COMMUNITY): Payer: Self-pay | Admitting: Internal Medicine

## 2019-10-04 DIAGNOSIS — R011 Cardiac murmur, unspecified: Secondary | ICD-10-CM

## 2019-10-04 LAB — GLUCOSE, CAPILLARY
Glucose-Capillary: 95 mg/dL (ref 70–99)
Glucose-Capillary: 131 mg/dL — ABNORMAL HIGH (ref 70–99)
Glucose-Capillary: 212 mg/dL — ABNORMAL HIGH (ref 70–99)
Glucose-Capillary: 184 mg/dL — ABNORMAL HIGH (ref 70–99)

## 2019-10-04 MED ORDER — GUAIFENESIN-DM 100-10 MG/5ML PO SYRP
5.0000 mL | ORAL_SOLUTION | ORAL | Status: DC | PRN
  Administered 2019-10-04 – 2019-10-06 (×5): 5 mL via ORAL
  Filled 2019-10-04 (×5): qty 5

## 2019-10-04 MED ORDER — SALINE SPRAY 0.65 % NA SOLN
1.0000 | NASAL | Status: DC | PRN
  Administered 2019-10-05: 1 via NASAL
  Filled 2019-10-04: qty 44

## 2019-10-04 MED ORDER — DEXAMETHASONE 4 MG PO TABS
4.0000 mg | ORAL_TABLET | Freq: Four times a day (QID) | ORAL | Status: DC
  Administered 2019-10-04 – 2019-10-07 (×13): 4 mg via ORAL
  Filled 2019-10-04 (×13): qty 1

## 2019-10-04 MED ORDER — LORATADINE 10 MG PO TABS
10.0000 mg | ORAL_TABLET | Freq: Every day | ORAL | Status: DC | PRN
  Administered 2019-10-04: 10 mg via ORAL
  Filled 2019-10-04: qty 1

## 2019-10-04 MED ORDER — PNEUMOCOCCAL VAC POLYVALENT 25 MCG/0.5ML IJ INJ
0.5000 mL | INJECTION | INTRAMUSCULAR | Status: AC
  Administered 2019-10-05: 0.5 mL via INTRAMUSCULAR
  Filled 2019-10-04: qty 0.5

## 2019-10-04 NOTE — Progress Notes (Signed)
  Speech Language Pathology Treatment: Dysphagia  Patient Details Name: Geoffrey Benjamin MRN: [id number] DOB: 02/16/1941 Today's Date: 10/04/2019 Time: 5093-2671 SLP Time Calculation (min) (ACUTE ONLY): 22 min  Assessment / Plan / Recommendation Clinical Impression  Limited skilled treatment with intake of Dysphagia 3 (mechanical soft)/thin liquids with max encouragement required for intake with decreased sustained attention/min agitation noted during trial with min verbal cues to cease talking during intake; small bites/sips with limited distractions during consumption without s/s of aspiration noted, but this was a small amount of food/liquid observed during trial.  Pt confused/agitated when SLP entered room which decreased with reassurance and confirmation of requests/needs.  Nursing stated pt exhibited a wet vocal quality post-intake and coughing noted as well.  Pt may benefit from MBS to objectively assess swallow if pt/family in agreement and/or discuss with palliative/GOC to determine safest intake if not; continue current diet of Dysphagia 3/thin liquids with general swallowing precautions and full supervision during intake to maintain attention/decrease agitation for safe consumption.  Hold tray if pt continues to exhibit overt s/s of aspiration during consumption.  ST will continue to f/u while in acute setting.   HPI HPI: 79 y.o. 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 MRI: punctate stroke left  centrum semiovale.  Mets in left parietal lobe with surrounding vasogenic edema.       SLP Plan  Continue with current plan of care   Potential MBS prn    Recommendations  Diet recommendations: Dysphagia 3 (mechanical soft);Thin liquid;Other(comment)(hold tray if agitation continues) Liquids provided via: Straw;Cup Medication Administration: Whole meds with puree Supervision: Full supervision/cueing for compensatory strategies;Patient able to self feed;Staff to assist with self feeding Compensations: Minimize environmental distractions;Slow rate;Small sips/bites;Multiple dry swallows after each bite/sip Postural Changes and/or Swallow Maneuvers: Seated upright 90 degrees                Oral Care Recommendations: Oral care BID Follow up Recommendations: Inpatient Rehab SLP Visit Diagnosis: Dysphagia, unspecified (R13.10) Plan: Continue with current plan of care                       Elvina Sidle, M.S., CCC-SLP 10/04/2019, 1:37 PM

## 2019-10-04 NOTE — Progress Notes (Signed)
Internal Medicine Attending:   I saw and examined the patient. I reviewed Dr Webb Silversmith note and I agree with the resident's findings and plan as documented in the resident's note.

## 2019-10-04 NOTE — Progress Notes (Signed)
Subjective: Geoffrey Benjamin was examined at bedside and seems to be doing much better this morning. He is completely alert and conversational. He denies pain or any complaints at the moment. We made him aware that we are currently pending admission to inpatient rehab.He states that he can usually ambulate at home without any issues however he seems to get weaker each time he is admitted to the hospital. All his questions and concerns were appropriately addressed.    Consults: neuro  Objective:  Vital signs in last 24 hours: Vitals:   10/03/19 1144 10/03/19 1625 10/03/19 2100 10/04/19 0828  BP: 132/76 129/72 137/75 (!) 149/80  Pulse: 100 86 88 89  Resp: (!) 23 18  16   Temp: 98.6 F (37 C) 98.8 F (37.1 C) 98.2 F (36.8 C) 98.8 F (37.1 C)  TempSrc: Oral  Oral Oral  SpO2: 98% 91% 94% 98%  Weight:      Height:       Physical Exam  Constitutional:  Chronically ill, thin appearing male  Cardiovascular: Normal rate and regular rhythm.  Murmur (diastolic murmur) heard. Pulmonary/Chest: Effort normal. No respiratory distress.  Abdominal: Soft.  thin  Neurological: He is alert.  Oriented to self; right side grossly weaker than left; no obvious differences in sensation   Skin: Skin is warm and dry. No erythema.  Nursing note and vitals reviewed.  I/Os:  Intake/Output Summary (Last 24 hours) at 10/04/2019 1110 Last data filed at 10/04/2019 0556 Gross per 24 hour  Intake 545 ml  Output 1675 ml  Net -1130 ml   Labs: Results for orders placed or performed during the hospital encounter of 10/01/19 (from the past 24 hour(s))  Glucose, capillary     Status: Abnormal   Collection Time: 10/03/19 11:56 AM  Result Value Ref Range   Glucose-Capillary 100 (H) 70 - 99 mg/dL  Glucose, capillary     Status: Abnormal   Collection Time: 10/03/19  5:29 PM  Result Value Ref Range   Glucose-Capillary 104 (H) 70 - 99 mg/dL  Glucose, capillary     Status: None   Collection Time: 10/03/19  9:23 PM    Result Value Ref Range   Glucose-Capillary 96 70 - 99 mg/dL  Glucose, capillary     Status: None   Collection Time: 10/04/19  8:24 AM  Result Value Ref Range   Glucose-Capillary 95 70 - 99 mg/dL    Assessment/Plan:  Assessment: Mr. Kearley is a 79 yo M w/ a hx of tobacco use (90 pack years), alcohol use, HTN, T2DM, HFrEF (EF 30-35% 09/2918) and recently diagnosed adenocarcinoma of the lung w/ brain metastases s/p SRS on 09/29/2019 on a steroid taper who presented in breakthrough status epilepticus found to have extensive vasogenic edema within the left cerebral hemisphere likely rebound from steroid taper.   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 without significant change in known metastases  -Neuro recommending stroke work up: Hgb A1c of 7.3 and lipid panel with total cholesterol of 111, triglycerides of  104, low HDL of 29 and elevated LDL of 61 -elevated Hgb A1c likely due to steroid use of the last month which will require stricter management outpatient -patient on atorvastatin 80 mg daily outpatient -speech says patient's swallowing function has worsened since last month's admission -PT/OT consulted, recommending CIR and consult placed -palliative consulted and patient switched to partial code but expresses frustration and discussion of limited life expectancy as he was told he could have 3-4 years with treatment -ECHO without evidence of  cardio embolic stroke source -patient alert and oriented today  Plan: -switch from IV to oral steroids -stricter DM control outpatient given steroid use -continue home atorvastatin 80 daily -continue oxy 5 q6 prn for pain -fu CIR consult recs  HTN: -home bp meds held due to soft bp on admission -bp stable to slightly elevated now  Plan: -continue to hold BP meds given possible acute infarct and defer to neuro recs  T2DM: -SSI  Plan: -close outpatient fu given increased A1c in setting of steroid use   Dispo: Anticipated discharge pending clinical course.  Al Decant, MD 10/04/2019, 11:10 AM Pager: [phone]

## 2019-10-04 NOTE — Progress Notes (Signed)
Patient has a cough, throat clearing and "wet" vocal quality after eating/drinking. He has mild expressive aphasia and dysarthria. I see that SLP has been ordered and he is on a dysphagia 3 diet; however, I believe he needs further assessment/intervention to avoid aspiration. Will notify MD.

## 2019-10-05 LAB — GLUCOSE, CAPILLARY
Glucose-Capillary: 119 mg/dL — ABNORMAL HIGH (ref 70–99)
Glucose-Capillary: 202 mg/dL — ABNORMAL HIGH (ref 70–99)
Glucose-Capillary: 177 mg/dL — ABNORMAL HIGH (ref 70–99)
Glucose-Capillary: 165 mg/dL — ABNORMAL HIGH (ref 70–99)

## 2019-10-05 LAB — CBC
HCT: 37 % — ABNORMAL LOW (ref 39.0–52.0)
Hemoglobin: 12.6 g/dL — ABNORMAL LOW (ref 13.0–17.0)
MCH: 31 pg (ref 26.0–34.0)
MCHC: 34.1 g/dL (ref 30.0–36.0)
MCV: 90.9 fL (ref 80.0–100.0)
Platelets: 240 10*3/uL (ref 150–400)
RBC: 4.07 MIL/uL — ABNORMAL LOW (ref 4.22–5.81)
RDW: 13.8 % (ref 11.5–15.5)
WBC: 11.4 10*3/uL — ABNORMAL HIGH (ref 4.0–10.5)
nRBC: 0 % (ref 0.0–0.2)

## 2019-10-05 LAB — BASIC METABOLIC PANEL
Anion gap: 12 (ref 5–15)
BUN: 14 mg/dL (ref 8–23)
CO2: 25 mmol/L (ref 22–32)
Calcium: 8.8 mg/dL — ABNORMAL LOW (ref 8.9–10.3)
Chloride: 99 mmol/L (ref 98–111)
Creatinine, Ser: 0.77 mg/dL (ref 0.61–1.24)
GFR calc Af Amer: >60 mL/min (ref >60)
GFR calc non Af Amer: >60 mL/min (ref >60)
Glucose, Bld: 146 mg/dL — ABNORMAL HIGH (ref 70–99)
Potassium: 4.3 mmol/L (ref 3.5–5.1)
Sodium: 136 mmol/L (ref 135–145)

## 2019-10-05 MED ORDER — IBUPROFEN 200 MG PO TABS
200.0000 mg | ORAL_TABLET | Freq: Two times a day (BID) | ORAL | Status: DC | PRN
  Administered 2019-10-05 – 2019-10-07 (×2): 200 mg via ORAL
  Filled 2019-10-05 (×2): qty 1

## 2019-10-05 NOTE — Progress Notes (Signed)
Physical Therapy Treatment Patient Details Name: Geoffrey Benjamin MRN: [id number] DOB: March 12, 1941 Today's Date: 10/05/2019    History of Present Illness 79 yo male admitted with focal satus epilepticus with Rt UE and Rt LE twitching.  MRI showed punctate stroke Lt centrum semiovale as well as slightly increased size of Lt parietal lobe met with unchanged surrounding vasogenic edema and unchanged subcentimeter metastases elsewhere.  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  HTN, DM, CHF, aortic stenosis.    PT Comments    Pt is progressing well towards goals and very motivated to participate with therapy. Performed sit<>stand 3 different ways for neuromuscular re-ed. Pt increased ambulation distance with RW. Continues to be an excellent candidate for CIR. Will continue to follow acutely.     Follow Up Recommendations  CIR     Equipment Recommendations  Wheelchair cushion (measurements PT);Wheelchair (measurements PT)    Recommendations for Other Services Rehab consult     Precautions / Restrictions Precautions Precautions: Fall Precaution Comments: R inattention Restrictions Weight Bearing Restrictions: No    Mobility  Bed Mobility               General bed mobility comments: up in chair on arrival  Transfers Overall transfer level: Needs assistance Equipment used: Rolling walker (2 wheeled) Transfers: Sit to/from Stand Sit to Stand: Min assist         General transfer comment: performed sit<>stand 12x. 4x normal, 4x from compliant surgace, & 4x with R LE placed at a disadvantage. Each time pt required min A to power up. Intermittant cues for technique.  Ambulation/Gait Ambulation/Gait assistance: Min assist Gait Distance (Feet): 25 Feet Assistive device: Rolling walker (2 wheeled) Gait Pattern/deviations: Decreased stride length;Step-to pattern;Decreased step  length - right;Decreased dorsiflexion - right;Shuffle;Trunk flexed Gait velocity: decreased Gait velocity interpretation: <1.31 ft/sec, indicative of household ambulator General Gait Details: Assist required to place R UE on RW, but able to stay in place once placed. VC for RW proximity.   Stairs             Wheelchair Mobility    Modified Rankin (Stroke Patients Only) Modified Rankin (Stroke Patients Only) Pre-Morbid Rankin Score: Moderate disability Modified Rankin: Moderately severe disability     Balance Overall balance assessment: Needs assistance Sitting-balance support: Feet supported Sitting balance-Leahy Scale: Fair Sitting balance - Comments: able to maintain static sitting with supervision after initial assist   Standing balance support: Bilateral upper extremity supported Standing balance-Leahy Scale: Poor Standing balance comment: Pt requires min A and bil. UE support                             Cognition Arousal/Alertness: Awake/alert Behavior During Therapy: WFL for tasks assessed/performed Overall Cognitive Status: Impaired/Different from baseline Area of Impairment: Safety/judgement;Problem solving;Awareness;Following commands                       Following Commands: Follows one step commands consistently Safety/Judgement: Decreased awareness of deficits;Decreased awareness of safety Awareness: Intellectual Problem Solving: Slow processing;Difficulty sequencing;Requires verbal cues;Requires tactile cues General Comments: Pt seems to be improving with safety awareness. He requested use of RW prior to ambulation.      Exercises      General Comments        Pertinent Vitals/Pain Faces Pain Scale: No hurt    Home Living  Prior Function            PT Goals (current goals can now be found in the care plan section) Acute Rehab PT Goals Patient Stated Goal: get up with the RW PT Goal  Formulation: With patient Time For Goal Achievement: 10/17/19 Potential to Achieve Goals: Fair Progress towards PT goals: Progressing toward goals    Frequency    Min 4X/week      PT Plan Current plan remains appropriate    Co-evaluation              AM-PAC PT "6 Clicks" Mobility   Outcome Measure  Help needed turning from your back to your side while in a flat bed without using bedrails?: A Little Help needed moving from lying on your back to sitting on the side of a flat bed without using bedrails?: A Lot Help needed moving to and from a bed to a chair (including a wheelchair)?: A Little Help needed standing up from a chair using your arms (e.g., wheelchair or bedside chair)?: A Little Help needed to walk in hospital room?: A Little Help needed climbing 3-5 steps with a railing? : A Lot 6 Click Score: 16    End of Session Equipment Utilized During Treatment: Gait belt Activity Tolerance: Patient tolerated treatment well Patient left: in chair;with call bell/phone within reach;with chair alarm set Nurse Communication: Mobility status(TV volume not working) PT Visit Diagnosis: Other abnormalities of gait and mobility (R26.89);Hemiplegia and hemiparesis Hemiplegia - Right/Left: Right Hemiplegia - dominant/non-dominant: Dominant Hemiplegia - caused by: Unspecified     Time: 9924-2683 PT Time Calculation (min) (ACUTE ONLY): 31 min  Charges:  $Gait Training: 8-22 mins $Neuromuscular Re-education: 8-22 mins                     Benjiman Core, [place] Pager [phone] Acute Rehab   Allena Katz 10/05/2019, 11:36 AM

## 2019-10-05 NOTE — Progress Notes (Signed)
Subjective: HD#4   Overnight: No acute overnight events reported  Today, Geoffrey Benjamin was examined sitting by the bedside recliner getting ready to have breakfast.  He states he has no complaints with exception of chronic headaches which he has been having intermittently during this hospitalization.  He states that usually at home he takes ibuprofen which helps with his symptoms.  He denies any associated nausea or vomiting.  Objective:  Vital signs in last 24 hours: Vitals:   10/03/19 2100 10/04/19 0828 10/04/19 1634 10/04/19 2250  BP: 137/75 (!) 149/80 129/67 136/64  Pulse: 88 89 83 78  Resp:  16 16 20   Temp: 98.2 F (36.8 C) 98.8 F (37.1 C) 98.1 F (36.7 C) 98.6 F (37 C)  TempSrc: Oral Oral Oral Oral  SpO2: 94% 98% 96% 94%  Weight:      Height:       Const: In no apparent distress, sitting comfortably in bedside recliner Resp: CTA BL, no wheezes, crackles, rhonchi CV: RRR, 4-8+ systolic murmur at the right second intercostal space, no gallop, rub  Assessment/Plan:  Active Problems:   Type 2 diabetes mellitus (HCC)   Adenocarcinoma of right lung, stage 4 (HCC)   Focal and partial seizures (HCC)   Status epilepticus (HCC)  Assessment: Geoffrey Benjamin is a 79 yo M w/ a hx of tobacco use (90 pack years), alcohol use, HTN, T2DM, HFrEF (EF 30-35% 09/2918) and recently diagnosed adenocarcinoma of the lung w/ brain metastases s/p SRS on 09/29/2019 on a steroid taper who presented in breakthrough status epilepticus found to have extensive vasogenic edema within the left cerebral hemisphere likely rebound from steroid taper.   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 without significant change in known metastases  -Neuro recommending stroke work up: Hgb A1c of 7.3 and lipid panel with total cholesterol of 111, triglycerides of 104, low HDL of 29 and elevated LDL of 61 -elevated Hgb A1c likely due to steroid use of the last month which will require stricter management outpatient -patient on atorvastatin 80 mg daily outpatient -speech says patient's swallowing function has worsened since last month's admission -PT/OT consulted, recommending CIR and consult placed -palliative consulted and patient switched to partial code but expresses frustration and discussion of limited life expectancy as he was told he could have 3-4 years with treatment -ECHO without evidence of cardio embolic stroke source  Plan: -Continue oral dexamethasone 4mg  q6h -stricter DM control outpatient given steroid use -continue home atorvastatin 80 daily -continue oxy 5 q6 prn for pain -fu CIR consult recs  HTN: -home bp meds held due to soft bp on admission -bp stable to slightly elevated now  Plan: -continue to hold BP meds given possible acute infarct and defer to neuro recs  T2DM: -CBGs in the 90s-200s -SSI  Plan: -close outpatient f/u given increased A1c in setting of steroid use    Dispo: Anticipated discharge pending clinical course.   [name], MD 10/05/2019, 6:22 AM Pager: [phone] Internal Medicine Teaching  Service

## 2019-10-06 LAB — GLUCOSE, CAPILLARY
Glucose-Capillary: 145 mg/dL — ABNORMAL HIGH (ref 70–99)
Glucose-Capillary: 179 mg/dL — ABNORMAL HIGH (ref 70–99)
Glucose-Capillary: 208 mg/dL — ABNORMAL HIGH (ref 70–99)
Glucose-Capillary: 175 mg/dL — ABNORMAL HIGH (ref 70–99)

## 2019-10-06 MED ORDER — RAMELTEON 8 MG PO TABS
8.0000 mg | ORAL_TABLET | Freq: Every day | ORAL | Status: DC
  Administered 2019-10-06: 8 mg via ORAL
  Filled 2019-10-06 (×3): qty 1

## 2019-10-06 MED ORDER — HYDROCHLOROTHIAZIDE 10 MG/ML ORAL SUSPENSION
6.2500 mg | Freq: Every day | ORAL | Status: DC
  Administered 2019-10-06 – 2019-10-07 (×2): 6.25 mg via ORAL
  Filled 2019-10-06 (×3): qty 1.25

## 2019-10-06 NOTE — Progress Notes (Signed)
Subjective: HD#5   Overnight: No acute events reported  Today, Geoffrey Benjamin says he is not doing well today. Says he is not getting good sleep at night. He states he can only sleep 3-4 hours a night. He also states he is ready to "hang it up".  He says there isn't anything we can do to help him feel better. He states he doesn't want to live anymore and is tired of getting treatments.   Pt is unable to say what his birthday is, and the reason he is in the hospital is because he screwed up. He knows he is in the hospital. He states that he has "bad cancer". He asks what do we think his chances are with his cancer. We explained that it is hard to predict how long he has, but we can talk to the oncology and reach out to palliative to help [place] with his care.  Objective:  Vital signs in last 24 hours: Vitals:   10/04/19 2250 10/05/19 0820 10/05/19 1700 10/05/19 2244  BP: 136/64 130/69 132/62 (!) 149/85  Pulse: 78 92 85 89  Resp: 20 18  20   Temp: 98.6 F (37 C) 98.6 F (37 C) 98.7 F (37.1 C) 97.8 F (36.6 C)  TempSrc: Oral Oral Oral Oral  SpO2: 94% 97% 98% 100%  Weight:      Height:       Const: Sitting comfortably in bedside recliner HEENT: Atraumatic, normocephalic CV: 3-0+ systolic murmur, no gallops or rubs   Assessment/Plan:  Active Problems:   Type 2 diabetes mellitus (HCC)   Adenocarcinoma of right lung, stage 4 (HCC)   Focal and partial seizures (HCC)   Status epilepticus (HCC)  Assessment: [name] is a 79 yo M w/ a hx of tobacco use (90 pack years), alcohol use, HTN, T2DM, HFrEF (EF 30-35% 09/2918) and recently diagnosed adenocarcinoma of the lung w/ brain metastases s/p SRS on 09/29/2019 on a steroid taper who presented in breakthrough status epilepticus found to have extensive vasogenic edema within the left cerebral hemisphere likely rebound from steroid taper.   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 without significant change in known metastases  -Neuro recommending stroke work up: Hgb A1c of 7.3 and lipid panel with total cholesterol of 111, triglycerides of 104, low HDL of 29 and elevated LDL of 61 -elevated Hgb A1c likely due to steroid use of the last month which will require stricter management outpatient -patient on atorvastatin 80 mg daily outpatient -speech says patient's swallowing function has worsened since last month's admission -PT/OT consulted, recommending CIR and consult placed -Palliative consulted and patient switched to partial code but expresses frustration and discussion of limited life expectancy as he was told he could have 3-4 years with treatment   Plan: -Continue oral dexamethasone 4mg  q6h -stricter DM control outpatient given steroid use -continuehome atorvastatin 80 daily -continueoxy 5 q6 prn for pain -fu CIR consult recs -Appreciate palliative medicine assistance -Will make his oncologist (Dr. Earlie Server) aware of  his hospitalization   HTN: -Start HCTZ 6.25mg  daily    T2DM: -SSI -close outpatient f/u given increased A1c in setting of steroid use      Dispo: To CIR  [name], MD 10/06/2019, 6:19 AM Pager: [phone] Internal Medicine Teaching Service

## 2019-10-07 ENCOUNTER — Other Ambulatory Visit: Payer: Self-pay

## 2019-10-07 ENCOUNTER — Encounter (HOSPITAL_COMMUNITY): Payer: Self-pay | Admitting: Physical Medicine & Rehabilitation

## 2019-10-07 ENCOUNTER — Other Ambulatory Visit: Payer: Self-pay | Admitting: Radiation Oncology

## 2019-10-07 ENCOUNTER — Inpatient Hospital Stay (HOSPITAL_COMMUNITY)
Admission: RE | Admit: 2019-10-07 | Discharge: 2019-10-16 | DRG: 091 | Disposition: A | Discharge status: Home Health | Payer: Medicare Other | Source: Intra-hospital | Attending: Physical Medicine & Rehabilitation | Admitting: Physical Medicine & Rehabilitation

## 2019-10-07 ENCOUNTER — Telehealth: Payer: Self-pay | Admitting: *Deleted

## 2019-10-07 DIAGNOSIS — Z841 Family history of disorders of kidney and ureter: Secondary | ICD-10-CM | POA: Diagnosis not present

## 2019-10-07 DIAGNOSIS — I700 Atherosclerosis of aorta: Secondary | ICD-10-CM | POA: Diagnosis present

## 2019-10-07 DIAGNOSIS — F17210 Nicotine dependence, cigarettes, uncomplicated: Secondary | ICD-10-CM | POA: Diagnosis present

## 2019-10-07 DIAGNOSIS — I69390 Apraxia following cerebral infarction: Secondary | ICD-10-CM | POA: Diagnosis not present

## 2019-10-07 DIAGNOSIS — I639 Cerebral infarction, unspecified: Secondary | ICD-10-CM | POA: Diagnosis not present

## 2019-10-07 DIAGNOSIS — I69351 Hemiplegia and hemiparesis following cerebral infarction affecting right dominant side: Secondary | ICD-10-CM | POA: Diagnosis not present

## 2019-10-07 DIAGNOSIS — I5042 Chronic combined systolic (congestive) and diastolic (congestive) heart failure: Secondary | ICD-10-CM | POA: Diagnosis present

## 2019-10-07 DIAGNOSIS — I110 Hypertensive heart disease with heart failure: Secondary | ICD-10-CM | POA: Diagnosis present

## 2019-10-07 DIAGNOSIS — Z7901 Long term (current) use of anticoagulants: Secondary | ICD-10-CM | POA: Diagnosis not present

## 2019-10-07 DIAGNOSIS — K5900 Constipation, unspecified: Secondary | ICD-10-CM | POA: Diagnosis present

## 2019-10-07 DIAGNOSIS — C3491 Malignant neoplasm of unspecified part of right bronchus or lung: Secondary | ICD-10-CM | POA: Diagnosis present

## 2019-10-07 DIAGNOSIS — E119 Type 2 diabetes mellitus without complications: Secondary | ICD-10-CM | POA: Diagnosis present

## 2019-10-07 DIAGNOSIS — I350 Nonrheumatic aortic (valve) stenosis: Secondary | ICD-10-CM | POA: Diagnosis present

## 2019-10-07 DIAGNOSIS — I69391 Dysphagia following cerebral infarction: Secondary | ICD-10-CM | POA: Diagnosis not present

## 2019-10-07 DIAGNOSIS — F329 Major depressive disorder, single episode, unspecified: Secondary | ICD-10-CM

## 2019-10-07 DIAGNOSIS — C799 Secondary malignant neoplasm of unspecified site: Secondary | ICD-10-CM | POA: Diagnosis not present

## 2019-10-07 DIAGNOSIS — I69393 Ataxia following cerebral infarction: Secondary | ICD-10-CM

## 2019-10-07 DIAGNOSIS — R1310 Dysphagia, unspecified: Secondary | ICD-10-CM | POA: Diagnosis present

## 2019-10-07 DIAGNOSIS — Z7982 Long term (current) use of aspirin: Secondary | ICD-10-CM

## 2019-10-07 DIAGNOSIS — E785 Hyperlipidemia, unspecified: Secondary | ICD-10-CM | POA: Diagnosis present

## 2019-10-07 DIAGNOSIS — Z7984 Long term (current) use of oral hypoglycemic drugs: Secondary | ICD-10-CM | POA: Diagnosis not present

## 2019-10-07 DIAGNOSIS — R269 Unspecified abnormalities of gait and mobility: Secondary | ICD-10-CM | POA: Diagnosis present

## 2019-10-07 DIAGNOSIS — Z823 Family history of stroke: Secondary | ICD-10-CM

## 2019-10-07 DIAGNOSIS — G40901 Epilepsy, unspecified, not intractable, with status epilepticus: Secondary | ICD-10-CM | POA: Diagnosis present

## 2019-10-07 DIAGNOSIS — C7931 Secondary malignant neoplasm of brain: Secondary | ICD-10-CM | POA: Diagnosis present

## 2019-10-07 DIAGNOSIS — G936 Cerebral edema: Secondary | ICD-10-CM | POA: Diagnosis present

## 2019-10-07 DIAGNOSIS — K219 Gastro-esophageal reflux disease without esophagitis: Secondary | ICD-10-CM | POA: Diagnosis present

## 2019-10-07 DIAGNOSIS — R531 Weakness: Secondary | ICD-10-CM

## 2019-10-07 DIAGNOSIS — J439 Emphysema, unspecified: Secondary | ICD-10-CM

## 2019-10-07 DIAGNOSIS — L8990 Pressure ulcer of unspecified site, unspecified stage: Secondary | ICD-10-CM | POA: Insufficient documentation

## 2019-10-07 DIAGNOSIS — I5032 Chronic diastolic (congestive) heart failure: Secondary | ICD-10-CM | POA: Diagnosis not present

## 2019-10-07 LAB — GLUCOSE, CAPILLARY
Glucose-Capillary: 171 mg/dL — ABNORMAL HIGH (ref 70–99)
Glucose-Capillary: 221 mg/dL — ABNORMAL HIGH (ref 70–99)
Glucose-Capillary: 217 mg/dL — ABNORMAL HIGH (ref 70–99)
Glucose-Capillary: 255 mg/dL — ABNORMAL HIGH (ref 70–99)

## 2019-10-07 MED ORDER — LEVETIRACETAM 500 MG PO TABS
1000.0000 mg | ORAL_TABLET | Freq: Two times a day (BID) | ORAL | Status: DC
  Administered 2019-10-07 – 2019-10-16 (×18): 1000 mg via ORAL
  Filled 2019-10-07 (×19): qty 2

## 2019-10-07 MED ORDER — GUAIFENESIN-DM 100-10 MG/5ML PO SYRP
5.0000 mL | ORAL_SOLUTION | ORAL | Status: DC | PRN
  Administered 2019-10-09 – 2019-10-12 (×6): 5 mL via ORAL
  Filled 2019-10-07 (×6): qty 5

## 2019-10-07 MED ORDER — DEXAMETHASONE 4 MG PO TABS: 2.0000 mg | ORAL_TABLET | Freq: Two times a day (BID) | ORAL | Status: DC

## 2019-10-07 MED ORDER — DEXAMETHASONE 2 MG PO TABS: 2.0000 mg | ORAL_TABLET | Freq: Two times a day (BID) | ORAL | Status: DC

## 2019-10-07 MED ORDER — SALINE SPRAY 0.65 % NA SOLN
1.0000 | NASAL | Status: DC | PRN
  Filled 2019-10-07: qty 44

## 2019-10-07 MED ORDER — MIRTAZAPINE 15 MG PO TBDP
15.0000 mg | ORAL_TABLET | Freq: Every day | ORAL | Status: DC
  Filled 2019-10-07: qty 1

## 2019-10-07 MED ORDER — DEXAMETHASONE 4 MG PO TABS: 4.0000 mg | ORAL_TABLET | Freq: Three times a day (TID) | ORAL | Status: DC

## 2019-10-07 MED ORDER — SERTRALINE HCL 25 MG PO TABS: 25.0000 mg | ORAL_TABLET | Freq: Every day | ORAL | Status: DC

## 2019-10-07 MED ORDER — ASPIRIN EC 81 MG PO TBEC
81.0000 mg | DELAYED_RELEASE_TABLET | Freq: Every day | ORAL | Status: DC
  Administered 2019-10-08 – 2019-10-16 (×9): 81 mg via ORAL
  Filled 2019-10-07 (×9): qty 1

## 2019-10-07 MED ORDER — SORBITOL 70 % SOLN: 30.0000 mL | Freq: Every day | Status: DC | PRN

## 2019-10-07 MED ORDER — DEXAMETHASONE 4 MG PO TABS
4.0000 mg | ORAL_TABLET | Freq: Three times a day (TID) | ORAL | Status: DC
  Administered 2019-10-07 (×2): 4 mg via ORAL
  Filled 2019-10-07 (×2): qty 1

## 2019-10-07 MED ORDER — DEXAMETHASONE 4 MG PO TABS: 4.0000 mg | ORAL_TABLET | Freq: Two times a day (BID) | ORAL | 0 refills | Status: DC

## 2019-10-07 MED ORDER — ATORVASTATIN CALCIUM 80 MG PO TABS
80.0000 mg | ORAL_TABLET | Freq: Every day | ORAL | Status: DC
  Administered 2019-10-07 – 2019-10-15 (×9): 80 mg via ORAL
  Filled 2019-10-07 (×10): qty 1

## 2019-10-07 MED ORDER — DEXAMETHASONE 2 MG PO TABS: 2.0000 mg | ORAL_TABLET | Freq: Every day | ORAL | 0 refills | Status: DC

## 2019-10-07 MED ORDER — OXYCODONE HCL 5 MG PO TABS: 5.0000 mg | ORAL_TABLET | Freq: Four times a day (QID) | ORAL | 0 refills | Status: DC | PRN

## 2019-10-07 MED ORDER — IBUPROFEN 400 MG PO TABS: 200.0000 mg | ORAL_TABLET | Freq: Two times a day (BID) | ORAL | Status: DC | PRN

## 2019-10-07 MED ORDER — INSULIN ASPART 100 UNIT/ML ~~LOC~~ SOLN
0.0000 [IU] | Freq: Three times a day (TID) | SUBCUTANEOUS | Status: DC
  Administered 2019-10-08: 2 [IU] via SUBCUTANEOUS
  Administered 2019-10-08: 3 [IU] via SUBCUTANEOUS
  Administered 2019-10-08: 5 [IU] via SUBCUTANEOUS
  Administered 2019-10-09: 3 [IU] via SUBCUTANEOUS
  Administered 2019-10-09: 7 [IU] via SUBCUTANEOUS
  Administered 2019-10-09: 3 [IU] via SUBCUTANEOUS
  Administered 2019-10-10: 2 [IU] via SUBCUTANEOUS
  Administered 2019-10-10: 7 [IU] via SUBCUTANEOUS
  Administered 2019-10-10: 5 [IU] via SUBCUTANEOUS
  Administered 2019-10-11: 19:00:00 7 [IU] via SUBCUTANEOUS
  Administered 2019-10-11 (×2): 5 [IU] via SUBCUTANEOUS
  Administered 2019-10-12: 3 [IU] via SUBCUTANEOUS
  Administered 2019-10-12: 5 [IU] via SUBCUTANEOUS
  Administered 2019-10-12: 1 [IU] via SUBCUTANEOUS
  Administered 2019-10-13: 3 [IU] via SUBCUTANEOUS
  Administered 2019-10-13: 5 [IU] via SUBCUTANEOUS
  Administered 2019-10-13: 2 [IU] via SUBCUTANEOUS
  Administered 2019-10-14 (×3): 3 [IU] via SUBCUTANEOUS
  Administered 2019-10-15: 2 [IU] via SUBCUTANEOUS
  Administered 2019-10-15: 5 [IU] via SUBCUTANEOUS
  Administered 2019-10-15: 2 [IU] via SUBCUTANEOUS
  Administered 2019-10-16: 5 [IU] via SUBCUTANEOUS

## 2019-10-07 MED ORDER — DEXAMETHASONE 4 MG PO TABS: 4.0000 mg | ORAL_TABLET | Freq: Three times a day (TID) | ORAL | 0 refills | Status: DC

## 2019-10-07 MED ORDER — OXYCODONE HCL 5 MG PO TABS
5.0000 mg | ORAL_TABLET | Freq: Four times a day (QID) | ORAL | Status: DC | PRN
  Administered 2019-10-09 – 2019-10-16 (×10): 5 mg via ORAL
  Filled 2019-10-07 (×10): qty 1

## 2019-10-07 MED ORDER — ENSURE ENLIVE PO LIQD
237.0000 mL | Freq: Two times a day (BID) | ORAL | Status: DC
  Administered 2019-10-08 – 2019-10-15 (×3): 237 mL via ORAL

## 2019-10-07 MED ORDER — DEXAMETHASONE 4 MG PO TABS: 2.0000 mg | ORAL_TABLET | ORAL | Status: DC

## 2019-10-07 MED ORDER — DEXAMETHASONE 2 MG PO TABS: 2.0000 mg | ORAL_TABLET | ORAL | 0 refills | Status: DC

## 2019-10-07 MED ORDER — DEXAMETHASONE 2 MG PO TABS: 2.0000 mg | ORAL_TABLET | Freq: Every day | ORAL | Status: DC

## 2019-10-07 MED ORDER — RAMELTEON 8 MG PO TABS
8.0000 mg | ORAL_TABLET | Freq: Every day | ORAL | Status: DC
  Administered 2019-10-07: 8 mg via ORAL
  Filled 2019-10-07: qty 1

## 2019-10-07 MED ORDER — DEXAMETHASONE 2 MG PO TABS: 2.0000 mg | ORAL_TABLET | ORAL | Status: DC

## 2019-10-07 MED ORDER — LEVETIRACETAM IN NACL 1000 MG/100ML IV SOLN: 1000.0000 mg | Freq: Two times a day (BID) | INTRAVENOUS | 0 refills | Status: DC

## 2019-10-07 MED ORDER — DEXAMETHASONE 2 MG PO TABS: 2.0000 mg | ORAL_TABLET | Freq: Two times a day (BID) | ORAL | 0 refills | Status: DC

## 2019-10-07 MED ORDER — NICOTINE 21 MG/24HR TD PT24
21.0000 mg | MEDICATED_PATCH | Freq: Every day | TRANSDERMAL | Status: DC
  Administered 2019-10-08 – 2019-10-16 (×9): 21 mg via TRANSDERMAL
  Filled 2019-10-07 (×9): qty 1

## 2019-10-07 MED ORDER — HYDROCHLOROTHIAZIDE 10 MG/ML ORAL SUSPENSION
6.2500 mg | Freq: Every day | ORAL | Status: DC
  Administered 2019-10-08 – 2019-10-15 (×8): 6.25 mg via ORAL
  Filled 2019-10-07 (×9): qty 1.25

## 2019-10-07 MED ORDER — DEXAMETHASONE 4 MG PO TABS
4.0000 mg | ORAL_TABLET | Freq: Three times a day (TID) | ORAL | Status: AC
  Administered 2019-10-07 – 2019-10-14 (×20): 4 mg via ORAL
  Filled 2019-10-07 (×21): qty 1

## 2019-10-07 MED ORDER — APIXABAN 2.5 MG PO TABS
2.5000 mg | ORAL_TABLET | Freq: Two times a day (BID) | ORAL | Status: DC
  Administered 2019-10-07 – 2019-10-16 (×18): 2.5 mg via ORAL
  Filled 2019-10-07 (×19): qty 1

## 2019-10-07 MED ORDER — MIRTAZAPINE 15 MG PO TBDP: 15.0000 mg | ORAL_TABLET | Freq: Every day | ORAL | 0 refills | Status: DC

## 2019-10-07 MED ORDER — DEXAMETHASONE 4 MG PO TABS
4.0000 mg | ORAL_TABLET | Freq: Two times a day (BID) | ORAL | Status: DC
  Administered 2019-10-14 – 2019-10-16 (×4): 4 mg via ORAL
  Filled 2019-10-07 (×4): qty 1

## 2019-10-07 MED ORDER — RAMELTEON 8 MG PO TABS: 8.0000 mg | ORAL_TABLET | Freq: Every day | ORAL | 0 refills | Status: DC

## 2019-10-07 MED ORDER — MIRTAZAPINE 15 MG PO TBDP
15.0000 mg | ORAL_TABLET | Freq: Every day | ORAL | Status: DC
  Administered 2019-10-07 – 2019-10-15 (×9): 15 mg via ORAL
  Filled 2019-10-07 (×9): qty 1

## 2019-10-07 MED ORDER — DEXAMETHASONE 4 MG PO TABS: 4.0000 mg | ORAL_TABLET | Freq: Two times a day (BID) | ORAL | Status: DC

## 2019-10-07 MED ORDER — HYDROCHLOROTHIAZIDE 10 MG/ML ORAL SUSPENSION: 6.2500 mg | Freq: Every day | ORAL | 0 refills | Status: DC

## 2019-10-07 MED ORDER — ENSURE ENLIVE PO LIQD: 237.0000 mL | Freq: Two times a day (BID) | ORAL | 12 refills | Status: DC

## 2019-10-07 MED ORDER — DEXAMETHASONE 4 MG PO TABS: 2.0000 mg | ORAL_TABLET | Freq: Every day | ORAL | Status: DC

## 2019-10-07 MED ORDER — APIXABAN 2.5 MG PO TABS: 2.5000 mg | ORAL_TABLET | Freq: Two times a day (BID) | ORAL | 0 refills | Status: DC

## 2019-10-07 MED ORDER — NICOTINE 21 MG/24HR TD PT24: 21.0000 mg | MEDICATED_PATCH | Freq: Every day | TRANSDERMAL | 0 refills | Status: DC

## 2019-10-07 NOTE — Care Management Important Message (Signed)
Important Message  Patient Details  Name: Geoffrey Benjamin MRN: [id number] Date of Birth: [birth year]   Medicare Important Message Given:  Yes     Orbie Pyo 10/07/2019, 3:28 PM

## 2019-10-07 NOTE — PMR Pre-admission (Signed)
PMR Admission Coordinator Pre-Admission Assessment  Patient: Geoffrey Benjamin is an 79 y.o., male MRN: [id number] DOB: [birth year] Height: _0  (185.4 cm) Weight: 72.4 kg  Insurance Information HMO:     PPO:      PCP:      IPA:      80/20:      OTHER:  PRIMARY: Medicare A and B      Policy#: [id number]      Subscriber: patient CM Name:       Phone#:      Fax#:  Pre-Cert#: verified Civil engineer, contracting: n/a Benefits:  Phone #:      Name:  Eff. Date: 08/03/2008     Deduct: $1484      Out of Pocket Max: n/a      Life Max: n/a CIR: 100%      SNF: 20 full days Outpatient: 80%     Co-Pay: 20% Home Health: 100%      Co-Pay:  DME: 80%     Co-Pay: 20% Providers:  SECONDARY:       Policy#:       Subscriber:  CM Name:       Phone#:      Fax#:  Pre-Cert#:       Employer:  Benefits:  Phone #:      Name:  Eff. Date:      Deduct:       Out of Pocket Max:       Life Max:  CIR:       SNF:  Outpatient:      Co-Pay:  Home Health:       Co-Pay:  DME:      Co-Pay:   Medicaid Application Date:       Case Manager:  Disability Application Date:       Case Worker:   The "Data Collection Information Summary" for patients in Inpatient Rehabilitation Facilities with attached "Privacy Act [place] Records" was provided and verbally reviewed with: Patient and Family  Emergency Contact Information Contact Information    Name Relation Home Work Mobile   Benjamin,Geoffrey Spouse [phone]  Benjamin, Geoffrey   Geoffrey, Benjamin    [phone]      Current Medical History  Patient Admitting Diagnosis: 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 Earlie Server and Dr. Lisbeth Renshaw at the [facility] health cancer center.   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  Presented 10/01/2019 with repetitive twitching and jerking-like movements to the right upper extremity and right facial twitching.  Patient did receive 7.5 mg of Versed on route to the ED with noticeable improvement.  Cranial CT scan showed extensive vasogenic edema within the left cerebral hemisphere similar to prior MRI on 07/03/2019.  Multiple intracranial metastasis including a dominant 2.2 cm left parietal lobe metastasis.  Redemonstrated mass-effect with partial effacement of the left lateral ventricle.  4 mm rightward midline shift.  No acute intracranial hemorrhage.  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  No new mets identified within limitations of motion artifact.  Currently maintained on Eliquis for CVA prophylaxis.  Echocardiogram with ejection fraction of 65%.  Patient was loaded with Keppra as well as Decadron taper as indicated.  Palliative care consulted to establish goals of care.  Oncology service Dr. Julien Nordmann recommends follow-up outpatient.  Tolerating mechanical soft diet.  Therapy evaluations completed and patient was recommended for a comprehensive rehab program.  Complete NIHSS TOTAL: 7  Patient's medical record from [facility] has been reviewed by the rehabilitation admission coordinator and physician.  Past Medical History  Past Medical History:  Diagnosis Date  . Aortic stenosis 09/13/2018  . CAP (community acquired pneumonia) 09/13/2018  . CHF (congestive heart failure) ([place])   . Difficulty  walking   . DM (diabetes mellitus) ([place])   . GERD 06/04/2009   Qualifier: Diagnosis of  By: Henrene Pastor MD, Docia Chuck   . Hyperlipidemia 09/13/2018  . Hypertension   . Loss of weight 09/29/2008   Qualifier: Diagnosis of  By: Bobby Rumpf CMA (AAMA), Patty    . Non-small cell carcinoma of right lung, stage 4 ([place])   . PERSONAL HX COLONIC POLYPS 06/04/2009   Qualifier: Diagnosis of  By: Henrene Pastor MD, [place] PAIN 09/29/2008   Qualifier: Diagnosis of  By: Bobby Rumpf CMA (AAMA), Patty    . Tachycardia   . Tobacco use 09/13/2018  . Volume overload 09/13/2018    Family History   family history includes Kidney disease in his father; Stroke in his mother.  Prior Rehab/Hospitalizations Has the patient had prior rehab or hospitalizations prior to admission? No  Has the patient had major surgery during 100 days prior to admission? No   Current Medications  Current Facility-Administered Medications:  .   stroke: mapping our early stages of recovery book, , Does not apply, Once, Amie Portland, MD .  0.9 % NaCl with KCl 40 mEq / L  infusion, , Intravenous, Continuous, Agyei, Obed K, MD, Last Rate: 75 mL/hr at 10/06/19 1118, 75 mL/hr at 10/06/19 1118 .  apixaban (ELIQUIS) tablet 5 mg, 5 mg, Oral, BID, Agyei, Obed K, MD, 5 mg at 10/07/19 0856 .  atorvastatin (LIPITOR) tablet 80 mg, 80 mg, Oral, q1800, Al Decant, MD, 80 mg at 10/06/19 1654 .  dexamethasone (DECADRON) tablet 4 mg, 4 mg, Oral, TID, 4 mg at 10/07/19 1036 **FOLLOWED BY** [START ON 10/14/2019] dexamethasone (DECADRON) tablet 4 mg, 4 mg, Oral, BID **FOLLOWED BY** [START ON 10/21/2019] dexamethasone (DECADRON) tablet 2 mg, 2 mg, Oral, BID **FOLLOWED BY** [START ON 10/28/2019] dexamethasone (DECADRON) tablet 2 mg, 2 mg, Oral, Daily **FOLLOWED BY** [START ON 11/04/2019] dexamethasone (DECADRON) tablet 2 mg, 2 mg, Oral, QODAY, Hoffman, Erik C, DO .  feeding supplement (ENSURE ENLIVE) (ENSURE ENLIVE) liquid 237 mL, 237 mL, Oral, BID BM, Hoffman, Erik C, DO, 237 mL  at 10/07/19 0854 .  guaiFENesin-dextromethorphan (ROBITUSSIN DM) 100-10 MG/5ML syrup 5 mL, 5 mL, Oral, Q4H PRN, Heber [place], Erik C, DO, 5 mL at 10/06/19 0837 .  hydrochlorothiazide 10 mg/mL oral suspension 6.25 mg, 6.25 mg, Oral, Daily, Agyei, Obed K, MD, 6.25 mg at 10/07/19 1036 .  ibuprofen (ADVIL) tablet 200 mg, 200 mg, Oral, BID PRN, Eileen Stanford, Obed K, MD, 200 mg at 10/07/19 0909 .  insulin aspart (novoLOG) injection 0-9 Units, 0-9 Units, Subcutaneous, TID  WC, [name], MD, 3 Units at 10/07/19 1244 .  levETIRAcetam (KEPPRA) IVPB 1000 mg/100 mL premix, 1,000 mg, Intravenous, Q12H, Amie Portland, MD, Last Rate: 400 mL/hr at 10/07/19 0903, 1,000 mg at 10/07/19 0903 .  loratadine (CLARITIN) tablet 10 mg, 10 mg, Oral, Daily PRN, Lucious Groves, DO, 10 mg at 10/04/19 1850 .  LORazepam (ATIVAN) injection 0.5 mg, 0.5 mg, Intravenous, Q4H PRN, Lane Hacker L, DO .  LORazepam (ATIVAN) injection 1 mg, 1 mg, Intravenous, Once, [name], MD, Stopped at 10/01/19 1319 .  mirtazapine (REMERON SOL-TAB) disintegrating tablet 15 mg, 15 mg, Oral, QHS, Al Decant, MD .  nicotine (NICODERM CQ - dosed in mg/24 hours) patch 21 mg, 21 mg, Transdermal, Daily, Lane Hacker L, DO, 21 mg at 10/07/19 0857 .  oxyCODONE (Oxy IR/ROXICODONE) immediate release tablet 5 mg, 5 mg, Oral, Q6H PRN, Al Decant, MD, 5 mg at 10/07/19 0909 .  ramelteon (ROZEREM) tablet 8 mg, 8 mg, Oral, QHS, Jose Persia, MD, 8 mg at 10/06/19 2239 .  sodium chloride (OCEAN) 0.65 % nasal spray 1 spray, 1 spray, Each Nare, PRN, Lucious Groves, DO, 1 spray at 10/05/19 1246  Patients Current Diet:  Diet Order            Diet - low sodium heart healthy        DIET DYS 3 Room service appropriate? Yes with Assist; Fluid consistency: Thin  Diet effective now              Precautions / Restrictions Precautions Precautions: Fall Precaution Comments: R inattention Restrictions Weight Bearing Restrictions: No   Has the  patient had 2 or more falls or a fall with injury in the past year? Yes  Prior Activity Level Limited Community (1-2x/wk): not driving, using AD for ambulation (cane? vs walker?)   Prior Functional Level Self Care: Did the patient need help bathing, dressing, using the toilet or eating? Independent  Indoor Mobility: Did the patient need assistance with walking from room to room (with or without device)? Independent  Stairs: Did the patient need assistance with internal or external stairs (with or without device)? Independent  Functional Cognition: Did the patient need help planning regular tasks such as shopping or remembering to take medications? Needed some help  Home Assistive Devices / [place] Devices/Equipment: None Home Equipment: Walker - 2 wheels  Prior Device Use: Indicate devices/aids used by the patient prior to current illness, exacerbation or injury? Walker  Current Functional Level Cognition  Arousal/Alertness: Awake/alert Overall Cognitive Status: Impaired/Different from baseline Current Attention Level: Sustained, Selective Orientation Level: Oriented X4 Following Commands: Follows one step commands consistently Safety/Judgement: Decreased awareness of deficits, Decreased awareness of safety General Comments: Pt seems to be improving with safety awareness. He requested use of RW prior to ambulation. Attention: Sustained Sustained Attention: Appears intact Safety/Judgment: Impaired    Extremity Assessment (includes Sensation/Coordination)  Upper Extremity Assessment: Defer to OT evaluation RUE Deficits / Details: Pt demonstrates shoulder flexion to ~60* with increased time and considerable effort.  he is able to peform ~80% hand to mouth, and demonstrates gross grasp/release  RUE Coordination: decreased fine motor, decreased gross motor  Lower Extremity Assessment: RLE deficits/detail RLE Deficits / Details: lifts antigravity with extra time and  effort, limited with function needing assist for L weight shift to progress R LE    ADLs  Overall ADL's : Needs assistance/impaired Eating/Feeding: NPO Grooming: Wash/dry hands, Wash/dry face, Oral care, Brushing hair, Set up,  Sitting Upper Body Bathing: Moderate assistance, Sitting Lower Body Bathing: Moderate assistance, Sit to/from stand Upper Body Dressing : Maximal assistance, Sitting Lower Body Dressing: Maximal assistance, Sit to/from stand Toilet Transfer: Minimal assistance, +2 for safety/equipment, Stand-pivot, BSC, Requires wide/bariatric Toileting- Clothing Manipulation and Hygiene: Moderate assistance, Sit to/from stand Functional mobility during ADLs: Minimal assistance, +2 for safety/equipment, Cueing for safety, Rolling walker    Mobility  Overal bed mobility: Needs Assistance Bed Mobility: Supine to Sit Supine to sit: Mod assist General bed mobility comments: up in chair on arrival    Transfers  Overall transfer level: Needs assistance Equipment used: Rolling walker (2 wheeled) Transfers: Sit to/from Stand Sit to Stand: Min assist Stand pivot transfers: Min assist General transfer comment: performed sit<>stand 12x. 4x normal, 4x from compliant surgace, & 4x with R LE placed at a disadvantage. Each time pt required min A to power up. Intermittant cues for technique.    Ambulation / Gait / Stairs / Wheelchair Mobility  Ambulation/Gait Ambulation/Gait assistance: Herbalist (Feet): 25 Feet Assistive device: Rolling walker (2 wheeled) Gait Pattern/deviations: Decreased stride length, Step-to pattern, Decreased step length - right, Decreased dorsiflexion - right, Shuffle, Trunk flexed General Gait Details: Assist required to place R UE on RW, but able to stay in place once placed. VC for RW proximity. Gait velocity: decreased Gait velocity interpretation: <1.31 ft/sec, indicative of household ambulator    Posture / Balance Dynamic Sitting  Balance Sitting balance - Comments: able to maintain static sitting with supervision after initial assist Balance Overall balance assessment: Needs assistance Sitting-balance support: Feet supported Sitting balance-Leahy Scale: Fair Sitting balance - Comments: able to maintain static sitting with supervision after initial assist Standing balance support: Bilateral upper extremity supported Standing balance-Leahy Scale: Poor Standing balance comment: Pt requires min A and bil. UE support     Special needs/care consideration BiPAP/CPAP no CPM no Continuous Drip IV keppra 450m/hr Dialysis no        Days n/a Life Vest no Oxygen 3L/minute Special Bed no Trach Size no Wound Vac (area) no      Location n/a Skin  Skin tear to R elbow Bowel mgmt:  Bladder mgmt:  Diabetic mgmt: yes Behavioral consideration cognitive deficits, can get agitated but easily redirected Chemo/radiation    Previous Home Environment (from acute therapy documentation) Living Arrangements: Spouse/significant other  Lives With: Spouse Available Help at Discharge: Family, Available 24 hours/day Type of Home: House Home Layout: One level Home Access: Stairs to enter Entrance Stairs-Rails: Right Entrance Stairs-Number of Steps: 4 Bathroom Shower/Tub: Tub/shower unit, CArchitectural technologist Standard Home Care Services: No  Discharge Living Setting Plans for Discharge Living Setting: Patient's home Type of Home at Discharge: House Discharge Home Layout: One level Discharge Home Access: Stairs to enter Entrance Stairs-Rails: Can reach both Entrance Stairs-Number of Steps: 5 Discharge Bathroom Shower/Tub: Tub/shower unit Discharge Bathroom Toilet: Standard Discharge Bathroom Accessibility: Yes How Accessible: Accessible via walker Does the patient have any problems obtaining your medications?: No  Social/Family/Support Systems Patient Roles: Spouse Anticipated Caregiver: pt's spouse, RMerle CirelliAnticipated Caregiver's Contact Information: [phone], [phone] Ability/Limitations of Caregiver: supervision only Caregiver Availability: 24/7 Discharge Plan Discussed with Primary Caregiver: Yes Is Caregiver In Agreement with Plan?: Yes Does Caregiver/Family have Issues with Lodging/Transportation while Pt is in Rehab?: No  Goals/Additional Needs Patient/Family Goal for Rehab: PT/OT/SLP supervision Expected length of stay: 6-10 days Pt/Family Agrees to Admission and willing to participate: Yes Program Orientation Provided & Reviewed with  Pt/Caregiver Including Roles  & Responsibilities: Yes  Decrease burden of Care through IP rehab admission: n/a  Possible need for SNF placement upon discharge: Not anticipated  Patient Condition: I have reviewed medical records from [facility], spoken with CM, and patient and spouse. I met with patient at the bedside for inpatient rehabilitation assessment.  Patient will benefit from ongoing PT, OT and SLP, can actively participate in 3 hours of therapy a day 5 days of the week, and can make measurable gains during the admission.  Patient will also benefit from the coordinated team approach during an Inpatient Acute Rehabilitation admission.  The patient will receive intensive therapy as well as Rehabilitation physician, nursing, social worker, and care management interventions.  Due to safety, skin/wound care, disease management, medication administration, pain management and patient education the patient requires 24 hour a day rehabilitation nursing.  The patient is currently min assist with mobility and basic ADLs.  Discharge setting and therapy post discharge at home is anticipated.  Patient has agreed to participate in the Acute Inpatient Rehabilitation Program and will admit today.  Preadmission Screen Completed By:  Michel Santee, PT, DPT 10/07/2019 1:15  PM ______________________________________________________________________   Discussed status with Dr. Ranell Patrick on 10/07/19  at 1:20 PM  and received approval for admission today.  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 1. Does the need for close, 24 hr/day Medical supervision in concert with the patient's rehab needs make it unreasonable for this patient to be served in a less intensive setting? Yes 2. Co-Morbidities requiring supervision/potential complications: DM1, HTN, heart failure with reduced ejection fracture, prior alcohol/tobacco use 3. Due to bladder management, bowel management, safety, skin/wound care, disease management, medication administration, pain management and patient education, does the patient require 24 hr/day rehab nursing? Yes 4. Does the patient require coordinated care of a physician, rehab nurse, PT, OT, and SLP to address physical and functional deficits in the context of the above medical diagnosis(es)? Yes Addressing deficits in the following areas: balance, endurance, locomotion, strength, transferring, bowel/bladder control, bathing, dressing, feeding, grooming, toileting, cognition, language and psychosocial support 5. Can the patient actively participate in an intensive therapy program of at least 3 hrs of therapy 5 days a week? Yes 6. The potential for patient to make measurable gains while on inpatient rehab is good 7. Anticipated functional outcomes upon discharge from inpatient rehab: supervision PT, supervision OT, modified independent SLP 8. Estimated rehab length of stay to reach the above functional goals is: 12-16 days 9. Anticipated discharge destination: Home 10. Overall Rehab/Functional Prognosis: good   MD Signature: Leeroy Cha, MD

## 2019-10-07 NOTE — Telephone Encounter (Signed)
LM for wife. Schedulers will call her to make an appt

## 2019-10-07 NOTE — Progress Notes (Signed)
Patient arrived to unit, no s/s of distress noted. Patient A&Ox4, wife at bedside. Patient denies any pain or needs at this time. No complications noted at this time.  Audie Clear, LPN

## 2019-10-07 NOTE — Discharge Summary (Addendum)
Name: Geoffrey Benjamin MRN: [id number] DOB: 1941-06-18 79 y.o. 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   aspirin EC 81 MG tablet Take 81 mg by mouth daily.   atorvastatin 80 MG tablet Commonly known as: LIPITOR Take 80 mg by mouth daily.   dexamethasone 4 MG tablet Commonly known as: DECADRON Take 1 tablet (4 mg total) by mouth 3 (three) times daily. What changed: when to take this   dexamethasone 4 MG tablet Commonly known as: DECADRON Take 1 tablet (4 mg total) by mouth 2 (two) times daily. Start taking on: October 14, 2019 What changed: You were already taking a medication with the same name, and this prescription was added. Make sure you understand how and when to take each.   dexamethasone 2 MG tablet Commonly known as: DECADRON Take 1 tablet (2 mg total) by mouth 2 (two) times daily. Start taking on: October 21, 2019 What changed: You were already taking a medication with the same name, and this prescription was added. Make sure you understand how and when to take each.   dexamethasone 2 MG tablet Commonly known as: DECADRON Take 1 tablet (2 mg total) by mouth daily. Start taking on: October 28, 2019 What changed: You were already taking a medication with the same name, and this  prescription was added. Make sure you understand how and when to take each.   dexamethasone 2 MG tablet Commonly known as: DECADRON Take 1 tablet (2 mg total) by mouth every other day. Start taking on: November 04, 2019 What changed: You were already taking a medication with the same name, and this prescription was added. Make sure you understand how and when to take each.   feeding supplement (ENSURE ENLIVE) Liqd Take 237 mLs by mouth 2 (two) times daily between meals.   Fish Oil 1200 MG Caps Take 1,200 mg by mouth daily.   glipiZIDE 5 MG tablet Commonly known as: GLUCOTROL Take 5 mg by mouth daily before breakfast.   hydrochlorothiazide 10 mg/mL Susp Take 0.63 mLs (6.25 mg total) by mouth daily. Start taking on: October 08, 2019   levETIRAcetam 1000 MG/100ML Soln Commonly known as: KEPPRA Inject 100 mLs (1,000 mg total) into the vein every 12 (twelve) hours.   metFORMIN 1000 MG tablet Commonly known as: GLUCOPHAGE Take 1 tablet (1,000 mg total) by mouth 2 (two) times daily with a meal.   mirtazapine 15 MG disintegrating tablet Commonly known as: REMERON SOL-TAB Take 1 tablet (15 mg total) by mouth at bedtime.   nicotine 21 mg/24hr patch Commonly known as: NICODERM CQ - dosed in mg/24 hours Place 1 patch (21 mg total) onto the skin daily. Start taking on: October 08, 2019   oxyCODONE 5 MG immediate release tablet Commonly known as: Oxy IR/ROXICODONE Take  1 tablet (5 mg total) by mouth every 6 (six) hours as needed for moderate pain or severe pain.   pantoprazole 40 MG tablet Commonly known as: PROTONIX Take 1 tablet (40 mg total) by mouth daily. What changed: when to take this   ramelteon 8 MG tablet Commonly known as: ROZEREM Take 1 tablet (8 mg total) by mouth at bedtime.        Disposition and follow-up:   Geoffrey Benjamin was discharged from [facility] in Stable condition.  At the hospital follow up visit please address:   1.  Please  ensure adherence to steroid taper. Please ensure patient and patient's wife have access to resources for anxiety and depression of patient and caregiver in cancer. Please continue goals of care discussions.  2.  Labs / imaging needed at time of follow-up: na  3.  Pending labs/ test needing follow-up: na  Follow-up Appointments:    Hospital Course by problem list:  1. Seizures -recently diagnosed adenocarcinoma of the lung w/ brain metastases s/p SRS on 09/29/2019 on a steroid taper who presented in status epilepticus found to have extensive vasogenic edema within the left cerebral hemisphere likely rebound from decreased steroids.  -treated with multiple doses of benzos, Keppra and Dilantin to resolve  -MRI incidentally demonstrated a small acute infarct in the left centrum semiovale without significant change in known metastases  -Patient underwent stroke work up: Hgb A1c of 7.3 and lipid panel with total cholesterol of 111, triglycerides of 104, low HDL of 29 and elevated LDL of 61 -elevated Hgb A1c likely due to steroid use of the last month which will require stricter management outpatient -patient on atorvastatin 80 mg daily outpatient -ECHO without evidence of cardio embolic stroke source -speech says patient's swallowing function has worsened since last month's admission prompting goals of care discussion with palliative -palliative consulted and patient switched to partial code but expresses frustration and discussion of limited life expectancy as he was told he could have 3-4 years with treatment -PT/OT consulted, recommending CIR -patient with continued trouble sleeping and depressed affect on exam   Plan: -mirtazepine for sleep and mood -continue oral steroids, start taper as instructed by rad onc ----dex 4 mg tid 10/07/2019 - 10/13/2019 ----dex 4 mg bid 10/14/2019 - 10/20/2019 ----dex 2 mg bid 10/21/2019 - 10/27/2019 ----dex 2 mg daily 10/28/2019 - 11/03/2019 ----dex 2 mg  qod 11/04/2019 - 11/14/2019 -stricter DM control outpatient given steroid use -continue home atorvastatin 80 daily -continue oxy 5 q6 prn for pain  Discharge Vitals:   BP 121/70 (BP Location: Right Arm)   Pulse 85   Temp 97.7 F (36.5 C) (Oral)   Resp 20   Ht 6\' 1"  (1.854 m)   Wt 72.4 kg   SpO2 99%   BMI 21.06 kg/m   Pertinent Labs, Studies, and Procedures:   CBC Latest Ref Rng & Units 10/05/2019 10/03/2019 10/02/2019  WBC 4.0 - 10.5 K/uL 11.4(H) 11.1(H) 9.6  Hemoglobin 13.0 - 17.0 g/dL 12.6(L) 12.3(L) 11.7(L)  Hematocrit 39.0 - 52.0 % 37.0(L) 36.6(L) 35.0(L)  Platelets 150 - 400 K/uL 240 245 220   BMP Latest Ref Rng & Units 10/05/2019 10/03/2019 10/02/2019  Glucose 70 - 99 mg/dL 146(H) 92 187(H)  BUN 8 - 23 mg/dL 14 12 15   Creatinine 0.61 - 1.24 mg/dL 0.77 0.79 0.75  Sodium 135 - 145 mmol/L 136 140 137  Potassium 3.5 - 5.1 mmol/L 4.3 4.1 3.6  Chloride 98 - 111 mmol/L 99  103 100  CO2 22 - 32 mmol/L 25 22 24   Calcium 8.9 - 10.3 mg/dL 8.8(L) 8.6(L) 8.1(L)   CT HEAD WITHOUT CONTRAST IMPRESSION: Mildly motion degraded examination.   Extensive vasogenic edema within the left cerebral hemisphere is similar to prior MRI 07/03/2019. Multiple intracranial metastases were better appreciated on this prior study, including a dominant 2.2 cm left parietal lobe metastasis. Redemonstrated mass effect with partial effacement of the left lateral ventricle. 4 mm rightward midline shift may be minimally increased from the prior exam.   No acute intracranial hemorrhage. No definite loss of gray-white Differentiation.  MRI HEAD WITHOUT AND WITH CONTRAST IMPRESSION: 1. 4 mm acute infarct in the left centrum semiovale. 2. Slightly increased size of left parietal metastasis with unchanged extensive vasogenic edema and mass effect. 3. Unchanged subcentimeter metastases elsewhere. 4. No new metastases identified within limitations of motion artifact.  Discharge Instructions: Discharge  Instructions     Diet - low sodium heart healthy   Complete by: As directed    Increase activity slowly   Complete by: As directed        Signed: Al Decant, MD 10/07/2019, 11:02 AM   Pager: [phone]

## 2019-10-07 NOTE — Progress Notes (Addendum)
Occupational Therapy Assessment and Plan  Patient Details  Name: Geoffrey Benjamin MRN: [id number] Date of Birth: 10/03/1941  OT Diagnosis: apraxia, ataxia, cognitive deficits, hemiplegia affecting non-dominant side and muscle weakness (generalized) Rehab Potential: Rehab Potential (ACUTE ONLY): Good ELOS: 12-14 days   Today's Date: 10/08/2019 OT Individual Time: 1100-1200 OT Individual Time Calculation (min): 60 min     Problem List:  Patient Active Problem List   Diagnosis Date Noted  . Aortic atherosclerosis ([place]) 10/07/2019  . Emphysema of lung ([place]) 10/07/2019  . Metastatic adenocarcinoma ([address]) 10/07/2019  . Focal and partial seizures ([place]) 10/01/2019  . Status epilepticus ([place]) 10/01/2019  . Adenocarcinoma of right lung, stage 4 ([place]) 09/05/2019  . Goals of care, counseling/discussion 09/05/2019  . Brain metastases ([place]) 09/02/2019  . Right sided weakness 09/01/2019  . Acute systolic congestive heart failure ([place]) 09/19/2018  . CAP (community acquired pneumonia) 09/13/2018  . Hyperlipidemia 09/13/2018  . Volume overload 09/13/2018  . Tobacco use 09/13/2018  . Aortic stenosis 09/13/2018  . Chronic pain 11/10/2015  . Hypertension 11/10/2015  . Tobacco dependence 11/10/2015  . GERD 06/04/2009  . PERSONAL HX COLONIC POLYPS 06/04/2009  . LOSS OF WEIGHT 09/29/2008  . RLQ PAIN 09/29/2008  . Type 2 diabetes mellitus ([place]) 09/22/2008    Past Medical History:  Past Medical History:  Diagnosis Date  . Aortic stenosis 09/13/2018  . CAP (community acquired pneumonia) 09/13/2018  . CHF (congestive heart failure) ([place])   . Difficulty walking   . DM (diabetes mellitus) ([place])   . GERD 06/04/2009   Qualifier: Diagnosis of  By: Henrene Pastor MD, Docia Chuck   . Hyperlipidemia 09/13/2018  . Hypertension   . Loss of weight 09/29/2008   Qualifier: Diagnosis of  By: Bobby Rumpf CMA (AAMA), [place]    . Non-small cell carcinoma of right lung, stage 4 ([place])   . PERSONAL HX COLONIC POLYPS 06/04/2009    Qualifier: Diagnosis of  By: Henrene Pastor MD, [place] PAIN 09/29/2008   Qualifier: Diagnosis of  By: Bobby Rumpf CMA (AAMA), Pat[place]    . Tachycardia   . Tobacco use 09/13/2018  . Volume overload 09/13/2018   Past Surgical History:  Past Surgical History:  Procedure Laterality Date  . CATARACT EXTRACTION, BILATERAL  08/2016  . DOPPLER ECHOCARDIOGRAPHY     Mitral inflow and tissue doppler consistent with impaired LV relaxation; Trileaflet aortic vlave with moderate aortic valve stenosis, Trivial mitral and tricuspid valve regurgitation.    Assessment & Plan Clinical Impression: Patient is a 79 y.o. 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 Earlie Server and Dr. Lisbeth Renshaw at the [facility] center. 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 Per chart review patient lives with spouse. 1 level home 4 steps to entry. He did use a rolling walker at times. Presented 10/01/2019 with repetitive twitching and jerking-like movements to the right upper extremity and right facial twitching. Patient did receive 7.5 mg of Versed on route to the ED with noticeable improvement. Cranial CT scan showed extensive vasogenic edema within the left cerebral hemisphere similar to prior MRI on 07/03/2019. Multiple intracranial metastasis including a dominant 2.2 cm left parietal lobe metastasis. Redemonstrated mass-effect with partial effacement of the left lateral ventricle. 4 mm rightward midline shift. No acute intracranial hemorrhage. EEG  showed evidence of focal convulsive status epilepticus arising from left hemisphere at the beginning of the study as well as cortical dysfunction likely secondary to underlying metastatic disease. Follow-up MRI shows 4 mm acute infarct  in the left centrum semiovale slightly increased size a left parietal metastasis with unchanged extensive vasogenic edema. No new mets identified within limitations of motion artifact. Currently maintained on Eliquis for CVA prophylaxis. Echocardiogram with ejection fraction of 65%. Patient was loaded with Keppra as well as Decadron taper as indicated. Palliative care consulted to establish goals of care. Oncology service Dr. Julien Nordmann recommends follow-up outpatient. Tolerating mechanical soft diet.  Patient transferred to CIR on 10/07/2019 .    Patient currently requires mod with basic self-care skills secondary to muscle weakness, decreased cardiorespiratoy endurance, impaired timing and sequencing, abnormal tone, unbalanced muscle activation, motor apraxia, ataxia, decreased coordination and decreased motor planning, decreased midline orientation, decreased attention to right, right side neglect and decreased motor planning, decreased initiation, decreased attention, decreased awareness, decreased safety awareness and decreased memory and decreased sitting balance, decreased standing balance, decreased postural control, hemiplegia and decreased balance strategies.  Prior to hospitalization, patient could complete BADL with Supervision/Mod I.  Patient will benefit from skilled intervention to increase independence with basic self-care skills prior to discharge home with care partner.  Anticipate patient will require 24 hour supervision and follow up home health.  OT - End of Session Endurance Deficit: Yes Endurance Deficit Description: multiple rest breaks within BADL tasks OT Assessment Rehab Potential (ACUTE ONLY): Good OT Patient demonstrates impairments in the following area(s): Balance;Cognition;Endurance;Motor;Pain;Perception;Safety;Sensory OT Basic ADL's Functional Problem(s): Eating;Grooming;Dressing;Bathing;Toileting OT Transfers Functional Problem(s): Toilet;Tub/Shower OT Additional  Impairment(s): Fuctional Use of Upper Extremity OT Plan OT Intensity: Minimum of 1-2 x/day, 45 to 90 minutes OT Frequency: 5 out of 7 days OT Duration/Estimated Length of Stay: 12-14 days OT Treatment/Interventions: Balance/vestibular training;Cognitive remediation/compensation;Community reintegration;Discharge planning;Disease mangement/prevention;DME/adaptive equipment instruction;Functional mobility training;Functional electrical stimulation;Neuromuscular re-education;Pain management;Patient/family education;Self Care/advanced ADL retraining;Therapeutic Activities;Therapeutic Exercise;UE/LE Coordination activities;UE/LE Strength taining/ROM;Wheelchair propulsion/positioning OT Self Feeding Anticipated Outcome(s): Mod I OT Basic Self-Care Anticipated Outcome(s): Supervision OT Toileting Anticipated Outcome(s): Supervision OT Bathroom Transfers Anticipated Outcome(s): Supervision OT Recommendation Recommendations for Other Services: Neuropsych consult Patient destination: Home Follow Up Recommendations: Home health OT Equipment Recommended: To be determined   Skilled Therapeutic Intervention Initial eval completed with treatment provided to address functional transfers, functional use of R UE, improved sit<>stand, standing tolerance, and adapted bathing/dressing skills. Stand-pivot transfers bed>wc with mod A. Min/mod A stand-pivot to shower chair.. Incorporated R NMR weight bearing techniques within bathing tasks with hand over hand A to grasp wash cloth. Min A for balance when reaching to wash buttocks. LB/UB dressing completed wc level at the sink with assistance to thread R LE into clothing and assist to pull up pants. UB dressing with mod A overall 2/2 R hemiplegia. Incorporate weight bearing through R UE when standing at the sink. Pt left seated in recliner with alarm belt on, call bell in reach, and needs met.     OT Evaluation Precautions/Restrictions  Precautions Precautions:  Fall Precaution Comments: R inattention Restrictions Weight Bearing Restrictions: No Pain  Pt reports pain all over- soreness, no number given. Repositioned and rest for comfort. Home Living/Prior Functioning Home Living Family/patient expects to be discharged to:: Private residence Living Arrangements: Spouse/significant other Available Help at Discharge: Family, Available 24 hours/day Type of Home: House Home Access: Stairs to enter [employer] of Steps: 4 Entrance Stairs-Rails: Right Home Layout: One level Bathroom Shower/Tub: Tub/shower  unit, Architectural technologist: Standard Additional Comments: Has a RW only   Lives With: Spouse IADL History Current License: No Prior Function Driving: No Comments: Retired Product manager. Likes to go to [employer] and go fishing ADL ADL Eating: Set up Grooming: Minimal assistance, Setup Upper Body Bathing: Moderate assistance Lower Body Bathing: Moderate assistance Upper Body Dressing: Moderate assistance Lower Body Dressing: Moderate assistance Toileting: Maximal assistance Toilet Transfer: Moderate assistance Tub/Shower Transfer: Moderate assistance Vision Baseline Vision/History: Wears glasses(per patient report) Perception  Perception: Impaired Inattention/Neglect: Does not attend to right side of body;Does not attend to right visual field Praxis Praxis: Impaired Cognition Overall Cognitive Status: Impaired/Different from baseline Arousal/Alertness: Awake/alert Orientation Level: Person;Situation;Place Person: Oriented Place: Oriented Situation: Oriented Year: 2021 Month: January Day of Week: Correct Memory: Impaired Immediate Memory Recall: Sock;Blue;Bed Memory Recall Sock: With Cue Memory Recall Blue: Without Cue Memory Recall Bed: Not able to recall Safety/Judgment: Impaired Sensation Sensation Light Touch: Impaired Detail Light Touch Impaired Details: Impaired RUE;Impaired  RLE Coordination Gross Motor Movements are Fluid and Coordinated: No Fine Motor Movements are Fluid and Coordinated: No Coordination and Movement Description: decreased smoothness and accuracy  Finger Nose Finger Test: R dysmetria Motor  Motor Motor: Hemiplegia Motor - Skilled Clinical Observations: R hemiplegia Mobility  Transfers Sit to Stand: Minimal Assistance - Patient > 75% Stand to Sit: Minimal Assistance - Patient > 75%  Balance Balance Balance Assessed: Yes Static Sitting Balance Static Sitting - Balance Support: Feet supported Static Sitting - Level of Assistance: 5: Stand by assistance Dynamic Sitting Balance Dynamic Sitting - Balance Support: Feet supported Dynamic Sitting - Level of Assistance: 4: Min assist Static Standing Balance Static Standing - Balance Support: During functional activity Static Standing - Level of Assistance: 4: Min assist Dynamic Standing Balance Dynamic Standing - Balance Support: During functional activity Dynamic Standing - Level of Assistance: 4: Min assist;3: Mod assist Extremity/Trunk Assessment RUE Assessment RUE Assessment: Exceptions to [facility] RUE Body System: Ortho;Neuro Brunstrum levels for arm and hand: Hand;Arm Brunstrum level for arm: Stage III Synergy is performed voluntarily Brunstrum level for hand: Stage III Synergies performed voluntarily RUE Strength Right Shoulder Flexion: 2-/5 LUE Assessment LUE Assessment: Within Functional Limits   Refer to Care Plan for Long Term Goals  Recommendations for other services: Neuropsych  Discharge Criteria: Patient will be discharged from OT if patient refuses treatment 3 consecutive times without medical reason, if treatment goals not met, if there is a change in medical status, if patient makes no progress towards goals or if patient is discharged from hospital.  The above assessment, treatment plan, treatment alternatives and goals were discussed and mutually agreed upon: by  patient  Valma Cava 10/08/2019, 12:54 PM

## 2019-10-07 NOTE — Telephone Encounter (Signed)
Wife states Geoffrey Benjamin has been hospitalized since 12/29 with brain tumor and lung cancer. Plan is to be discharged to rehab. Wants to know what is is next after rehab?

## 2019-10-07 NOTE — Progress Notes (Signed)
PMR Admission Coordinator Pre-Admission Assessment  Patient: Carrie M Bentley is an 79 y.o., male MRN: [id number] DOB: 10/03/1940 Height: 6' 1" (185.4 cm) Weight: 72.4 kg  Insurance Information HMO:     PPO:      PCP:      IPA:      80/20:      OTHER:  PRIMARY: Medicare A and B      Policy#: [id number]      Subscriber: patient CM Name:       Phone#:      Fax#:  Pre-Cert#: verified online      Employer: n/a Benefits:  Phone #:      Name:  Eff. Date: 08/03/2008     Deduct: $1484      Out of Pocket Max: n/a      Life Max: n/a CIR: 100%      SNF: 20 full days Outpatient: 80%     Co-Pay: 20% Home Health: 100%      Co-Pay:  DME: 80%     Co-Pay: 20% Providers:  SECONDARY:       Policy#:       Subscriber:  CM Name:       Phone#:      Fax#:  Pre-Cert#:       Employer:  Benefits:  Phone #:      Name:  Eff. Date:      Deduct:       Out of Pocket Max:       Life Max:  CIR:       SNF:  Outpatient:      Co-Pay:  Home Health:       Co-Pay:  DME:      Co-Pay:   Medicaid Application Date:       Case Manager:  Disability Application Date:       Case Worker:   The "Data Collection Information Summary" for patients in Inpatient Rehabilitation Facilities with attached "Privacy Act Statement-Health Care Records" was provided and verbally reviewed with: Patient and Family  Emergency Contact Information Contact Information    Name Relation Home Work Mobile   Cassis,Rebecca Spouse [phone]  [phone]   Hope, Ames    [phone]   Charpentier, Kimberly Kay    [phone]      Current Medical History  Patient Admitting Diagnosis: 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 Mohammed and Dr. Moody at the [facility].   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  Presented 10/01/2019 with repetitive twitching and jerking-like movements to the right upper extremity and right facial twitching.  Patient did receive 7.5 mg of Versed on route to the ED with noticeable improvement.  Cranial CT scan showed extensive vasogenic edema within the left cerebral hemisphere similar to prior MRI on 07/03/2019.  Multiple intracranial metastasis including a dominant 2.2 cm left parietal lobe metastasis.  Redemonstrated mass-effect with partial effacement of the left lateral ventricle.  4 mm rightward midline shift.  No acute intracranial hemorrhage.  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  No new mets identified within limitations of motion artifact.  Currently maintained on Eliquis for CVA prophylaxis.  Echocardiogram with ejection fraction of 65%.  Patient was loaded with Keppra as well as Decadron taper as indicated.  Palliative care consulted to establish goals of care.  Oncology service Dr. Mohamed recommends follow-up outpatient.  Tolerating mechanical soft diet.  Therapy evaluations completed and patient was recommended for a comprehensive rehab program.  Complete NIHSS TOTAL: 7  Patient's medical record from [facility] has been reviewed by the rehabilitation admission coordinator and physician.  Past Medical History  Past Medical History:  Diagnosis Date  . Aortic stenosis 09/13/2018  . CAP (community acquired pneumonia) 09/13/2018  . CHF (congestive heart failure) (HCC)   . Difficulty  walking   . DM (diabetes mellitus) (HCC)   . GERD 06/04/2009   Qualifier: Diagnosis of  By: Perry MD, Alfonzia N   . Hyperlipidemia 09/13/2018  . Hypertension   . Loss of weight 09/29/2008   Qualifier: Diagnosis of  By: Lewis CMA (AAMA), Patty    . Non-small cell carcinoma of right lung, stage 4 (HCC)   . PERSONAL HX COLONIC POLYPS 06/04/2009   Qualifier: Diagnosis of  By: Perry MD, Rolland N   . RLQ PAIN 09/29/2008   Qualifier: Diagnosis of  By: Lewis CMA (AAMA), Patty    . Tachycardia   . Tobacco use 09/13/2018  . Volume overload 09/13/2018    Family History   family history includes Kidney disease in his father; Stroke in his mother.  Prior Rehab/Hospitalizations Has the patient had prior rehab or hospitalizations prior to admission? No  Has the patient had major surgery during 100 days prior to admission? No   Current Medications  Current Facility-Administered Medications:  .   stroke: mapping our early stages of recovery book, , Does not apply, Once, Arora, Ashish, MD .  0.9 % NaCl with KCl 40 mEq / L  infusion, , Intravenous, Continuous, Agyei, Obed K, MD, Last Rate: 75 mL/hr at 10/06/19 1118, 75 mL/hr at 10/06/19 1118 .  apixaban (ELIQUIS) tablet 5 mg, 5 mg, Oral, BID, Agyei, Obed K, MD, 5 mg at 10/07/19 0856 .  atorvastatin (LIPITOR) tablet 80 mg, 80 mg, Oral, q1800, Lanier, Claire, MD, 80 mg at 10/06/19 1654 .  dexamethasone (DECADRON) tablet 4 mg, 4 mg, Oral, TID, 4 mg at 10/07/19 1036 **FOLLOWED BY** [START ON 10/14/2019] dexamethasone (DECADRON) tablet 4 mg, 4 mg, Oral, BID **FOLLOWED BY** [START ON 10/21/2019] dexamethasone (DECADRON) tablet 2 mg, 2 mg, Oral, BID **FOLLOWED BY** [START ON 10/28/2019] dexamethasone (DECADRON) tablet 2 mg, 2 mg, Oral, Daily **FOLLOWED BY** [START ON 11/04/2019] dexamethasone (DECADRON) tablet 2 mg, 2 mg, Oral, QODAY, Hoffman, Erik C, DO .  feeding supplement (ENSURE ENLIVE) (ENSURE ENLIVE) liquid 237 mL, 237 mL, Oral, BID BM, Hoffman, Erik C, DO, 237 mL  at 10/07/19 0854 .  guaiFENesin-dextromethorphan (ROBITUSSIN DM) 100-10 MG/5ML syrup 5 mL, 5 mL, Oral, Q4H PRN, Hoffman, Erik C, DO, 5 mL at 10/06/19 0837 .  hydrochlorothiazide 10 mg/mL oral suspension 6.25 mg, 6.25 mg, Oral, Daily, Agyei, Obed K, MD, 6.25 mg at 10/07/19 1036 .  ibuprofen (ADVIL) tablet 200 mg, 200 mg, Oral, BID PRN, Agyei, Obed K, MD, 200 mg at 10/07/19 0909 .  insulin aspart (novoLOG) injection 0-9 Units, 0-9 Units, Subcutaneous, TID   WC, Agyei, Obed K, MD, 3 Units at 10/07/19 1244 .  levETIRAcetam (KEPPRA) IVPB 1000 mg/100 mL premix, 1,000 mg, Intravenous, Q12H, Arora, Ashish, MD, Last Rate: 400 mL/hr at 10/07/19 0903, 1,000 mg at 10/07/19 0903 .  loratadine (CLARITIN) tablet 10 mg, 10 mg, Oral, Daily PRN, Hoffman, Erik C, DO, 10 mg at 10/04/19 1850 .  LORazepam (ATIVAN) injection 0.5 mg, 0.5 mg, Intravenous, Q4H PRN, Golding, Elizabeth L, DO .  LORazepam (ATIVAN) injection 1 mg, 1 mg, Intravenous, Once, Agyei, Obed K, MD, Stopped at 10/01/19 1319 .  mirtazapine (REMERON SOL-TAB) disintegrating tablet 15 mg, 15 mg, Oral, QHS, Lanier, Claire, MD .  nicotine (NICODERM CQ - dosed in mg/24 hours) patch 21 mg, 21 mg, Transdermal, Daily, Golding, Elizabeth L, DO, 21 mg at 10/07/19 0857 .  oxyCODONE (Oxy IR/ROXICODONE) immediate release tablet 5 mg, 5 mg, Oral, Q6H PRN, Lanier, Claire, MD, 5 mg at 10/07/19 0909 .  ramelteon (ROZEREM) tablet 8 mg, 8 mg, Oral, QHS, Basaraba, Iulia, MD, 8 mg at 10/06/19 2239 .  sodium chloride (OCEAN) 0.65 % nasal spray 1 spray, 1 spray, Each Nare, PRN, Hoffman, Erik C, DO, 1 spray at 10/05/19 1246  Patients Current Diet:  Diet Order            Diet - low sodium heart healthy        DIET DYS 3 Room service appropriate? Yes with Assist; Fluid consistency: Thin  Diet effective now              Precautions / Restrictions Precautions Precautions: Fall Precaution Comments: R inattention Restrictions Weight Bearing Restrictions: No   Has the  patient had 2 or more falls or a fall with injury in the past year? Yes  Prior Activity Level Limited Community (1-2x/wk): not driving, using AD for ambulation (cane? vs walker?)   Prior Functional Level Self Care: Did the patient need help bathing, dressing, using the toilet or eating? Independent  Indoor Mobility: Did the patient need assistance with walking from room to room (with or without device)? Independent  Stairs: Did the patient need assistance with internal or external stairs (with or without device)? Independent  Functional Cognition: Did the patient need help planning regular tasks such as shopping or remembering to take medications? Needed some help  Home Assistive Devices / Equipment Home Assistive Devices/Equipment: None Home Equipment: Walker - 2 wheels  Prior Device Use: Indicate devices/aids used by the patient prior to current illness, exacerbation or injury? Walker  Current Functional Level Cognition  Arousal/Alertness: Awake/alert Overall Cognitive Status: Impaired/Different from baseline Current Attention Level: Sustained, Selective Orientation Level: Oriented X4 Following Commands: Follows one step commands consistently Safety/Judgement: Decreased awareness of deficits, Decreased awareness of safety General Comments: Pt seems to be improving with safety awareness. He requested use of RW prior to ambulation. Attention: Sustained Sustained Attention: Appears intact Safety/Judgment: Impaired    Extremity Assessment (includes Sensation/Coordination)  Upper Extremity Assessment: Defer to OT evaluation RUE Deficits / Details: Pt demonstrates shoulder flexion to ~60* with increased time and considerable effort.  he is able to peform ~80% hand to mouth, and demonstrates gross grasp/release  RUE Coordination: decreased fine motor, decreased gross motor  Lower Extremity Assessment: RLE deficits/detail RLE Deficits / Details: lifts antigravity with extra time and  effort, limited with function needing assist for L weight shift to progress R LE    ADLs  Overall ADL's : Needs assistance/impaired Eating/Feeding: NPO Grooming: Wash/dry hands, Wash/dry face, Oral care, Brushing hair, Set up,   Sitting Upper Body Bathing: Moderate assistance, Sitting Lower Body Bathing: Moderate assistance, Sit to/from stand Upper Body Dressing : Maximal assistance, Sitting Lower Body Dressing: Maximal assistance, Sit to/from stand Toilet Transfer: Minimal assistance, +2 for safety/equipment, Stand-pivot, BSC, Requires wide/bariatric Toileting- Clothing Manipulation and Hygiene: Moderate assistance, Sit to/from stand Functional mobility during ADLs: Minimal assistance, +2 for safety/equipment, Cueing for safety, Rolling walker    Mobility  Overal bed mobility: Needs Assistance Bed Mobility: Supine to Sit Supine to sit: Mod assist General bed mobility comments: up in chair on arrival    Transfers  Overall transfer level: Needs assistance Equipment used: Rolling walker (2 wheeled) Transfers: Sit to/from Stand Sit to Stand: Min assist Stand pivot transfers: Min assist General transfer comment: performed sit<>stand 12x. 4x normal, 4x from compliant surgace, & 4x with R LE placed at a disadvantage. Each time pt required min A to power up. Intermittant cues for technique.    Ambulation / Gait / Stairs / Wheelchair Mobility  Ambulation/Gait Ambulation/Gait assistance: Min assist Gait Distance (Feet): 25 Feet Assistive device: Rolling walker (2 wheeled) Gait Pattern/deviations: Decreased stride length, Step-to pattern, Decreased step length - right, Decreased dorsiflexion - right, Shuffle, Trunk flexed General Gait Details: Assist required to place R UE on RW, but able to stay in place once placed. VC for RW proximity. Gait velocity: decreased Gait velocity interpretation: <1.31 ft/sec, indicative of household ambulator    Posture / Balance Dynamic Sitting  Balance Sitting balance - Comments: able to maintain static sitting with supervision after initial assist Balance Overall balance assessment: Needs assistance Sitting-balance support: Feet supported Sitting balance-Leahy Scale: Fair Sitting balance - Comments: able to maintain static sitting with supervision after initial assist Standing balance support: Bilateral upper extremity supported Standing balance-Leahy Scale: Poor Standing balance comment: Pt requires min A and bil. UE support     Special needs/care consideration BiPAP/CPAP no CPM no Continuous Drip IV keppra 400mL/hr Dialysis no        Days n/a Life Vest no Oxygen 3L/minute Special Bed no Trach Size no Wound Vac (area) no      Location n/a Skin  Skin tear to R elbow Bowel mgmt:  Bladder mgmt:  Diabetic mgmt: yes Behavioral consideration cognitive deficits, can get agitated but easily redirected Chemo/radiation    Previous Home Environment (from acute therapy documentation) Living Arrangements: Spouse/significant other  Lives With: Spouse Available Help at Discharge: Family, Available 24 hours/day Type of Home: House Home Layout: One level Home Access: Stairs to enter Entrance Stairs-Rails: Right Entrance Stairs-Number of Steps: 4 Bathroom Shower/Tub: Tub/shower unit, Curtain Bathroom Toilet: Standard Home Care Services: No  Discharge Living Setting Plans for Discharge Living Setting: Patient's home Type of Home at Discharge: House Discharge Home Layout: One level Discharge Home Access: Stairs to enter Entrance Stairs-Rails: Can reach both Entrance Stairs-Number of Steps: 5 Discharge Bathroom Shower/Tub: Tub/shower unit Discharge Bathroom Toilet: Standard Discharge Bathroom Accessibility: Yes How Accessible: Accessible via walker Does the patient have any problems obtaining your medications?: No  Social/Family/Support Systems Patient Roles: Spouse Anticipated Caregiver: pt's spouse, Rebecca  Hartinger Anticipated Caregiver's Contact Information: [phone] C, [phone] h Ability/Limitations of Caregiver: supervision only Caregiver Availability: 24/7 Discharge Plan Discussed with Primary Caregiver: Yes Is Caregiver In Agreement with Plan?: Yes Does Caregiver/Family have Issues with Lodging/Transportation while Pt is in Rehab?: No  Goals/Additional Needs Patient/Family Goal for Rehab: PT/OT/SLP supervision Expected length of stay: 6-10 days Pt/Family Agrees to Admission and willing to participate: Yes Program Orientation Provided & Reviewed with   Pt/Caregiver Including Roles  & Responsibilities: Yes  Decrease burden of Care through IP rehab admission: n/a  Possible need for SNF placement upon discharge: Not anticipated  Patient Condition: I have reviewed medical records from [facility], spoken with CM, and patient and spouse. I met with patient at the bedside for inpatient rehabilitation assessment.  Patient will benefit from ongoing PT, OT and SLP, can actively participate in 3 hours of therapy a day 5 days of the week, and can make measurable gains during the admission.  Patient will also benefit from the coordinated team approach during an Inpatient Acute Rehabilitation admission.  The patient will receive intensive therapy as well as Rehabilitation physician, nursing, social worker, and care management interventions.  Due to safety, skin/wound care, disease management, medication administration, pain management and patient education the patient requires 24 hour a day rehabilitation nursing.  The patient is currently min assist with mobility and basic ADLs.  Discharge setting and therapy post discharge at home is anticipated.  Patient has agreed to participate in the Acute Inpatient Rehabilitation Program and will admit today.  Preadmission Screen Completed By:  Calyx Hawker E Lene Mckay, PT, DPT 10/07/2019 1:15  PM ______________________________________________________________________   Discussed status with Dr. Raulkar on 10/07/19  at 1:20 PM  and received approval for admission today.  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 1. Does the need for close, 24 hr/day Medical supervision in concert with the patient's rehab needs make it unreasonable for this patient to be served in a less intensive setting? Yes 2. Co-Morbidities requiring supervision/potential complications: DM1, HTN, heart failure with reduced ejection fracture, prior alcohol/tobacco use 3. Due to bladder management, bowel management, safety, skin/wound care, disease management, medication administration, pain management and patient education, does the patient require 24 hr/day rehab nursing? Yes 4. Does the patient require coordinated care of a physician, rehab nurse, PT, OT, and SLP to address physical and functional deficits in the context of the above medical diagnosis(es)? Yes Addressing deficits in the following areas: balance, endurance, locomotion, strength, transferring, bowel/bladder control, bathing, dressing, feeding, grooming, toileting, cognition, language and psychosocial support 5. Can the patient actively participate in an intensive therapy program of at least 3 hrs of therapy 5 days a week? Yes 6. The potential for patient to make measurable gains while on inpatient rehab is good 7. Anticipated functional outcomes upon discharge from inpatient rehab: supervision PT, supervision OT, modified independent SLP 8. Estimated rehab length of stay to reach the above functional goals is: 12-16 days 9. Anticipated discharge destination: Home 10. Overall Rehab/Functional Prognosis: good   MD Signature: Krutika Raulkar, MD 

## 2019-10-07 NOTE — Progress Notes (Signed)
Report called to CIR for patient transfer.  Patient and wife in agreement with transfer.  Patient will be transported to [address] via wheelchair accompanied by wife and belongings.

## 2019-10-07 NOTE — Progress Notes (Signed)
Inpatient Rehab Admissions:  Inpatient Rehab Consult received.  I met with pt at the bedside for rehabilitation assessment and to discuss goals and expectations of an inpatient rehab admission.  I also spoke to pt's wife.  Both are in agreement for CIR, however pt would like to wait one more day before transferring.  I explained to pt that he was medically ready and that therapy would not start till tomorrow.  He seemed resigned to that fact and agreeable to come.  I have approval from Dr. Madilyn Fireman and a bed available today.  I will plan to admit.   Signed: Shann Medal, PT, DPT Admissions Coordinator [phone] 10/07/19  10:53 AM

## 2019-10-07 NOTE — Telephone Encounter (Signed)
We can cancel

## 2019-10-07 NOTE — Telephone Encounter (Signed)
Dr Madilyn Fireman,  This patient is currently hospitalized-order for IV Keppra was sent to [facility].  Note from pharmacy requesting clarification-"Oral solution vs IV" . If medication was sent to pharmacy in error-please discontinue. ..Marland KitchenDespina Hidden Cassady1/4/202112:25 PM

## 2019-10-07 NOTE — Telephone Encounter (Signed)
He will need a follow-up appointment with me in around 2 weeks.  Please send scheduling message.  Thank you.

## 2019-10-07 NOTE — Progress Notes (Addendum)
Subjective: Geoffrey Benjamin was examined at bedside . Pt states he would like to sit up in the bedside chair. He also says he still isn't sleepign well. We told him we can start a mood and sleep medication. Pt expects to go to inpatient rehab soon. All questions and concerns were addressed. No additional complaints at this time.    Consults: neuro  Objective:  Vital signs in last 24 hours: Vitals:   10/06/19 2249 10/07/19 0008 10/07/19 0400 10/07/19 0837  BP: 140/68   121/70  Pulse: 83 72  85  Resp: 20   20  Temp: 97.6 F (36.4 C)   97.7 F (36.5 C)  TempSrc: Oral   Oral  SpO2: (!) 89% 94%  99%  Weight:   72.4 kg   Height:       Physical Exam  Constitutional:  Chronically ill, thin appearing male  Cardiovascular: Normal rate and regular rhythm.  Murmur heard. Pulmonary/Chest: Effort normal. No respiratory distress.  Abdominal: Soft.  thin  Neurological: He is alert.  Able to ambulate to chair with assistance x2  Skin: Skin is warm and dry. No erythema.  Psychiatric:  Depressed mood  Nursing note and vitals reviewed.  I/Os:  Intake/Output Summary (Last 24 hours) at 10/07/2019 0915 Last data filed at 10/07/2019 0600 Gross per 24 hour  Intake 1733.32 ml  Output 2200 ml  Net -466.68 ml   Labs: Results for orders placed or performed during the hospital encounter of 10/01/19 (from the past 24 hour(s))  Glucose, capillary     Status: Abnormal   Collection Time: 10/06/19 11:47 AM  Result Value Ref Range   Glucose-Capillary 179 (H) 70 - 99 mg/dL  Glucose, capillary     Status: Abnormal   Collection Time: 10/06/19  3:55 PM  Result Value Ref Range   Glucose-Capillary 208 (H) 70 - 99 mg/dL  Glucose, capillary     Status: Abnormal   Collection Time: 10/06/19  9:16 PM  Result Value Ref Range   Glucose-Capillary 175 (H) 70 - 99 mg/dL  Glucose, capillary     Status: Abnormal   Collection Time: 10/07/19  8:34 AM  Result Value Ref Range   Glucose-Capillary 171 (H) 70 - 99 mg/dL     Assessment/Plan:  Assessment: Geoffrey Benjamin is a 79 yo M w/ a hx of tobacco use (90 pack years), alcohol use, HTN, T2DM, HFrEF (EF 30-35% 09/2918) and recently diagnosed adenocarcinoma of the lung w/ brain metastases s/p SRS on 09/29/2019 on a steroid taper who presented in breakthrough status epilepticus found to have extensive vasogenic edema within the left cerebral hemisphere likely rebound from steroid taper.   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  -treated with multiple doses of benzos, Keppra and Dilantin to resolve  -neuro following and recommends continued dex, continued Keppra 1 g BID  -MRI incidentally demonstrated a small acute infarct in the left centrum semiovale without significant change in known metastases  -Neuro recommended stroke work up: Hgb A1c of 7.3 and lipid panel with total cholesterol of 111, triglycerides of 104, low HDL of 29 and elevated LDL of 61 -elevated Hgb A1c likely due to steroid use of the last month which will require stricter management outpatient -patient on  atorvastatin 80 mg daily outpatient -speech says patient's swallowing function has worsened since last month's admission -PT/OT consulted, recommending CIR and consult placed -palliative consulted and patient switched to partial code but expresses frustration and discussion of limited life expectancy as he was told he could have 3-4 years with treatment -ECHO without evidence of cardio embolic stroke source -patient alert and oriented today, reports continued trouble sleeping; pt has depressed affect on exam, discussed consulting pastoral care and he would like to hold off at this time  Plan:  -start mirtazepine for sleep and mood -continue oral steroids, start taper as instructed by rad onc ----dex 4 mg tid 10/07/2019 - 10/13/2019 ----dex 4 mg bid 10/14/2019 - 10/20/2019 ----dex 2 mg bid 10/21/2019 - 10/27/2019 ----dex 2 mg daily 10/28/2019 - 11/03/2019 ----dex 2 mg qod 11/04/2019 - 11/14/2019 -stricter DM control outpatient given steroid use -continue home atorvastatin 80 daily -continue oxy 5 q6 prn for pain -fu CIR consult recs  T2DM: -SSI  Plan: -close outpatient fu given increased A1c in setting of steroid use   Dispo: Anticipated discharge pending clinical course.  Al Decant, MD 10/07/2019, 9:15 AM Pager: [phone]

## 2019-10-07 NOTE — H&P (Signed)
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 Earlie Server and Dr. Lisbeth Renshaw at the [facility].  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  Per chart review patient lives with spouse.  1 level home 4 steps to entry.  He did use a rolling walker at times.  Presented 10/01/2019 with repetitive twitching and jerking-like movements to the right upper extremity and right facial twitching.  Patient did receive 7.5 mg of Versed on route to the ED with noticeable improvement.  Cranial CT scan showed extensive vasogenic edema within the left cerebral hemisphere similar to prior MRI on 07/03/2019.  Multiple intracranial metastasis including a dominant 2.2 cm left parietal lobe metastasis.  Redemonstrated mass-effect with partial effacement of the left lateral ventricle.  4 mm rightward midline shift.  No acute intracranial hemorrhage.  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  No new mets identified within limitations of motion artifact.  Currently maintained on Eliquis for CVA prophylaxis.  Echocardiogram with ejection fraction of 65%.  Patient was loaded with Keppra as well as Decadron taper as indicated.  Palliative care consulted to establish goals of care.  Oncology service Dr.  Julien Nordmann recommends follow-up outpatient.  Tolerating mechanical soft diet.  Therapy evaluations completed and patient was admitted for a comprehensive rehab program.  Review of Systems  Constitutional: Negative for chills and fever.  HENT: Negative for hearing loss.   Eyes: Negative for blurred vision and double vision.  Respiratory: Positive for shortness of breath.   Cardiovascular: Positive for leg swelling.  Gastrointestinal: Positive for constipation. Negative for heartburn, nausea and vomiting.       GERD  Genitourinary: Negative for dysuria.  Musculoskeletal: Positive for joint pain and myalgias.  Skin: Negative for rash.  Neurological: Positive for seizures.  Psychiatric/Behavioral: Positive for depression. The patient has insomnia.   All other systems reviewed and are negative.  Past Medical History:  Diagnosis Date  . Aortic stenosis 09/13/2018  . CAP (community acquired pneumonia) 09/13/2018  . CHF (congestive heart failure) ([place])   . Difficulty walking   . DM (diabetes mellitus) ([place])   . GERD 06/04/2009   Qualifier: Diagnosis of  By: Henrene Pastor MD, Docia Chuck   . Hyperlipidemia 09/13/2018  . Hypertension   . Loss of weight 09/29/2008   Qualifier: Diagnosis of  By: Bobby Rumpf CMA (AAMA), [place]    . Non-small cell carcinoma of right lung, stage 4 ([place])   . PERSONAL HX COLONIC POLYPS 06/04/2009   Qualifier: Diagnosis of  By: Henrene Pastor MD, [place] PAIN 09/29/2008   Qualifier: Diagnosis of  By: Bobby Rumpf CMA (AAMA), Patty    . Tachycardia   . Tobacco use 09/13/2018  . Volume overload 09/13/2018   Past Surgical History:  Procedure Laterality Date  . CATARACT EXTRACTION, BILATERAL  08/2016  . DOPPLER ECHOCARDIOGRAPHY     Mitral  inflow and tissue doppler consistent with impaired LV relaxation; Trileaflet aortic vlave with moderate aortic valve stenosis, Trivial mitral and tricuspid valve regurgitation.   Family History  Problem Relation Age of Onset  . Kidney disease Father   .  Stroke Mother    Social History:  reports that he has been smoking cigarettes. He has been smoking about 1.50 packs per day. He has never used smokeless tobacco. He reports current alcohol use. He reports that he does not use drugs. Allergies: No Known Allergies Medications Prior to Admission  Medication Sig Dispense Refill  . apixaban (ELIQUIS) 2.5 MG TABS tablet Take 1 tablet (2.5 mg total) by mouth 2 (two) times daily. 60 tablet 0  . aspirin EC 81 MG tablet Take 81 mg by mouth daily.    Marland Kitchen atorvastatin (LIPITOR) 80 MG tablet Take 80 mg by mouth daily.    Derrill Memo ON 10/21/2019] dexamethasone (DECADRON) 2 MG tablet Take 1 tablet (2 mg total) by mouth 2 (two) times daily. 14 tablet 0  . [START ON 10/28/2019] dexamethasone (DECADRON) 2 MG tablet Take 1 tablet (2 mg total) by mouth daily. 7 tablet 0  . [START ON 11/04/2019] dexamethasone (DECADRON) 2 MG tablet Take 1 tablet (2 mg total) by mouth every other day. 6 tablet 0  . dexamethasone (DECADRON) 4 MG tablet Take 1 tablet (4 mg total) by mouth 3 (three) times daily. 21 tablet 0  . [START ON 10/14/2019] dexamethasone (DECADRON) 4 MG tablet Take 1 tablet (4 mg total) by mouth 2 (two) times daily. 14 tablet 0  . feeding supplement, ENSURE ENLIVE, (ENSURE ENLIVE) LIQD Take 237 mLs by mouth 2 (two) times daily between meals. 237 mL 12  . glipiZIDE (GLUCOTROL) 5 MG tablet Take 5 mg by mouth daily before breakfast.    . [START ON 10/08/2019] hydrochlorothiazide 10 mg/mL SUSP Take 0.63 mLs (6.25 mg total) by mouth daily. 300 mL 0  . levETIRAcetam (KEPPRA) 1000 MG/100ML SOLN Inject 100 mLs (1,000 mg total) into the vein every 12 (twelve) hours. 6000 mL 0  . metFORMIN (GLUCOPHAGE) 1000 MG tablet Take 1 tablet (1,000 mg total) by mouth 2 (two) times daily with a meal. 30 tablet 0  . mirtazapine (REMERON SOL-TAB) 15 MG disintegrating tablet Take 1 tablet (15 mg total) by mouth at bedtime. 30 tablet 0  . [START ON 10/08/2019] nicotine (NICODERM CQ - DOSED IN MG/24  HOURS) 21 mg/24hr patch Place 1 patch (21 mg total) onto the skin daily. 28 patch 0  . Omega-3 Fatty Acids (FISH OIL) 1200 MG CAPS Take 1,200 mg by mouth daily.    Marland Kitchen oxyCODONE (OXY IR/ROXICODONE) 5 MG immediate release tablet Take 1 tablet (5 mg total) by mouth every 6 (six) hours as needed for moderate pain or severe pain. 30 tablet 0  . pantoprazole (PROTONIX) 40 MG tablet Take 1 tablet (40 mg total) by mouth daily. (Patient taking differently: Take 40 mg by mouth every evening. ) 30 tablet 11  . ramelteon (ROZEREM) 8 MG tablet Take 1 tablet (8 mg total) by mouth at bedtime. 30 tablet 0    Drug Regimen Review Drug regimen was reviewed and remains appropriate with no significant issues identified  Home: Home Living Family/patient expects to be discharged to:: Private residence Living Arrangements: Spouse/significant other Available Help at Discharge: Family, Available 24 hours/day Type of Home: House Home Access: Stairs to enter [employer] of Steps: 4 Entrance Stairs-Rails: Right Home Layout: One level Bathroom Shower/Tub: Tub/shower unit, Theatre stage manager  Toilet: Standard Home Equipment: Environmental consultant - 2 wheels  Lives With: Spouse   Functional History: Prior Function Level of Independence: Needs assistance Gait / Transfers Assistance Needed: pt indicates he was having some difficulty with walking due to Lt LE weakness.  He initially indicated he didn't have a RW, but later confirmed that he did have one  ADL's / Homemaking Assistance Needed: pt unable to provide clear info re: levl of assist with ADLs   Functional Status:  Mobility: Bed Mobility Overal bed mobility: Needs Assistance Bed Mobility: Supine to Sit Supine to sit: Mod assist General bed mobility comments: up in chair on arrival Transfers Overall transfer level: Needs assistance Equipment used: Rolling walker (2 wheeled) Transfers: Sit to/from Stand Sit to Stand: Min assist Stand pivot transfers: Min  assist General transfer comment: performed sit<>stand 12x. 4x normal, 4x from compliant surgace, & 4x with R LE placed at a disadvantage. Each time pt required min A to power up. Intermittant cues for technique. Ambulation/Gait Ambulation/Gait assistance: Min assist Gait Distance (Feet): 25 Feet Assistive device: Rolling walker (2 wheeled) Gait Pattern/deviations: Decreased stride length, Step-to pattern, Decreased step length - right, Decreased dorsiflexion - right, Shuffle, Trunk flexed General Gait Details: Assist required to place R UE on RW, but able to stay in place once placed. VC for RW proximity. Gait velocity: decreased Gait velocity interpretation: <1.31 ft/sec, indicative of household ambulator  ADL: ADL Overall ADL's : Needs assistance/impaired Eating/Feeding: NPO Grooming: Wash/dry hands, Wash/dry face, Oral care, Brushing hair, Set up, Sitting Upper Body Bathing: Moderate assistance, Sitting Lower Body Bathing: Moderate assistance, Sit to/from stand Upper Body Dressing : Maximal assistance, Sitting Lower Body Dressing: Maximal assistance, Sit to/from stand Toilet Transfer: Minimal assistance, +2 for safety/equipment, Stand-pivot, BSC, Requires wide/bariatric Toileting- Clothing Manipulation and Hygiene: Moderate assistance, Sit to/from stand Functional mobility during ADLs: Minimal assistance, +2 for safety/equipment, Cueing for safety, Rolling walker  Cognition: Cognition Overall Cognitive Status: Impaired/Different from baseline Arousal/Alertness: Awake/alert Orientation Level: Oriented X4 Attention: Sustained Sustained Attention: Appears intact Safety/Judgment: Impaired Cognition Arousal/Alertness: Awake/alert Behavior During Therapy: WFL for tasks assessed/performed Overall Cognitive Status: Impaired/Different from baseline Area of Impairment: Safety/judgement, Problem solving, Awareness, Following commands Current Attention Level: Sustained,  Selective Memory: Decreased short-term memory Following Commands: Follows one step commands consistently Safety/Judgement: Decreased awareness of deficits, Decreased awareness of safety Awareness: Intellectual Problem Solving: Slow processing, Difficulty sequencing, Requires verbal cues, Requires tactile cues General Comments: Pt seems to be improving with safety awareness. He requested use of RW prior to ambulation.  Cognition: Cognition Orientation Level: Oriented X4    Physical Exam: Blood pressure (!) 118/58, pulse 79, temperature 98 F (36.7 C), resp. rate 18, height 6' (1.829 m), weight 69.1 kg. General: Alert and oriented x 3, No apparent distress, chronically ill appearing.  HEENT: Head is normocephalic, atraumatic, PERRLA, EOMI, sclera anicteric, oral mucosa pink and moist, dentition intact, ext ear canals clear,  Neck: Supple without JVD or lymphadenopathy Heart: Reg rate and rhythm. +Murmur.  Chest: CTA bilaterally without wheezes, rales, or rhonchi; no distress Abdomen: Soft, non-tender, non-distended, bowel sounds positive. Extremities: No clubbing, cyanosis, or edema. Pulses are 2+ Skin: Clean and intact without signs of breakdown Neuro: Alert to name and location. Unable to say date and frustrated by the question.  Musculoskeletal: exam limited by patient's frustration in being transferred to rehab today (he prefers to be transferred tomorrow). Appears to be 4/5 throughout except for RLE which has minimal movement.  Psych: Depressed, cooperative, tangential, poor historian, word-finding  difficulties  Results for orders placed or performed during the hospital encounter of 10/07/19 (from the past 48 hour(s))  Glucose, capillary     Status: Abnormal   Collection Time: 10/07/19  5:25 PM  Result Value Ref Range   Glucose-Capillary 217 (H) 70 - 99 mg/dL   No results found.  Medical Problem List and Plan: 1.  Decreased functional mobility secondary to seizure/metastatic  adenocarcinoma of the lung with brain metastasis/left centrum semiovale CVA.  Decadron taper as directed  -patient may shower  -ELOS/Goals: 12-16 days, S with PT, OT, and SLP 2.  Antithrombotics: -DVT/anticoagulation: Eliquis 2.5mg  BID  -antiplatelet therapy: aspirin 81 mg daily 3. Pain Management: Tylenol as needed.   Ibuprofen 200mg  BID prn for headache. 4. Mood: Remeron 15 mg nightly, Rozerem 8 mg nightly  -antipsychotic agents: N/A 5. Neuropsych: This patient is capable of making decisions on his own behalf. 6. Skin/Wound Care: Routine skin checks 7. Fluids/Electrolytes/Nutrition: Routine in and outs with follow-up chemistries. Dysphagia 3 diet, thins. Ensure Enlive supplements.  8.  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  Status post stereotactic radiosurgery September 19, 2019 with baseline right side weakness.  Follow-up outpatient Dr. Earlie Server 9.  Seizure disorder.  Keppra 1000 mg every 12 hours. 10.  Hyperlipidemia.  Lipitor 11.  Diabetes mellitus.  SSI.  Hemoglobin A1c 7.3.  Monitor while Decadron is tapered. 12.  Tobacco abuse.  NicoDerm patch.  Provide counseling 13.  Diastolic congestive heart failure.  Monitor for any signs of fluid overload 14.  Hypertension.  Hydrochlorothiazide 6.25 mg daily. 15. Cough/chest congestion: prn Robitussin  CODE STATUS: Limited code Permitted: rapid response and ACLS No CPR, intubation, BiPAP, cardioversion  Dan A, PA-C  I have personally performed a face to face diagnostic evaluation, including, but not limited to relevant history and physical exam findings, of this patient and developed relevant assessment and plan.  Additionally, I have reviewed and concur with the physician assistant's documentation above.  The patient's status has not changed. The original post admission physician evaluation remains appropriate, and any changes from the pre-admission screening or documentation from the acute chart are noted above.     Izora Ribas, MD 10/07/2019

## 2019-10-08 ENCOUNTER — Inpatient Hospital Stay (HOSPITAL_COMMUNITY): Payer: Medicare Other | Admitting: Occupational Therapy

## 2019-10-08 ENCOUNTER — Inpatient Hospital Stay (HOSPITAL_COMMUNITY): Payer: Medicare Other | Admitting: Physical Therapy

## 2019-10-08 ENCOUNTER — Inpatient Hospital Stay (HOSPITAL_COMMUNITY): Payer: Medicare Other | Admitting: Speech Pathology

## 2019-10-08 DIAGNOSIS — I639 Cerebral infarction, unspecified: Secondary | ICD-10-CM

## 2019-10-08 DIAGNOSIS — G40909 Epilepsy, unspecified, not intractable, without status epilepticus: Secondary | ICD-10-CM

## 2019-10-08 DIAGNOSIS — E119 Type 2 diabetes mellitus without complications: Secondary | ICD-10-CM

## 2019-10-08 DIAGNOSIS — I5032 Chronic diastolic (congestive) heart failure: Secondary | ICD-10-CM

## 2019-10-08 LAB — COMPREHENSIVE METABOLIC PANEL
ALT: 28 U/L (ref 0–44)
AST: 11 U/L — ABNORMAL LOW (ref 15–41)
Albumin: 2.6 g/dL — ABNORMAL LOW (ref 3.5–5.0)
Alkaline Phosphatase: 63 U/L (ref 38–126)
Anion gap: 11 (ref 5–15)
BUN: 15 mg/dL (ref 8–23)
CO2: 29 mmol/L (ref 22–32)
Calcium: 9.2 mg/dL (ref 8.9–10.3)
Chloride: 97 mmol/L — ABNORMAL LOW (ref 98–111)
Creatinine, Ser: 0.85 mg/dL (ref 0.61–1.24)
GFR calc Af Amer: >60 mL/min (ref >60)
GFR calc non Af Amer: >60 mL/min (ref >60)
Glucose, Bld: 198 mg/dL — ABNORMAL HIGH (ref 70–99)
Potassium: 4.4 mmol/L (ref 3.5–5.1)
Sodium: 137 mmol/L (ref 135–145)
Total Bilirubin: 0.3 mg/dL (ref 0.3–1.2)
Total Protein: 6.2 g/dL — ABNORMAL LOW (ref 6.5–8.1)

## 2019-10-08 LAB — CBC WITH DIFFERENTIAL/PLATELET
Abs Immature Granulocytes: 0.18 10*3/uL — ABNORMAL HIGH (ref 0.00–0.07)
Basophils Absolute: 0 10*3/uL (ref 0.0–0.1)
Basophils Relative: 0 %
Eosinophils Absolute: 0.1 10*3/uL (ref 0.0–0.5)
Eosinophils Relative: 1 %
HCT: 39.4 % (ref 39.0–52.0)
Hemoglobin: 13.3 g/dL (ref 13.0–17.0)
Immature Granulocytes: 1 %
Lymphocytes Relative: 8 %
Lymphs Abs: 1.1 10*3/uL (ref 0.7–4.0)
MCH: 31 pg (ref 26.0–34.0)
MCHC: 33.8 g/dL (ref 30.0–36.0)
MCV: 91.8 fL (ref 80.0–100.0)
Monocytes Absolute: 1.1 10*3/uL — ABNORMAL HIGH (ref 0.1–1.0)
Monocytes Relative: 8 %
Neutro Abs: 11.6 10*3/uL — ABNORMAL HIGH (ref 1.7–7.7)
Neutrophils Relative %: 82 %
Platelets: 267 10*3/uL (ref 150–400)
RBC: 4.29 MIL/uL (ref 4.22–5.81)
RDW: 13.9 % (ref 11.5–15.5)
WBC: 14.1 10*3/uL — ABNORMAL HIGH (ref 4.0–10.5)
nRBC: 0 % (ref 0.0–0.2)

## 2019-10-08 LAB — GLUCOSE, CAPILLARY
Glucose-Capillary: 172 mg/dL — ABNORMAL HIGH (ref 70–99)
Glucose-Capillary: 231 mg/dL — ABNORMAL HIGH (ref 70–99)
Glucose-Capillary: 285 mg/dL — ABNORMAL HIGH (ref 70–99)
Glucose-Capillary: 267 mg/dL — ABNORMAL HIGH (ref 70–99)

## 2019-10-08 MED ORDER — QUETIAPINE 12.5 MG HALF TABLET
12.0000 mg | ORAL_TABLET | Freq: Every day | ORAL | Status: DC
  Administered 2019-10-08 – 2019-10-15 (×8): 12.5 mg via ORAL
  Filled 2019-10-08 (×8): qty 1

## 2019-10-08 NOTE — Patient Care Conference (Signed)
Inpatient RehabilitationTeam Conference and Plan of Care Update Date: 10/08/2019   Time: 10:35 AM    Patient Name: Geoffrey Benjamin      Medical Record Number: [id number]  Date of Birth: 1941/03/16 Sex: Male         Room/Bed: 4W06C/4W06C-01 Payor Info: Payor: MEDICARE / Plan: MEDICARE PART A AND B / Product Type: *No Product type* /    Admit Date/Time:  10/07/2019  2:28 PM  Primary Diagnosis:  Metastatic adenocarcinoma [facility])  Patient Active Problem List   Diagnosis Date Noted  . Aortic atherosclerosis ([place]) 10/07/2019  . Emphysema of lung ([place]) 10/07/2019  . Metastatic adenocarcinoma ([place]) 10/07/2019  . Focal and partial seizures ([place]) 10/01/2019  . Status epilepticus ([place]) 10/01/2019  . Adenocarcinoma of right lung, stage 4 ([place]) 09/05/2019  . Goals of care, counseling/discussion 09/05/2019  . Brain metastases ([place]) 09/02/2019  . Right sided weakness 09/01/2019  . Acute systolic congestive heart failure ([place]) 09/19/2018  . CAP (community acquired pneumonia) 09/13/2018  . Hyperlipidemia 09/13/2018  . Volume overload 09/13/2018  . Tobacco use 09/13/2018  . Aortic stenosis 09/13/2018  . Chronic pain 11/10/2015  . Hypertension 11/10/2015  . Tobacco dependence 11/10/2015  . GERD 06/04/2009  . PERSONAL HX COLONIC POLYPS 06/04/2009  . LOSS OF WEIGHT 09/29/2008  . RLQ PAIN 09/29/2008  . Type 2 diabetes mellitus ([place]) 09/22/2008    Expected Discharge Date: Expected Discharge Date: (Estimated LOS 2-2 1/2 weeks)  Team Members Present: Physician leading conference: Dr. Alger Simons Social Worker Present: Lennart Pall, LCSW Nurse Present: Judee Clara, LPN Case Manager: Karene Fry, RN PT Present: Lavone Nian, PT OT Present: Cherylynn Ridges, OT SLP Present: Weston Anna, SLP PPS Coordinator present : Ileana Ladd, Burna Mortimer, SLP     Current Status/Progress Goal Weekly Team Focus  Bowel/Bladder   LBM 1/4 Incontient of bowel and bladder.  Timed toiletiing while awake.   Assess bowel and bladder needs qshift and PRN   Swallow/Nutrition/ Hydration   Dys. 2 textures with thin liquids, intermittent supervision  Mod I  tolerance of current diet   ADL's   Min/Mod A BADL tasks  Supervision  R NMR, R inattention, functional transferes, stand balance/endurance, self-care retraining,   Mobility   min assist gait with RW, CGA transfers, supervision sit>supine, R inattention  supervision overall with LRAD  R NMR & attention, balance, endurance, gait, transfers, stair negotiation, pt education, d/c planning   Communication   Supervision  Mod I  speech intelligibility strategies   Safety/Cognition/ Behavioral Observations  Mod A  Min A  problem solving, recall, awareness, attention   Pain   No c/o pain  remain pain free  assess pain qshift and   Skin   Skin tear to right elbow foan dressing in place  promote wound healing. no new skin impairments  assess skin qshift and PRN    Rehab Goals Patient on target to meet rehab goals: Yes *See Care Plan and progress notes for long and short-term goals.     Barriers to Discharge  Current Status/Progress Possible Resolutions Date Resolved   Nursing                  PT                    OT                  SLP  SW                Discharge Planning/Teaching Needs:  New admit - per [facility], plan for pt to d/c home with wife who can provide only supervision.  Teaching needs TBD   Team Discussion: 79 yo lung CA with brain mets, [place] infarct, palliative/oncology following, confusion last night.  RN confused last night, inc B/B.  OT pending.  PT pending.  SLP oral residue with solids, downgraded to Dys2, decreased memory, wife doing for him at home.  Neuropsych to see for coping.   Revisions to Treatment Plan: N/A     Medical Summary Current Status: adenoca mets to brain, left CS stroke, hs confusion. HS confusion Weekly Focus/Goal: mood, cognition, dysphagia, sleep/wake and sundowning.  Barriers to  Discharge: Behavior;Medical stability       Continued Need for Acute Rehabilitation Level of Care: The patient requires daily medical management by a physician with specialized training in physical medicine and rehabilitation for the following reasons: Direction of a multidisciplinary physical rehabilitation program to maximize functional independence : Yes Medical management of patient stability for increased activity during participation in an intensive rehabilitation regime.: Yes Analysis of laboratory values and/or radiology reports with any subsequent need for medication adjustment and/or medical intervention. : Yes   I attest that I was present, lead the team conference, and concur with the assessment and plan of the team.   [name] 10/08/2019, 9:24 PM   Team conference was held via web/ teleconference due to [place] - 19

## 2019-10-08 NOTE — Plan of Care (Signed)
Problem: RH Balance Goal: LTG: Patient will maintain dynamic sitting balance (OT) Description: LTG:  Patient will maintain dynamic sitting balance with assistance during activities of daily living (OT) Flowsheets (Taken 10/08/2019 1339) LTG: Pt will maintain dynamic sitting balance during ADLs with: Supervision/Verbal cueing Goal: LTG Patient will maintain dynamic standing with ADLs (OT) Description: LTG:  Patient will maintain dynamic standing balance with assist during activities of daily living (OT)  Flowsheets (Taken 10/08/2019 1339) LTG: Pt will maintain dynamic standing balance during ADLs with: Supervision/Verbal cueing   Problem: Sit to Stand Goal: LTG:  Patient will perform sit to stand in prep for activites of daily living with assistance level (OT) Description: LTG:  Patient will perform sit to stand in prep for activites of daily living with assistance level (OT) Flowsheets (Taken 10/08/2019 1339) LTG: PT will perform sit to stand in prep for activites of daily living with assistance level: Supervision/Verbal cueing   Problem: RH Eating Goal: LTG Patient will perform eating w/assist, cues/equip (OT) Description: LTG: Patient will perform eating with assist, with/without cues using equipment (OT) Flowsheets (Taken 10/08/2019 1339) LTG: Pt will perform eating with assistance level of: Independent with assistive device    Problem: RH Grooming Goal: LTG Patient will perform grooming w/assist,cues/equip (OT) Description: LTG: Patient will perform grooming with assist, with/without cues using equipment (OT) Flowsheets (Taken 10/08/2019 1339) LTG: Pt will perform grooming with assistance level of: Supervision/Verbal cueing   Problem: RH Bathing Goal: LTG Patient will bathe all body parts with assist levels (OT) Description: LTG: Patient will bathe all body parts with assist levels (OT) Flowsheets (Taken 10/08/2019 1339) LTG: Pt will perform bathing with assistance level/cueing:  Supervision/Verbal cueing   Problem: RH Dressing Goal: LTG Patient will perform upper body dressing (OT) Description: LTG Patient will perform upper body dressing with assist, with/without cues (OT). Flowsheets (Taken 10/08/2019 1339) LTG: Pt will perform upper body dressing with assistance level of: Supervision/Verbal cueing Goal: LTG Patient will perform lower body dressing w/assist (OT) Description: LTG: Patient will perform lower body dressing with assist, with/without cues in positioning using equipment (OT) Flowsheets (Taken 10/08/2019 1339) LTG: Pt will perform lower body dressing with assistance level of: Supervision/Verbal cueing   Problem: RH Toileting Goal: LTG Patient will perform toileting task (3/3 steps) with assistance level (OT) Description: LTG: Patient will perform toileting task (3/3 steps) with assistance level (OT)  Flowsheets (Taken 10/08/2019 1339) LTG: Pt will perform toileting task (3/3 steps) with assistance level: Supervision/Verbal cueing   Problem: RH Functional Use of Upper Extremity Goal: LTG Patient will use RT/LT upper extremity as a (OT) Description: LTG: Patient will use right/left upper extremity as a stabilizer/gross assist/diminished/nondominant/dominant level with assist, with/without cues during functional activity (OT) Flowsheets (Taken 10/08/2019 1339) LTG: Use of upper extremity in functional activities: RUE as diminished level   Problem: RH Toilet Transfers Goal: LTG Patient will perform toilet transfers w/assist (OT) Description: LTG: Patient will perform toilet transfers with assist, with/without cues using equipment (OT) Flowsheets (Taken 10/08/2019 1339) LTG: Pt will perform toilet transfers with assistance level of: Supervision/Verbal cueing   Problem: RH Tub/Shower Transfers Goal: LTG Patient will perform tub/shower transfers w/assist (OT) Description: LTG: Patient will perform tub/shower transfers with assist, with/without cues using equipment  (OT) Flowsheets (Taken 10/08/2019 1339) LTG: Pt will perform tub/shower stall transfers with assistance level of: Supervision/Verbal cueing   Problem: RH Awareness Goal: LTG: Patient will demonstrate awareness during functional activites type of (OT) Description: LTG: Patient will demonstrate awareness during functional activites  type of (OT) Flowsheets (Taken 10/08/2019 1339) LTG: Patient will demonstrate awareness during functional activites type of (OT): Supervision

## 2019-10-08 NOTE — Progress Notes (Signed)
[facility] PHYSICAL MEDICINE & REHABILITATION PROGRESS NOTE   Subjective/Complaints: Up in chair working on swallow with SLP. Had a difficult night. Was confused and remembers being confused. Couldn't seem to break free of it at the time. Feels better this morning  ROS: Patient denies fever, rash, sore throat, blurred vision, nausea, vomiting, diarrhea, cough, shortness of breath or chest pain, joint or back pain, headache     Objective:   No results found. Recent Labs    10/08/19 0629  WBC 14.1*  HGB 13.3  HCT 39.4  PLT 267   Recent Labs    10/08/19 0629  NA 137  K 4.4  CL 97*  CO2 29  GLUCOSE 198*  BUN 15  CREATININE 0.85  CALCIUM 9.2    Intake/Output Summary (Last 24 hours) at 10/08/2019 1057 Last data filed at 10/08/2019 0901 Gross per 24 hour  Intake 360 ml  Output 1450 ml  Net -1090 ml     Physical Exam: Vital Signs Blood pressure (!) 118/58, pulse 79, temperature 98 F (36.7 C), resp. rate 18, height 6' (1.829 m), weight 69.1 kg. Constitutional: No distress . Vital signs reviewed. Frail appearing HEENT: EOMI, oral membranes moist Neck: supple Cardiovascular: RRR with murmur. No JVD    Respiratory: CTA Bilaterally without wheezes or rales. Normal effort    GI: BS +, non-tender, non-distended  Extremities: No clubbing, cyanosis, or edema. Pulses are 2+ Skin: Clean and intact without signs of breakdown Neuro: Alert to name and location. Follows simple commands. Described last night's experience to me and SLP. Musculoskeletal:   Appears to be 4/5 UE, BLE grossly 3+ to 4/5 prox to distal--inconsistent effort. ?mild apraxia vs processing delays Psych: smiling, generally up beat and pleasant    Assessment/Plan: 1. 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  Physiatrist is providing close team  supervision and 24 hour management of active medical problems listed below.  Physiatrist and rehab team continue to assess barriers to discharge/monitor patient progress toward functional and medical goals  Care Tool:  Bathing              Bathing assist       Upper Body Dressing/Undressing Upper body dressing        Upper body assist      Lower Body Dressing/Undressing Lower body dressing            Lower body assist       Toileting Toileting    Toileting assist Assist for toileting: Moderate Assistance - Patient 50 - 74%     Transfers Chair/bed transfer  Transfers assist           Locomotion Ambulation   Ambulation assist              Walk 10 feet activity   Assist           Walk 50 feet activity   Assist           Walk 150 feet activity   Assist           Walk 10 feet on uneven surface  activity   Assist           Wheelchair     Assist               Wheelchair 50 feet with 2 turns activity    Assist  Wheelchair 150 feet activity     Assist          Blood pressure (!) 118/58, pulse 79, temperature 98 F (36.7 C), resp. rate 18, height 6' (1.829 m), weight 69.1 kg.  Medical Problem List and Plan: 1.  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  Decadron taper as directed             -patient may shower             -ELOS/Goals: 12-16 days, S with PT, OT, and SLP 2.  Antithrombotics: -DVT/anticoagulation: Eliquis 2.5mg  BID             -antiplatelet therapy: aspirin 81 mg daily 3. Pain Management: Tylenol as needed.              Ibuprofen 200mg  BID prn for headache. 4. Mood/HS confusion: likely multifactorial given cva/mets/steroids/keppra/mood  -continue Remeron 15 mg nightly   -dc rozerem  -trial of seroquel 12.5mg  tonight at 9pm given delusional behaviors  -keep sleep  chart             -antipsychotic agents: N/A  -neuropsych assessment would be helpful 5. Neuropsych: This patient is capable of making decisions on his own behalf. 6. Skin/Wound Care: Routine skin checks 7. Fluids/Electrolytes/Nutrition: Routine in and outs with follow-up chemistries. Dysphagia 3 diet, thins. Ensure Enlive supplements.  -I personally reviewed the patient's labs today.    8.  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  Status post stereotactic radiosurgery September 19, 2019 with baseline right side weakness.  Follow-up outpatient Dr. Earlie Server 9.  Seizure disorder.  Keppra 1000 mg every 12 hours. 10.  Hyperlipidemia.  Lipitor 11.  Diabetes mellitus.    Hemoglobin A1c 7.3.   -cover with SSI as decadron tapered 12.  Tobacco abuse.  NicoDerm patch.  Provide counseling 13.  Diastolic congestive heart failure.  Monitor for any signs of fluid overload  -need daily weights Filed Weights   10/07/19 1436  Weight: 69.1 kg    14.  Hypertension.  Hydrochlorothiazide 6.25 mg daily. 15. Cough/chest congestion: prn Robitussin  CODE STATUS: Limited code Permitted: rapid response and ACLS No CPR, intubation, BiPAP, cardioversion    LOS: 1 days A FACE TO FACE EVALUATION WAS PERFORMED  Meredith Staggers 10/08/2019, 10:57 AM

## 2019-10-08 NOTE — Evaluation (Addendum)
Physical Therapy Assessment and Plan  Patient Details  Name: Geoffrey Benjamin MRN: [id number] Date of Birth: 08-26-1941  PT Diagnosis: Abnormal posture, Abnormality of gait, Cognitive deficits, Coordination disorder, Difficulty walking, Hemiplegia non-dominant, Impaired cognition and Muscle weakness Rehab Potential: Good ELOS: 7-9 days   Today's Date: 10/08/2019 PT Individual Time: 1305-1400 and 1502-1530 PT Individual Time Calculation (min): 55 min and 28 min    Problem List:  Patient Active Problem List   Diagnosis Date Noted  . Aortic atherosclerosis ([place]) 10/07/2019  . Emphysema of lung ([place]) 10/07/2019  . Metastatic adenocarcinoma ([place]) 10/07/2019  . Focal and partial seizures ([place]) 10/01/2019  . Status epilepticus ([place]) 10/01/2019  . Adenocarcinoma of right lung, stage 4 ([place]) 09/05/2019  . Goals of care, counseling/discussion 09/05/2019  . Brain metastases ([place]) 09/02/2019  . Right sided weakness 09/01/2019  . Acute systolic congestive heart failure ([place]) 09/19/2018  . CAP (community acquired pneumonia) 09/13/2018  . Hyperlipidemia 09/13/2018  . Volume overload 09/13/2018  . Tobacco use 09/13/2018  . Aortic stenosis 09/13/2018  . Chronic pain 11/10/2015  . Hypertension 11/10/2015  . Tobacco dependence 11/10/2015  . GERD 06/04/2009  . PERSONAL HX COLONIC POLYPS 06/04/2009  . LOSS OF WEIGHT 09/29/2008  . RLQ PAIN 09/29/2008  . Type 2 diabetes mellitus ([place]) 09/22/2008    Past Medical History:  Past Medical History:  Diagnosis Date  . Aortic stenosis 09/13/2018  . CAP (community acquired pneumonia) 09/13/2018  . CHF (congestive heart failure) ([place])   . Difficulty walking   . DM (diabetes mellitus) ([place])   . GERD 06/04/2009   Qualifier: Diagnosis of  By: Henrene Pastor MD, Docia Chuck   . Hyperlipidemia 09/13/2018  . Hypertension   . Loss of weight 09/29/2008   Qualifier: Diagnosis of  By: Bobby Rumpf CMA (AAMA), [place]    . Non-small cell carcinoma of right lung, stage 4 ([place])   .  PERSONAL HX COLONIC POLYPS 06/04/2009   Qualifier: Diagnosis of  By: Henrene Pastor MD, [place] PAIN 09/29/2008   Qualifier: Diagnosis of  By: Bobby Rumpf CMA (AAMA), [place]    . Tachycardia   . Tobacco use 09/13/2018  . Volume overload 09/13/2018   Past Surgical History:  Past Surgical History:  Procedure Laterality Date  . CATARACT EXTRACTION, BILATERAL  08/2016  . DOPPLER ECHOCARDIOGRAPHY     Mitral inflow and tissue doppler consistent with impaired LV relaxation; Trileaflet aortic vlave with moderate aortic valve stenosis, Trivial mitral and tricuspid valve regurgitation.    Assessment & Plan Clinical Impression: Patient is a 79 y.o. 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 Earlie Benjamin and Dr. Lisbeth Benjamin at the [facility] center.  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  Per chart review patient lives with spouse.  1 level home 4 steps to entry.  He did use a rolling walker at times.  Presented 10/01/2019 with repetitive twitching and jerking-like movements to the right upper extremity and right facial twitching.  Patient did receive 7.5 mg of Versed on route to the ED with noticeable improvement.  Cranial CT scan showed extensive vasogenic edema within the left cerebral hemisphere similar to prior MRI on 07/03/2019.  Multiple intracranial metastasis  including a dominant 2.2 cm left parietal lobe metastasis.  Redemonstrated mass-effect with partial effacement of the left lateral ventricle.  4 mm rightward midline shift.  No acute intracranial hemorrhage.  EEG showed evidence of focal convulsive status epilepticus arising from left hemisphere at the  beginning of the study as well as cortical dysfunction likely secondary to underlying metastatic disease.  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  No new mets identified within limitations of motion artifact.  Currently maintained on Eliquis for CVA prophylaxis.  Echocardiogram with ejection fraction of 65%.  Patient was loaded with Keppra as well as Decadron taper as indicated.  Palliative care consulted to establish goals of care.  Oncology service Dr. Julien Benjamin recommends follow-up outpatient.  Tolerating mechanical soft diet.  Therapy evaluations completed and patient was admitted for a comprehensive rehab program.  Patient transferred to CIR on 10/07/2019. Patient currently requires min with mobility secondary to muscle weakness, decreased cardiorespiratoy endurance, decreased coordination, decreased visual perceptual skills, decreased attention to right, decreased attention, decreased awareness, decreased problem solving, decreased safety awareness, decreased memory and delayed processing, and decreased standing balance, decreased postural control, decreased balance strategies and R hemiparesis.  Prior to hospitalization, patient was independent without AD prior to December 2020 but then began using RW with mobility and lived with Spouse in a House home.  Home access is 4Stairs to enter.  Patient will benefit from skilled PT intervention to maximize safe functional mobility, minimize fall risk and decrease caregiver burden for planned discharge home with 24 hour supervision.  Anticipate patient will benefit from follow up [place] at discharge.  PT - End of Session Activity Tolerance: Tolerates 30+ min activity with multiple rests Endurance Deficit: Yes Endurance Deficit Description: generalized deconditioning & poor endurance PT Assessment Rehab Potential (ACUTE/IP ONLY): Good PT Patient  demonstrates impairments in the following area(s): Balance;Behavior;Safety;Perception;Endurance;Motor PT Transfers Functional Problem(s): Bed Mobility;Bed to Chair;Car;Furniture PT Locomotion Functional Problem(s): Stairs;Ambulation;Wheelchair Mobility PT Plan PT Intensity: Minimum of 1-2 x/day ,45 to 90 minutes PT Frequency: 5 out of 7 days PT Duration Estimated Length of Stay: 7-9 days PT Treatment/Interventions: Ambulation/gait training;Community reintegration;DME/adaptive equipment instruction;Neuromuscular re-education;Psychosocial support;Stair training;UE/LE Strength taining/ROM;Wheelchair propulsion/positioning;Balance/vestibular training;Discharge planning;Functional electrical stimulation;Pain management;Skin care/wound management;Therapeutic Activities;UE/LE Coordination activities;Visual/perceptual remediation/compensation;Therapeutic Exercise;Splinting/orthotics;Patient/family education;Functional mobility training;Cognitive remediation/compensation;Disease management/prevention PT Transfers Anticipated Outcome(s): supervision with LRAD PT Locomotion Anticipated Outcome(s): supervision with LRAD PT Recommendation Follow Up Recommendations: Home health PT;24 hour supervision/assistance Patient destination: Home Equipment Recommended: To be determined  Skilled Therapeutic Intervention Treatment 1: Patient received in recliner & agreeable to tx but reporting fatigue. Therapist educated pt on ELOS, daily therapy schedule, weekly team meetings & other CIR information. Manual testing completed, please see below. Pt reports SOB & SpO2 = 96% on room air, therapist educated pt on pursed lip breathing to help alleviate feeling SOB. Pt reports need to use restroom & completes sit<>stand with CGA and ambulates in room/bathroom with RW & min assist. Pt manages clothing on L side with therapist attending to clothing on R side with pt performing toilet transfer with CGA & having continent void on  toilet. Pt stands without alerting therapist he's finished. Pt stands at sink and performs hand hygiene with cuing to locate soap and attend to RUE during task. Pt transitions to w/c and therapist transported him to ortho gym via w/c dependent assist for time management. Pt ambulates w/c<>car with RW & min assist with gait  pattern as noted below. Pt completes car transfer at sedan simulated height with supervision & instructional cuing to sit vs stepping in/out. Therapist provided pt with 18x18 w/c with cushion to prevent skin breakdown & promote OOB tolerance. Pt returned to room & ambulates to bed, transferring sit>supine with supervision and min cuing to completely place RLE in be. Pt left in bed with all needs in reach, therapist reviewed use of call bell with pt.  Treatment 2: Pt received in bed & agreeable to tx. No c/o pain reported. Pt rolls R with supervision & transfers supine>sitting EOB with min assist to upright trunk with bed flat, no rails. Pt ambulates to w/c with RW & CGA with cuing throughout session for proper hand placement during transfers. Transported pt to gym via w/c dependent assist and there pt ambulates 100 ft with RW & CGA. Pt negotiates 4 steps (6") with B rails and min assist with cuing to attend to RUE on rail when descending stairs and cuing to ensure RLE is placed entirely on step. Pt unable to propel w/c with BUE 2/2 impaired use of RUE. Pt returned to room and reports need to use restroom. Pt ambulates into bathroom with min assist with max cuing to attend to RW on R side as pt bumping AD on door and appears unaware. Pt performs toilet transfer with CGA and has continent void on toilet. Pt stands at sink & requires cuing to attend to RUE while performing hand hygiene. At end of session pt left in recliner with chair alarm donned, BLE elevated, & all needs in reach.   Addendum: Pt's chart states he's R hand dominant but multiple times pt informs this therapist that he's L hand  dominant.  PT Evaluation Precautions/Restrictions Precautions Precautions: Fall Precaution Comments: R inattention Restrictions Weight Bearing Restrictions: No   General Chart Reviewed: Yes Additional Pertinent History: DM2, HTN, CHF with EF of 35%, HLD Response to Previous Treatment: Patient reporting fatigue but able to participate. Family/Caregiver Present: No   Home Living/Prior Functioning Home Living Available Help at Discharge: Family;Available 24 hours/day Type of Home: House Home Access: Stairs to enter [employer] of Steps: 4 Entrance Stairs-Rails: Left;Right(wideset) Home Layout: One level Additional Comments: Has a RW only   Lives With: Spouse Prior Function Level of Independence: Independent with basic ADLs;Independent with gait;Independent with transfers(pt reports he was independent without AD prior to December 2020) Driving: No Comments: Retired Product manager. Likes to go to [employer] and go fishing   Vision/Perception  Pt reports hx of cataract removal & notes he wears glasses for reading at baseline. Pt reports his vision is "not good" and is blurry and only slightly improved with use of glasses. Perception Perception: Impaired Inattention/Neglect: Does not attend to right side of body;Does not attend to right visual field Praxis Praxis: Impaired   Cognition Overall Cognitive Status: Impaired/Different from baseline Arousal/Alertness: Awake/alert Orientation Level: Oriented to person;Oriented to place;Oriented to situation(unable to recall current month or year) Memory: Impaired Memory Impairment: Decreased recall of new information Awareness: Impaired Awareness Impairment: Intellectual impairment Problem Solving: Impaired Problem Solving Impairment: Functional basic Safety/Judgment: Impaired   Sensation Sensation Light Touch: Appears Intact(BLE tested & intact) Proprioception: Appears Intact(tested & intact  BLE) Coordination Gross Motor Movements are Fluid and Coordinated: No Fine Motor Movements are Fluid and Coordinated: No Heel Shin Test: attempts on RLE but unable to touch heel to shin of opposite LE   Motor  Motor Motor: Abnormal postural alignment and control Motor -  Skilled Clinical Observations: 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), generalized deconditioning   Mobility Bed Mobility Bed Mobility: Rolling Right;Supine to Sit;Sit to Supine(bed flat, no rails) Rolling Right: Supervision/verbal cueing Supine to Sit: Minimal Assistance - Patient > 75%(to upright trunk) Sit to Supine: Supervision/Verbal cueing Transfers Transfers: Sit to Stand;Stand to Sit Sit to Stand: Minimal Assistance - Patient > 75% Stand to Sit: Minimal Assistance - Patient > 75% Stand Pivot Transfers: Moderate Assistance - Patient 50 - 74% Transfer (Assistive device): (armrests, RW)   Locomotion  Gait Ambulation: Yes Gait Assistance: Minimal Assistance - Patient > 75% Gait Distance (Feet): 15 Feet Assistive device: Rolling walker Gait Gait: Yes Gait Pattern: (decreased foot clearance RLE (audible sliding on floor), decreased hip & knee flexion BLE, decreased step length BLE, decreased stride length) Gait velocity: decreased Stairs / Additional Locomotion Stairs: Yes Stairs Assistance: Minimal Assistance - Patient > 75% Stair Management Technique: Two rails Number of Stairs: 4 Height of Stairs: 6(inches) Wheelchair Mobility Wheelchair Mobility: No(unable to grasp wheel with RUE)   Trunk/Postural Assessment  Thoracic Assessment Thoracic Assessment: Exceptions to WFL(rounded shoulders) Lumbar Assessment Lumbar Assessment: Exceptions to WFL(posterior pelvic tilt) Postural Control Postural Control: Deficits on evaluation Righting Reactions: delayed Protective Responses: delayed   Balance Balance Balance Assessed: Yes Static Sitting Balance Static Sitting - Balance Support: Feet supported Static Sitting -  Level of Assistance: 5: Stand by assistance Static Standing Balance Static Standing - Balance Support: During functional activity;No upper extremity supported(hand hygiene standing at sink) Static Standing - Level of Assistance: 5: Stand by assistance;4: Min assist  Extremity Assessment  Per OT Assessment: RUE Assessment RUE Assessment: Exceptions to [facility] RUE Body System: Ortho;Neuro Brunstrum levels for arm and hand: Hand;Arm Brunstrum level for arm: Stage III Synergy is performed voluntarily Brunstrum level for hand: Stage III Synergies performed voluntarily RUE Strength Right Shoulder Flexion: 2-/5 LUE Assessment LUE Assessment: Within Functional Limits  RLE Assessment RLE Assessment: Exceptions to [facility] General Strength Comments: tested in sitting, hip flexion = 2/5, knee flexion & extension = 3+/5, dorsiflexion = 3+/5 LLE Assessment General Strength Comments: tested in sitting, hip flexion = 3+/5, knee flexion & extension = 4/5, dorsiflexion = 4/5    Refer to Care Plan for Long Term Goals  Recommendations for other services: None   Discharge Criteria: Patient will be discharged from PT if patient refuses treatment 3 consecutive times without medical reason, if treatment goals not met, if there is a change in medical status, if patient makes no progress towards goals or if patient is discharged from hospital.  The above assessment, treatment plan, treatment alternatives and goals were discussed and mutually agreed upon: by patient  [name] 10/08/2019, 3:56 PM

## 2019-10-08 NOTE — Progress Notes (Signed)
Inpatient Rehabilitation  Patient information reviewed and entered into eRehab system by Nature Kueker M. Cobi Delph, M.A., CCC/SLP, PPS Coordinator.  Information including medical coding, functional ability and quality indicators will be reviewed and updated through discharge.    

## 2019-10-08 NOTE — Evaluation (Signed)
Speech Language Pathology Assessment and Plan  Patient Details  Name: Geoffrey Benjamin MRN: [id number] Date of Birth: [birth year]  SLP Diagnosis: Dysarthria;Cognitive Impairments;Dysphagia  Rehab Potential: Good ELOS: 12-14 days    Today's Date: 10/08/2019 SLP Individual Time: 4098-1191 SLP Individual Time Calculation (min): 60 min   Problem List:  Patient Active Problem List   Diagnosis Date Noted  . Aortic atherosclerosis ([place]) 10/07/2019  . Emphysema of lung ([place]) 10/07/2019  . Metastatic adenocarcinoma ([place]) 10/07/2019  . Focal and partial seizures ([place]) 10/01/2019  . Status epilepticus ([place]) 10/01/2019  . Adenocarcinoma of right lung, stage 4 ([place]) 09/05/2019  . Goals of care, counseling/discussion 09/05/2019  . Brain metastases ([place]) 09/02/2019  . Right sided weakness 09/01/2019  . Acute systolic congestive heart failure ([place]) 09/19/2018  . CAP (community acquired pneumonia) 09/13/2018  . Hyperlipidemia 09/13/2018  . Volume overload 09/13/2018  . Tobacco use 09/13/2018  . Aortic stenosis 09/13/2018  . Chronic pain 11/10/2015  . Hypertension 11/10/2015  . Tobacco dependence 11/10/2015  . GERD 06/04/2009  . PERSONAL HX COLONIC POLYPS 06/04/2009  . LOSS OF WEIGHT 09/29/2008  . RLQ PAIN 09/29/2008  . Type 2 diabetes mellitus ([place]) 09/22/2008   Past Medical History:  Past Medical History:  Diagnosis Date  . Aortic stenosis 09/13/2018  . CAP (community acquired pneumonia) 09/13/2018  . CHF (congestive heart failure) ([place])   . Difficulty walking   . DM (diabetes mellitus) ([place])   . GERD 06/04/2009   Qualifier: Diagnosis of  By: Henrene Pastor MD, Docia Chuck   . Hyperlipidemia 09/13/2018  . Hypertension   . Loss of weight 09/29/2008   Qualifier: Diagnosis of  By: Bobby Rumpf CMA (AAMA), Pat[place]    . Non-small cell carcinoma of right lung, stage 4 ([place])   . PERSONAL HX COLONIC POLYPS 06/04/2009   Qualifier: Diagnosis of  By: Henrene Pastor MD, [place] PAIN 09/29/2008   Qualifier: Diagnosis of   By: Bobby Rumpf CMA (AAMA), Patty    . Tachycardia   . Tobacco use 09/13/2018  . Volume overload 09/13/2018   Past Surgical History:  Past Surgical History:  Procedure Laterality Date  . CATARACT EXTRACTION, BILATERAL  08/2016  . DOPPLER ECHOCARDIOGRAPHY     Mitral inflow and tissue doppler consistent with impaired LV relaxation; Trileaflet aortic vlave with moderate aortic valve stenosis, Trivial mitral and tricuspid valve regurgitation.    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 Earlie Server and Dr. Lisbeth Renshaw at the [facility] health cancer center. 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 Presented 10/01/2019 with repetitive twitching and jerking-like movements to the right upper extremity and right facial twitching. Patient did receive 7.5 mg of Versed on route to the ED with noticeable improvement. Cranial CT scan showed extensive vasogenic edema within the left cerebral hemisphere similar to prior MRI on 07/03/2019. Multiple intracranial metastasis including a dominant 2.2 cm left parietal lobe metastasis. Redemonstrated mass-effect with partial effacement of the left lateral ventricle. 4 mm rightward midline shift. No acute intracranial hemorrhage. 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 Follow-up MRI shows 4 mm acute infarct  in the left centrum semiovale slightly increased size a left parietal metastasis with unchanged extensive vasogenic edema. No new mets identified within limitations of motion  artifact. Currently maintained on Eliquis for CVA prophylaxis. Echocardiogram with ejection fraction of 65%. Patient was loaded with Keppra as well as Decadron taper as indicated. Palliative care consulted to establish goals of care. Oncology service Dr. Julien Nordmann recommends follow-up outpatient. Tolerating mechanical soft diet. Therapy evaluations completed and patient was recommended for a comprehensive rehab program. Patient admitted 10/07/19.  Patient demonstrates moderate-severe cognitive impairments impacting functional problem solving, recall of functional information, intellectual awareness of deficits and sustained attention. Patient also demonstrates mild dysarthria characterized by right oral weakness with decreased sensation. Patient's oral deficits result in prolonged and decreased mastication of solid textures with mild oral residue and anterior spillage. Therefore, recommend patient downgrade to Dys. 2 textures, patient in agreement.  Patient also consumed trials of thin liquids via cup without overt s/s of aspiration. Recommend patient continue thin liquids with intermittent supervision.  Patient would benefit from skilled SLP intervention to maximize his cognitive, speech and swallowing function prior to discharge.    Skilled Therapeutic Interventions          Administered a cognitive-linguistic evaluation and BSE, please see above for details.   SLP Assessment  Patient will need skilled Speech Lanaguage Pathology Services during CIR admission    Recommendations  SLP Diet Recommendations: Dysphagia 2 (Fine chop);Thin Medication Administration: Whole meds with puree Supervision: Intermittent supervision to cue for compensatory strategies;Patient able to self feed Postural Changes and/or Swallow Maneuvers: Seated upright 90 degrees Oral Care Recommendations: Oral care BID Recommendations for Other Services: Neuropsych consult Patient destination: Home Follow up Recommendations: Home  Health SLP;24 hour supervision/assistance Equipment Recommended: To be determined    SLP Frequency 3 to 5 out of 7 days   SLP Duration  SLP Intensity  SLP Treatment/Interventions 12-14 days  Minumum of 1-2 x/day, 30 to 90 minutes  Cognitive remediation/compensation;Speech/Language facilitation;Dysphagia/aspiration precaution training;Internal/external aids;Cueing hierarchy;Environmental controls;Therapeutic Activities;Functional tasks;Patient/family education    Pain No/Denies Pain   Prior Functioning Type of Home: House  Lives With: Spouse Available Help at Discharge: Family;Available 24 hours/day  SLP Evaluation Cognition Overall Cognitive Status: Impaired/Different from baseline Arousal/Alertness: Awake/alert Orientation Level: Oriented X4 Sustained Attention: Impaired Sustained Attention Impairment: Functional basic Memory: Impaired Memory Impairment: Decreased recall of new information Immediate Memory Recall: Sock;Blue;Bed Memory Recall Sock: With Cue Memory Recall Blue: Without Cue Memory Recall Bed: Not able to recall Awareness: Impaired Awareness Impairment: Intellectual impairment Problem Solving: Impaired Problem Solving Impairment: Functional basic Safety/Judgment: Impaired  Comprehension Auditory Comprehension Overall Auditory Comprehension: Appears within functional limits for tasks assessed Visual Recognition/Discrimination Discrimination: Not tested Reading Comprehension Reading Status: Not tested Expression Expression Primary Mode of Expression: Verbal Verbal Expression Overall Verbal Expression: Appears within functional limits for tasks assessed Initiation: No impairment Written Expression Dominant Hand: Left Written Expression: Not tested Oral Motor Oral Motor/Sensory Function Overall Oral Motor/Sensory Function: Mild impairment Facial ROM: Reduced right Facial Symmetry: Abnormal symmetry right;Suspected CN VII (facial)  dysfunction Facial Sensation: Reduced right;Suspected CN V (Trigeminal) dysfunction Lingual ROM: Reduced right;Reduced left Lingual Strength: Reduced Velum: Suspected CN X (Vagus) dysfunction Mandible: Within Functional Limits Motor Speech Overall Motor Speech: Impaired Respiration: Impaired Level of Impairment: Conversation Phonation: Normal Resonance: Hypernasality Articulation: Impaired Intelligibility: Intelligible Phrase: 75-100% accurate Motor Planning: Witnin functional limits   Bedside Swallowing Assessment General Previous Swallow Assessment: BSE 12/31: Recommended Dys. 3 textures with thin liquids Diet Prior to this Study:  Dysphagia 3 (soft);Thin liquids Temperature Spikes Noted: No Respiratory Status: Room air History of Recent Intubation: No Behavior/Cognition: Alert;Cooperative;Distractible;Requires cueing Oral Cavity - Dentition: Missing dentition Self-Feeding Abilities: Able to feed self Vision: Functional for self-feeding Patient Positioning: Upright in chair/Tumbleform Baseline Vocal Quality: Normal Volitional Cough: Strong Volitional Swallow: Able to elicit  Ice Chips Ice chips: Not tested Thin Liquid Thin Liquid: Impaired Presentation: Cup Oral Phase Functional Implications: Right anterior spillage Pharyngeal  Phase Impairments: Suspected delayed Swallow Nectar Thick Nectar Thick Liquid: Not tested Honey Thick Honey Thick Liquid: Not tested Puree Puree: Impaired Presentation: Self Fed;Spoon Oral Phase Functional Implications: Right anterior spillage;Right lateral sulci pocketing;Prolonged oral transit Pharyngeal Phase Impairments: Suspected delayed Swallow Solid Solid: Impaired Presentation: Self Fed Oral Phase Impairments: Impaired mastication Oral Phase Functional Implications: Right anterior spillage;Right lateral sulci pocketing;Prolonged oral transit Pharyngeal Phase Impairments: Suspected delayed Swallow BSE Assessment Risk for  Aspiration Impact on safety and function: Mild aspiration risk;Moderate aspiration risk Other Related Risk Factors: Decreased management of secretions;Cognitive impairment;Deconditioning  Short Term Goals: Week 1: SLP Short Term Goal 1 (Week 1): Patient will consume current diet with minimal overt s/s of aspiration and supervision level verbal cues for use of swallowing compensatory strategies. SLP Short Term Goal 2 (Week 1): Patient will utilize external memory aids to recall functional information with Mod verbal and visual cues. SLP Short Term Goal 3 (Week 1): Patient will demonstrate functional problem solving with overall Min A verbal cues. SLP Short Term Goal 4 (Week 1): Patient will demonstrate sustained attention to functional tasks for 30 minutes with supervision verbal cues for redirection. SLP Short Term Goal 5 (Week 1): Patient will identify 1 physical and 1 cognitive deficit with Min verbal cues.  Refer to Care Plan for Long Term Goals  Recommendations for other services: Neuropsych  Discharge Criteria: Patient will be discharged from SLP if patient refuses treatment 3 consecutive times without medical reason, if treatment goals not met, if there is a change in medical status, if patient makes no progress towards goals or if patient is discharged from hospital.  The above assessment, treatment plan, treatment alternatives and goals were discussed and mutually agreed upon: by patient  Geoffrey Benjamin 10/08/2019, 1:18 PM

## 2019-10-09 ENCOUNTER — Inpatient Hospital Stay (HOSPITAL_COMMUNITY): Payer: Medicare Other | Admitting: Speech Pathology

## 2019-10-09 ENCOUNTER — Encounter (HOSPITAL_COMMUNITY): Payer: Medicare Other | Admitting: Psychology

## 2019-10-09 ENCOUNTER — Inpatient Hospital Stay (HOSPITAL_COMMUNITY): Payer: Medicare Other | Admitting: Physical Therapy

## 2019-10-09 ENCOUNTER — Inpatient Hospital Stay (HOSPITAL_COMMUNITY): Payer: Medicare Other | Admitting: Occupational Therapy

## 2019-10-09 LAB — GLUCOSE, CAPILLARY
Glucose-Capillary: 221 mg/dL — ABNORMAL HIGH (ref 70–99)
Glucose-Capillary: 232 mg/dL — ABNORMAL HIGH (ref 70–99)
Glucose-Capillary: 308 mg/dL — ABNORMAL HIGH (ref 70–99)
Glucose-Capillary: 198 mg/dL — ABNORMAL HIGH (ref 70–99)

## 2019-10-09 MED ORDER — SENNOSIDES-DOCUSATE SODIUM 8.6-50 MG PO TABS
1.0000 | ORAL_TABLET | Freq: Two times a day (BID) | ORAL | Status: DC
  Administered 2019-10-09 – 2019-10-16 (×14): 1 via ORAL
  Filled 2019-10-09 (×14): qty 1

## 2019-10-09 NOTE — Progress Notes (Signed)
Physical Therapy Session Note  Patient Details  Name: Geoffrey Benjamin MRN: [id number] Date of Birth: May 31, 1941  Today's Date: 10/09/2019 PT Individual Time: 0909-1006 and 1305-1330 PT Individual Time Calculation (min): 57 min and 25 min  Short Term Goals: Week 1:  PT Short Term Goal 1 (Week 1): STG = LTG due to short ELOS.  Skilled Therapeutic Interventions/Progress Updates:  Treatment 1: Pt received in recliner reporting fatigue but agreeable to tx. Pt transfers sit<>stand with CGA and stands at sink to brush hair with close supervision. Transported pt to gym via w/c dependent assist for time management. Pt ambulates around dayroom/nurses station without AD & min assist with decreased R hip/knee flexion & dorsiflexion during swing phase with pt slowly demonstrating slightly increased hip/knee flexion as distance progressed. Pt reports SOB at rest & SpO2 = 99-100% on room air, HR = 103-104 bpm; pt educated on pursed lip breathing. RN made aware of elevated HR at rest with instructions to check BP. BP = 108/70 mmHg (RUE, sitting). Pt utilized cybex kinetron in standing with BUE support and min assist with forward flexed posture despite cuing to correct and task focusing on weight shifting, R hip/knee flexion, dynamic balance, & R NMR. Pt reports need to use restroom so returned to room & pt ambulates in room/bathroom without AD & min assist. Pt manages pants without assistance & performs toilet transfer with CGA. Pt has continent void on toilet and therapist assists with donning new brief. Pt performs hand hygiene standing at sink & doesn't require cuing to initiate & attend to washing R hand, which is an improvement compared to yesterday. Pt performed 10x sit<>stand from recliner with CGA & LUE with task focusing on BLE strengthening. Provided pt with cup of coffee upon his request. Pt left in recliner with chair alarm donned & all needs at hand.  Treatment 2: Pt received in recliner with RN present  administering meds. Pt agreeable to tx. Pt transfers sit<>stand with CGA, no AD. Pt ambulates room<>dayroom without AD & min assist with pt demonstrating increased weight shifting L and very minimal RLE hip/knee flexion, dorsiflexion & heel strike RLE but with slight improvement in hip/knee flexion as distance progressed. Pt utilized nu-step on level 2 x 10 minutes with all four extremities with task focusing on coordination of global strengthening & endurance training, reciprocal movements, R UE/LE NMR, and attention to R side. Pt's R hand frequently slipping off handle but majority of the time pt is aware & able to correct. Pt reports SOB during activity but SpO2 = 93% on room air, HR = 107 bpm & therapist educates pt on pursed lip breathing during activity. At end of session pt left in recliner with chair alarm donned & all needs at hand. No c/o pain reported.  Therapy Documentation Precautions:  Precautions Precautions: Fall Precaution Comments: R inattention Restrictions Weight Bearing Restrictions: No    Therapy/Group: Individual Therapy  [name] 10/09/2019, 2:02 PM

## 2019-10-09 NOTE — Progress Notes (Signed)
Speech Language Pathology Daily Session Notes  Patient Details  Name: Geoffrey Benjamin MRN: [id number] Date of Birth: 01/31/1941  Today's Date: 10/09/2019  Session 1: SLP Individual Time: 6237-6283 SLP Individual Time Calculation (min): 20 min   Session 2: SLP Individual Time:  1517-6160 SLP Individual Time Calculation (min): 35 min  Short Term Goals: Week 1: SLP Short Term Goal 1 (Week 1): Patient will consume current diet with minimal overt s/s of aspiration and supervision level verbal cues for use of swallowing compensatory strategies. SLP Short Term Goal 2 (Week 1): Patient will utilize external memory aids to recall functional information with Mod verbal and visual cues. SLP Short Term Goal 3 (Week 1): Patient will demonstrate functional problem solving with overall Min A verbal cues. SLP Short Term Goal 4 (Week 1): Patient will demonstrate sustained attention to functional tasks for 30 minutes with supervision verbal cues for redirection. SLP Short Term Goal 5 (Week 1): Patient will identify 1 physical and 1 cognitive deficit with Min verbal cues.  Skilled Therapeutic Interventions:  Session 1: Skilled treatment session focused on dysphagia goals. SLP facilitated session by providing skilled observation of swallowing function throughout the entirety of a meal. Patient demonstrated efficient mastication and complete oral clearance with Dys. 2 textures with only minimal anterior spillage that patient self-monitored and corrected independently with both solids and liquids. No overt s/s of aspiration were observed and patient reported, "I think you picked out the right food." Recommend patient continue current diet with intermittent supervision. Patient left upright in recliner with alarm on and all needs within reach. Continue with current plan of care.   Session 2: Skilled treatment session focused on cognitive goals. SLP facilitated session by providing overall supervision level verbal cues  for basic problem solving during a money management task. Patient also required Min A verbal cues for sustained attention to task for ~30 minutes. Patient left upright in recliner with all needs within reach and alarm on. Continue with current plan of care.  Pain No/Denies Pain   Therapy/Group: Individual Therapy  Rilea Arutyunyan 10/09/2019, 7:14 AM

## 2019-10-09 NOTE — Progress Notes (Signed)
Occupational Therapy Session Note  Patient Details  Name: Geoffrey Benjamin MRN: [id number] Date of Birth: [birth year]  Today's Date: 10/09/2019 OT Individual Time: 1415-1500 OT Individual Time Calculation (min): 45 min    Short Term Goals: Week 1:  OT Short Term Goal 1 (Week 1): Pt will complete toilet transfer with CGA OT Short Term Goal 2 (Week 1): Pt will complete 2/3 toileting tasks OT Short Term Goal 3 (Week 1): Pt will integrate R UE into bathing tasks with min questioning cues  Skilled Therapeutic Interventions/Progress Updates:    1:1 Pt received in recliner. Pt ambulated with RW to the dayroom with min guard with cues for attention right foot clearance and not to "scuff" shoe on floor. At table engaged in [facility] activities including pincher grasp, visual attention to right hand with functional use and in hand manipulation with different shaped small objects.  Ambulated back to room without AD with min A with facilitation for greater weight shift to left to clear right foot. Pt perform toileting with mod A sit to stand (A to pull up pants). Pt left resting  In recliner. Discussed with wife bring in more clothing for pt as well as cutting his nails might help with working on [facility]; she was agreeable.   Therapy Documentation Precautions:  Precautions Precautions: Fall Precaution Comments: R inattention Restrictions Weight Bearing Restrictions: No Pain:  no c/o pain in session   Therapy/Group: Individual Therapy  Willeen Cass [facility] 10/09/2019, 8:34 PM

## 2019-10-09 NOTE — Progress Notes (Signed)
[place] PHYSICAL MEDICINE & REHABILITATION PROGRESS NOTE   Subjective/Complaints: Appears more coherent and less confused this morning than when seen in acute care. States that he has less back pain. Has to urinate now and has used call bell. States that he has not been having regular BM and he would be agreeable to a stool softener.   ROS: Patient denies fever, rash, sore throat, blurred vision, nausea, vomiting, diarrhea, cough, shortness of breath or chest pain, joint or back pain, headache     Objective:   No results found. Recent Labs    10/08/19 0629  WBC 14.1*  HGB 13.3  HCT 39.4  PLT 267   Recent Labs    10/08/19 0629  NA 137  K 4.4  CL 97*  CO2 29  GLUCOSE 198*  BUN 15  CREATININE 0.85  CALCIUM 9.2    Intake/Output Summary (Last 24 hours) at 10/09/2019 0902 Last data filed at 10/08/2019 1730 Gross per 24 hour  Intake 181 ml  Output --  Net 181 ml     Physical Exam: Vital Signs Blood pressure 114/60, pulse 79, temperature 98 F (36.7 C), resp. rate 18, height 6' (1.829 m), weight 69.4 kg. Constitutional: No distress . Vital signs reviewed. Frail appearing HEENT: EOMI, oral membranes moist Neck: supple Cardiovascular: RRR with murmur. No JVD    Respiratory: CTA Bilaterally without wheezes or rales. Normal effort    GI: BS +, non-tender, non-distended  Extremities: No clubbing, cyanosis, or edema. Pulses are 2+ Skin: Clean and intact without signs of breakdown Neuro: Alert to name and location. Follows simple commands. Described last night's experience to me and SLP. Musculoskeletal:   Appears to be 4/5 UE, BLE grossly 3+ to 4/5 prox to distal--inconsistent effort. ?mild apraxia vs processing delays Psych: smiling, generally up beat and pleasant  Assessment/Plan: 1. 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  Physiatrist is providing close team supervision and 24 hour management of active medical problems listed below.  Physiatrist and rehab team continue to assess barriers to discharge/monitor patient progress toward functional and medical goals  Care Tool:  Bathing    Body parts bathed by patient: Right arm, Chest, Abdomen, Front perineal area, Right upper leg, Left upper leg, Face   Body parts bathed by helper: Left lower leg, Right lower leg, Buttocks, Left arm     Bathing assist Assist Level: Moderate Assistance - Patient 50 - 74%     Upper Body Dressing/Undressing Upper body dressing   What is the patient wearing?: Pull over shirt    Upper body assist Assist Level: Moderate Assistance - Patient 50 - 74%    Lower Body Dressing/Undressing Lower body dressing      What is the patient wearing?: Pants     Lower body assist Assist for lower body dressing: Moderate Assistance - Patient 50 - 74%     Toileting Toileting Toileting Activity did not occur Landscape architect and hygiene only): N/A (no void or bm)  Toileting assist Assist for toileting: Minimal Assistance - Patient > 75%     Transfers Chair/bed transfer  Transfers assist     Chair/bed transfer assist level: Minimal Assistance - Patient > 75%     Locomotion Ambulation   Ambulation assist      Assist level: Minimal Assistance - Patient > 75% Assistive device: Walker-rolling Max distance: 15 ft  Walk 10 feet activity   Assist     Assist level: Minimal Assistance - Patient > 75% Assistive device: Walker-rolling   Walk 50 feet activity   Assist Walk 50 feet with 2 turns activity did not occur: Safety/medical concerns         Walk 150 feet activity   Assist Walk 150 feet activity did not occur: Safety/medical concerns         Walk 10 feet on uneven surface  activity   Assist Walk 10 feet on uneven surfaces activity did not occur: Safety/medical  concerns         Wheelchair     Assist Will patient use wheelchair at discharge?: No   Wheelchair activity did not occur: Safety/medical concerns         Wheelchair 50 feet with 2 turns activity    Assist    Wheelchair 50 feet with 2 turns activity did not occur: Safety/medical concerns       Wheelchair 150 feet activity     Assist  Wheelchair 150 feet activity did not occur: Safety/medical concerns       Blood pressure 114/60, pulse 79, temperature 98 F (36.7 C), resp. rate 18, height 6' (1.829 m), weight 69.4 kg.  Medical Problem List and Plan: 1.  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  Decadron taper as directed             -patient may shower             -ELOS/Goals: 12-16 days, S with PT, OT, and SLP 2.  Antithrombotics: -DVT/anticoagulation: Eliquis 2.5mg  BID             -antiplatelet therapy: aspirin 81 mg daily 3. Pain Management: Tylenol as needed.              Ibuprofen 200mg  BID prn for headache. 4. Mood/HS confusion: likely multifactorial given cva/mets/steroids/keppra/mood  -continue Remeron 15 mg nightly   -dc rozerem  -trial of seroquel 12.5mg  tonight at 9pm given delusional behaviors  -keep sleep chart             -antipsychotic agents: N/A  -neuropsych assessment would be helpful  1/6: Appears core coherent and less confused this morning, Seroquel HS seems to have helped. 5. Neuropsych: This patient is capable of making decisions on his own behalf. 6. Skin/Wound Care: Routine skin checks 7. Fluids/Electrolytes/Nutrition: Routine in and outs with follow-up chemistries. Dysphagia 3 diet, thins. Ensure Enlive supplements.  -I personally reviewed the patient's labs today.    8.  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  Status post stereotactic radiosurgery September 19, 2019 with baseline right side weakness.  Follow-up  outpatient Dr. Earlie Server 9.  Seizure disorder.  Keppra 1000 mg every 12 hours. 10.  Hyperlipidemia.  Lipitor 11.  Diabetes mellitus.    Hemoglobin A1c 7.3.   -cover with SSI as decadron tapered 12.  Tobacco abuse.  NicoDerm patch.  Provide counseling 13.  Diastolic congestive heart failure.  Monitor for any signs of fluid overload  -need daily weights Filed Weights   10/07/19 1436 10/09/19 0700  Weight: 69.1 kg 69.4 kg    14.  Hypertension.  Hydrochlorothiazide 6.25 mg daily. 15. Cough/chest congestion: prn Robitussin 16. Constipation: Patient says he has not had BM in a few days and last documented was 1/3. Will start senna-docusate BID.   CODE STATUS: Limited code Permitted: rapid response and ACLS No CPR,  intubation, BiPAP, cardioversion    LOS: 2 days A FACE TO FACE EVALUATION WAS PERFORMED  Skii Cleland P Jeena Arnett 10/09/2019, 9:02 AM

## 2019-10-09 NOTE — Consult Note (Signed)
Neuropsychological Consultation   Patient:   Geoffrey Benjamin   DOB:   01/21/1941  MR Number:  [id number]  Location:  [facility] [id number] [place] [address] Dept: [place]: [phone]           Date of Service:   10/09/2019  Start Time:   11 AM End Time:   12 PM  Provider/Observer:  Ilean Skill, Psy.D.       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  Patient with history of diabetes, hypertension, heart failure, and prior alcohol/tobacco use.  Presented 10/01/2019 with seizure like symptoms, mostly on right side.  Improved with versed in ED.  Multiple intracranial metastasis noted in left parietal lobe.  Mass effect with partial effacement of the left lateral ventricle.  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  Ongoing Oncology care to be done outpatient.     Reason for Service:  Patient referred for neuropsychological consultation due to coping and adjustment.  Below is the HPI for the current admission.  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 Earlie Server and Dr. Lisbeth Renshaw at the [facility] cancer [facility].  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  Per chart review patient lives with spouse.  1 level home 4 steps to entry.  He did  use a rolling walker at times.  Presented 10/01/2019 with repetitive twitching and jerking-like movements to the right upper extremity and right facial twitching.  Patient did receive 7.5 mg of Versed on route to the ED with noticeable improvement.  Cranial CT scan showed extensive vasogenic edema within the left cerebral hemisphere similar to prior MRI on 07/03/2019.  Multiple intracranial metastasis including a dominant 2.2 cm left parietal lobe metastasis.  Redemonstrated mass-effect with partial effacement of the left lateral ventricle.  4 mm rightward midline shift.  No acute intracranial hemorrhage.  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  No new mets identified within limitations of motion artifact.  Currently maintained on Eliquis for CVA prophylaxis.  Echocardiogram with ejection fraction of 65%.  Patient was loaded with Keppra as well as Decadron taper as indicated.  Palliative care consulted to establish goals of care.  Oncology service Dr. Julien Nordmann recommends follow-up outpatient.  Tolerating mechanical soft diet.  Therapy evaluations completed and patient was admitted for a comprehensive rehab program.  Current Status:  Patient reports relatively speaking good mood and is coping with all the medical issues.  Motivated to work in therapies and make functional gains.  Concerned about ongoing issues with cancer and metastasis of lung cancer.  Behavioral Observation: Geoffrey Benjamin  presents as a 79 y.o.-year-old Right Caucasian Male who appeared his stated age. his dress was Appropriate and he was Well Groomed and his manners were Appropriate to the situation.  his participation was  indicative of Appropriate and Redirectable behaviors.  There were any physical disabilities noted.  he displayed an appropriate level of cooperation and motivation.     Interactions:    Active Appropriate and Redirectable  Attention:   abnormal and attention span appeared shorter than expected for age  Memory:   within normal limits; recent and remote memory intact  Visuo-spatial:  not examined  Speech (Volume):  normal  Speech:   normal; normal  Thought Process:  Coherent and Relevant  Though Content:  WNL; not suicidal and not homicidal  Orientation:   person, place, time/date and situation  Judgment:   Fair  Planning:   Poor  Affect:    Appropriate  Mood:    Dysphoric  Insight:   Fair  Intelligence:   normal  Medical History:   Past Medical History:  Diagnosis Date  . Aortic stenosis 09/13/2018  . CAP (community acquired pneumonia) 09/13/2018  . CHF (congestive heart failure) ([place])   . Difficulty walking   . DM (diabetes mellitus) ([place])   . GERD 06/04/2009   Qualifier: Diagnosis of  By: Henrene Pastor MD, Docia Chuck   . Hyperlipidemia 09/13/2018  . Hypertension   . Loss of weight 09/29/2008   Qualifier: Diagnosis of  By: Bobby Rumpf CMA (AAMA), Patty    . Non-small cell carcinoma of right lung, stage 4 ([place])   . PERSONAL HX COLONIC POLYPS 06/04/2009   Qualifier: Diagnosis of  By: Henrene Pastor MD, [place] PAIN 09/29/2008   Qualifier: Diagnosis of  By: Bobby Rumpf CMA (AAMA), Patty    . Tachycardia   . Tobacco use 09/13/2018  . Volume overload 09/13/2018   Psychiatric History:  No prior psychiatric history  Family Med/Psych History:  Family History  Problem Relation Age of Onset  . Kidney disease Father   . 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  Patient with history of diabetes, hypertension,  heart failure, and prior alcohol/tobacco use.  Presented 10/01/2019 with seizure like symptoms, mostly on right side.  Improved with versed in ED.  Multiple intracranial metastasis noted in left parietal lobe.  Mass effect with partial effacement of the left lateral ventricle.  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  Ongoing Oncology care to be done outpatient.     Patient reports relatively speaking good mood and is coping with all the medical issues.  Motivated to work in therapies and make functional gains.  Concerned about ongoing issues with cancer and metastasis of lung cancer.    Disposition/Plan:  Worked on coping and understanding current status and rehab efforts.    Diagnosis:    Metastatic adenocarcinoma ([place]) - Plan: Ambulatory referral to Neurology         Electronically Signed   _______________________ Ilean Skill, Psy.D.

## 2019-10-10 ENCOUNTER — Inpatient Hospital Stay (HOSPITAL_COMMUNITY): Payer: Medicare Other | Admitting: Speech Pathology

## 2019-10-10 ENCOUNTER — Inpatient Hospital Stay (HOSPITAL_COMMUNITY): Payer: Medicare Other

## 2019-10-10 ENCOUNTER — Telehealth: Payer: Self-pay | Admitting: Cardiovascular Disease

## 2019-10-10 ENCOUNTER — Inpatient Hospital Stay (HOSPITAL_COMMUNITY): Payer: Medicare Other | Admitting: Occupational Therapy

## 2019-10-10 LAB — GLUCOSE, CAPILLARY
Glucose-Capillary: 226 mg/dL — ABNORMAL HIGH (ref 70–99)
Glucose-Capillary: 269 mg/dL — ABNORMAL HIGH (ref 70–99)
Glucose-Capillary: 322 mg/dL — ABNORMAL HIGH (ref 70–99)
Glucose-Capillary: 265 mg/dL — ABNORMAL HIGH (ref 70–99)

## 2019-10-10 MED ORDER — BISACODYL 10 MG RE SUPP: 10.0000 mg | Freq: Every day | RECTAL | Status: DC | PRN

## 2019-10-10 NOTE — Progress Notes (Signed)
[place] PHYSICAL MEDICINE & REHABILITATION PROGRESS NOTE   Subjective/Complaints: Thanked me for new sleeping med. Says that last two nights were better. Hasn't felt drowsy in the morning. Slept most of the night per sleep chart  ROS: Patient denies fever, rash, sore throat, blurred vision, nausea, vomiting, diarrhea, cough, shortness of breath or chest pain,  headache, or mood change.       Objective:   No results found. Recent Labs    10/08/19 0629  WBC 14.1*  HGB 13.3  HCT 39.4  PLT 267   Recent Labs    10/08/19 0629  NA 137  K 4.4  CL 97*  CO2 29  GLUCOSE 198*  BUN 15  CREATININE 0.85  CALCIUM 9.2    Intake/Output Summary (Last 24 hours) at 10/10/2019 0953 Last data filed at 10/10/2019 0855 Gross per 24 hour  Intake 680 ml  Output --  Net 680 ml     Physical Exam: Vital Signs Blood pressure 110/65, pulse 98, temperature 98 F (36.7 C), resp. rate 20, height 6' (1.829 m), weight 69.4 kg, SpO2 93 %. Constitutional: No distress . Vital signs reviewed. HEENT: EOMI, oral membranes moist Neck: supple Cardiovascular: RRR without murmur. No JVD    Respiratory: CTA Bilaterally without wheezes or rales. Normal effort    GI: BS +, non-tender, non-distended    Extremities: No clubbing, cyanosis, or edema. Pulses are 2+ Skin: Clean and intact without signs of breakdown Neuro: Alert to name and location, reason he's here. Follows simple commands.    Appears to be 4/5 UE, BLE grossly 3+ to 4/5, apraxia Psych: smiling, cooperative  Assessment/Plan: 1. 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  Physiatrist is providing close team supervision and 24 hour management of active medical problems listed below.  Physiatrist and rehab team continue to assess barriers to discharge/monitor patient progress toward  functional and medical goals  Care Tool:  Bathing    Body parts bathed by patient: Right arm, Chest, Abdomen, Front perineal area, Right upper leg, Left upper leg, Face   Body parts bathed by helper: Left lower leg, Right lower leg, Buttocks, Left arm     Bathing assist Assist Level: Moderate Assistance - Patient 50 - 74%     Upper Body Dressing/Undressing Upper body dressing   What is the patient wearing?: Pull over shirt    Upper body assist Assist Level: Moderate Assistance - Patient 50 - 74%    Lower Body Dressing/Undressing Lower body dressing      What is the patient wearing?: Pants     Lower body assist Assist for lower body dressing: Moderate Assistance - Patient 50 - 74%     Toileting Toileting Toileting Activity did not occur Landscape architect and hygiene only): N/A (no void or bm)  Toileting assist Assist for toileting: Contact Guard/Touching assist     Transfers Chair/bed transfer  Transfers assist     Chair/bed transfer assist level: Supervision/Verbal cueing     Locomotion Ambulation   Ambulation assist      Assist level: Contact Guard/Touching assist Assistive device: Walker-rolling Max distance: 125 ft   Walk 10 feet activity   Assist     Assist level: Minimal Assistance - Patient > 75% Assistive device: No Device   Walk 50 feet activity   Assist Walk 50 feet with 2 turns activity did not occur: Safety/medical  concerns  Assist level: Minimal Assistance - Patient > 75% Assistive device: No Device    Walk 150 feet activity   Assist Walk 150 feet activity did not occur: Safety/medical concerns         Walk 10 feet on uneven surface  activity   Assist Walk 10 feet on uneven surfaces activity did not occur: Safety/medical concerns         Wheelchair     Assist Will patient use wheelchair at discharge?: No   Wheelchair activity did not occur: Safety/medical concerns         Wheelchair 50 feet with  2 turns activity    Assist    Wheelchair 50 feet with 2 turns activity did not occur: Safety/medical concerns       Wheelchair 150 feet activity     Assist  Wheelchair 150 feet activity did not occur: Safety/medical concerns       Blood pressure 110/65, pulse 98, temperature 98 F (36.7 C), resp. rate 20, height 6' (1.829 m), weight 69.4 kg, SpO2 93 %.  Medical Problem List and Plan: 1.  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  Decadron taper as directed             -patient may shower             -ELOS/Goals: 12-16 days, S with PT, OT, and SLP 2.  Antithrombotics: -DVT/anticoagulation: Eliquis 2.5mg  BID             -antiplatelet therapy: aspirin 81 mg daily 3. Pain Management: Tylenol as needed.              Ibuprofen 200mg  BID prn for headache. 4. Mood/HS confusion: likely multifactorial given cva/mets/steroids/keppra/mood  -continue Remeron 15 mg nightly   -dc'ed rozerem  -  seroquel 12.5mg  nightly at 9pm with improved sleep patterns, decreased confusion  -continue sleep chart             -antipsychotic agents: N/A  -neuropsych assessment would be helpful    5. Neuropsych: This patient is capable of making decisions on his own behalf. 6. Skin/Wound Care: Routine skin checks 7. Fluids/Electrolytes/Nutrition: Routine in and outs with follow-up chemistries. Dysphagia 3 diet, thins. Ensure Enlive supplements.  -encourager PO.    8.  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  Status post stereotactic radiosurgery September 19, 2019 with baseline right side weakness.  Follow-up outpatient Dr. Earlie Server 9.  Seizure disorder.  Keppra 1000 mg every 12 hours. 10.  Hyperlipidemia.  Lipitor 11.  Diabetes mellitus.    Hemoglobin A1c 7.3.   -cover with SSI as decadron tapered 12.  Tobacco abuse.  NicoDerm patch.  Provide counseling 13.  Diastolic congestive  heart failure.  Monitor for any signs of fluid overload  -weights stable thus far Filed Weights   10/07/19 1436 10/09/19 0700  Weight: 69.1 kg 69.4 kg    14.  Hypertension.  Hydrochlorothiazide 6.25 mg daily. 15. Cough/chest congestion: prn Robitussin 16. Constipation:  BM last documented was 1/3. Started senna-docusate BID.   1/7-still no bm. Sorbitol today  -dulcolax suppository   CODE STATUS: Limited code Permitted: rapid response and ACLS No CPR, intubation, BiPAP, cardioversion    LOS: 3 days A FACE TO [place] 10/10/2019, 9:53 AM

## 2019-10-10 NOTE — Telephone Encounter (Signed)
Would have to go on coumadin if they want something cheaper Geoffrey Benjamin can check into patient assistance

## 2019-10-10 NOTE — Telephone Encounter (Signed)
New Message  Pt c/o medication issue:  1. Name of Medication: apixaban (ELIQUIS) tablet 2.5 mg  2. How are you currently taking this medication (dosage and times per day)? Not taking  3. Are you having a reaction (difficulty breathing--STAT)? No  4. What is your medication issue? Patient's wife states that medication is too expensive and they can not afford it. They would like a more affordable medication or some assistance with medication. Please give a call back to discuss.

## 2019-10-10 NOTE — Progress Notes (Signed)
Speech Language Pathology Daily Session Note  Patient Details  Name: Geoffrey Benjamin MRN: [id number] Date of Birth: 1941/04/11  Today's Date: 10/10/2019  Session 1: SLP Individual Time: 2637-8588 SLP Individual Time Calculation (min): 60 min   Session 2: SLP Individual Time: 1435-1500 SLP Individual Time Calculation (min): 25 min  Short Term Goals: Week 1: SLP Short Term Goal 1 (Week 1): Patient will consume current diet with minimal overt s/s of aspiration and supervision level verbal cues for use of swallowing compensatory strategies. SLP Short Term Goal 2 (Week 1): Patient will utilize external memory aids to recall functional information with Mod verbal and visual cues. SLP Short Term Goal 3 (Week 1): Patient will demonstrate functional problem solving with overall Min A verbal cues. SLP Short Term Goal 4 (Week 1): Patient will demonstrate sustained attention to functional tasks for 30 minutes with supervision verbal cues for redirection. SLP Short Term Goal 5 (Week 1): Patient will identify 1 physical and 1 cognitive deficit with Min verbal cues.  Skilled Therapeutic Interventions:  Session 1: Skilled treatment session focused on cognitive goals. SLP facilitated session by providing supervision verbal cues for problem solving and hand over hand assist for use of RUE during a pipe tree task. Patient with low frustration tolerance at times but continues to stay motivated. Patient asking this clinician questions in regards to goals of rehab and potential plan after discharge. Educated patient that goals were to get him home to where he can safely perform basic and functional tasks and unaware of any "procedure" (per his report) they may keep him here and that all f/u appointments will happen as an outpatient but to f/u with his rehab physician for clarification. Patient verbalized understanding. Patient requested to use the bathroom and ambulated with the RW with min verbal cues needed for  attention/placement of RUE. Patient left upright in recliner with alarm on and all needs within reach. Continue with current plan of care.   Session 2: Skilled treatment session focused on cognitive goals and family education. Upon arrival, patient's wife was present and educated in regards to patient's current swallowing function, diet recommendations, appropriate textures and medication administration. She verbalized understanding of all information.  SLP also discussed patient's current cognitive functioning and goals of SLP intervention, she verbalized understanding but patient reported he will "give [place] 5 more days to see where this goes" and then plans on discharging.  Patient provided support and encouragement. Patient left upright in recliner with all needs within reach, alarm on and wife present. Continue with current plan of care.   Pain No/Denies Pain   Therapy/Group: Individual Therapy  [name] 10/10/2019, 9:52 AM

## 2019-10-10 NOTE — Progress Notes (Signed)
Physical Therapy Session Note  Patient Details  Name: Geoffrey Benjamin MRN: [id number] Date of Birth: 1940-11-23  Today's Date: 10/10/2019 PT Individual Time: 1330-1430 PT Individual Time Calculation (min): 60 min   Short Term Goals: Week 1:  PT Short Term Goal 1 (Week 1): STG = LTG due to short ELOS.  Skilled Therapeutic Interventions/Progress Updates:    Pt sitting in recliner with feet up on seat.  He c/o of his "tail bone" hurting a lttle bit.  He declined pain meds until therapy is over for the day.  Pt reported that he needed to use toilet.  Sit> stand and gait into bathroom iwht min assist.  Pt continent of bladder on toilet.  Hand washing in sitting at sink with supervision, no cues.  neuromuscular re-education via forced use, multimodal cues for with use of NuStep at level 4, x 4 minutes with 4 extremities> 3 extremities (discontinued using  LUE) to focus on use of RUE more.  This was pt's idea.  Gait training without AD, HHA on pt's R, x 250' with min assist.  No LOB, but R foot began to drag slightly with fatigue.  O2 sats 92% on room air; HR 83. Up/down (4) 6" high steps, L rail, with min assist.  Pt self- selected step- to leading with L foot ascending and descending.  With cues, he switched to descending with R LE first.  Seated activity, grasping horseshoes with R hand in front of him, and placing on another table to his R, x 6, with cues to visually follow movement, with improvementt in grasping.  With gloves donned, pt wiped down 6 horseshoes using bil hands; hand -over- hand assist to incorporate R hand.  At end of session, pt resting reclined in recliner iwht bil LEs elevated.  Seat belt alarm set and needs at hand.  Therapy Documentation Precautions:  Precautions Precautions: Fall Precaution Comments: R inattention Restrictions Weight Bearing Restrictions: No       Therapy/Group: Individual Therapy  Carroll Lingelbach 10/10/2019, 4:41 PM

## 2019-10-10 NOTE — Telephone Encounter (Signed)
Called patient's wife (DPR) informed her of Dr. Kyla Balzarine advisement. Since patient is currently in the hospital informed patient's wife to discuss patient getting on coumadin with the hospital doctor. Patient's wife stated she would talk to his doctor at the hospital.

## 2019-10-10 NOTE — Progress Notes (Signed)
Occupational Therapy Session Note  Patient Details  Name: Geoffrey Benjamin MRN: [id number] Date of Birth: [birth year]  Today's Date: 10/10/2019 OT Individual Time: 2426-8341 OT Individual Time Calculation (min): 57 min   Short Term Goals: Week 1:  OT Short Term Goal 1 (Week 1): Pt will complete toilet transfer with CGA OT Short Term Goal 2 (Week 1): Pt will complete 2/3 toileting tasks OT Short Term Goal 3 (Week 1): Pt will integrate R UE into bathing tasks with min questioning cues  Skilled Therapeutic Interventions/Progress Updates:    Pt greeted seated in recliner and agreeable to OT treatment session. Pt reported need to urinate first. Pt completed sit<>stand w/ RW and Min A with verbal cues for R hand placement on RW. Pt ambulated into bathroom w/ RW and min A. Pt  Completed toileting with CGA for balance when standing for clothing management. OT educated on RW positioning at the sink with min A. Max verbal cues to reach wiith R hand to collect soap. B UE task with handwashing. OT pushed pt down to therapy gym for time managemnt. OT provided pt with wc cushion for positioning and support under bottom. Worked on functional use of R UE and R attention with dynavision activity using only R hand. Focus on finger isolation and guided A to reach upper R qudrant 2/2 weakness and motor planning deficits. B UE coordination with folding activity using large towels, then smaller wash cloths. This activity was a good level of challenge as pt did not get as frustrated with folding tasks and it was still quite challenging. Pt returned to room in wc at end of session and completed stand-pivot back to recliner with CGA. Pt left with alarm belt on, call bell in reach, and needs met.   Therapy Documentation Precautions:  Precautions Precautions: Fall Precaution Comments: R inattention Restrictions Weight Bearing Restrictions: No Pain:   denies pain  Therapy/Group: Individual Therapy  Valma Cava 10/10/2019, 10:11 AM

## 2019-10-10 NOTE — IPOC Note (Signed)
Overall Plan of Care [facility]) Patient Details Name: Geoffrey Benjamin MRN: [id number] DOB: 1941-07-18  Admitting Diagnosis: Metastatic adenocarcinoma [facility])  Hospital Problems: Principal Problem:   Metastatic adenocarcinoma ([place])     Functional Problem List: Nursing Endurance, Medication Management, Motor, Pain, Safety, Skin Integrity  PT Balance, Behavior, Safety, Perception, Endurance, Motor  OT Balance, Cognition, Endurance, Motor, Pain, Perception, Safety, Sensory  SLP Cognition, Safety, Nutrition  TR         Basic ADL's: OT Eating, Grooming, Dressing, Bathing, Toileting     Advanced  ADL's: OT       Transfers: PT Bed Mobility, Bed to Chair, Car, Manufacturing systems engineer, Metallurgist: PT Stairs, Ambulation, Emergency planning/management officer     Additional Impairments: OT Fuctional Use of Upper Extremity  SLP Swallowing, Communication, Social Cognition expression Problem Solving, Memory, Attention, Awareness  TR      Anticipated Outcomes Item Anticipated Outcome  Self Feeding Mod I  Swallowing  Supervision   Basic self-care  Media planner Transfers Supervision  Bowel/Bladder  Min assist  Transfers  supervision with LRAD  Locomotion  supervision with LRAD  Communication  Mod I  Cognition  Min A  Pain  < 3  Safety/Judgment  Supervision   Therapy Plan: PT Intensity: Minimum of 1-2 x/day ,45 to 90 minutes PT Frequency: 5 out of 7 days PT Duration Estimated Length of Stay: 7-9 days OT Intensity: Minimum of 1-2 x/day, 45 to 90 minutes OT Frequency: 5 out of 7 days OT Duration/Estimated Length of Stay: 12-14 days SLP Intensity: Minumum of 1-2 x/day, 30 to 90 minutes SLP Frequency: 3 to 5 out of 7 days SLP Duration/Estimated Length of Stay: 12-14 days   Due to the current state of emergency, patients may not be receiving their 3-hours of Medicare-mandated therapy.   Team Interventions: Nursing Interventions  Patient/Family Education, Dysphagia/Aspiration Precaution Training, Medication Management, Skin Care/Wound Management, Disease Management/Prevention, Cognitive Remediation/Compensation  PT interventions Ambulation/gait training, Community reintegration, DME/adaptive equipment instruction, Neuromuscular re-education, Psychosocial support, Stair training, UE/LE Strength taining/ROM, Wheelchair propulsion/positioning, Training and development officer, Discharge planning, Functional electrical stimulation, Pain management, Skin care/wound management, Therapeutic Activities, UE/LE Coordination activities, Visual/perceptual remediation/compensation, Therapeutic Exercise, Splinting/orthotics, Patient/family education, Functional mobility training, Cognitive remediation/compensation, Disease management/prevention  OT Interventions Training and development officer, Cognitive remediation/compensation, Community reintegration, Discharge planning, Disease mangement/prevention, DME/adaptive equipment instruction, Functional mobility training, Functional electrical stimulation, Neuromuscular re-education, Pain management, Patient/family education, Self Care/advanced ADL retraining, Therapeutic Activities, Therapeutic Exercise, UE/LE Coordination activities, UE/LE Strength taining/ROM, Wheelchair propulsion/positioning  SLP Interventions Cognitive remediation/compensation, Speech/Language facilitation, Dysphagia/aspiration precaution training, Internal/external aids, Cueing hierarchy, Environmental controls, Therapeutic Activities, Functional tasks, Patient/family education  TR Interventions    SW/CM Interventions Discharge Planning, Psychosocial Support, Patient/Family Education   Barriers to Discharge MD  Medical stability  Nursing      PT      OT      SLP      SW       Team Discharge Planning: Destination: PT-Home ,OT- Home , SLP-Home Projected Follow-up: PT-Home health PT, 24 hour supervision/assistance, OT-  Home  health OT, SLP-Home Health SLP, 24 hour supervision/assistance Projected Equipment Needs: PT-To be determined, OT- To be determined, SLP-To be determined Equipment Details: PT- , OT-  Patient/family involved in discharge planning: PT- Patient,  OT-Patient, SLP-Patient  MD ELOS: 10-13 days Medical Rehab Prognosis:  Excellent Assessment: The patient has been admitted for CIR therapies with the diagnosis of brain mets/cva d/t metastatic adenocarcinoma. The  team will be addressing functional mobility, strength, stamina, balance, safety, adaptive techniques and equipment, self-care, bowel and bladder mgt, patient and caregiver education, NMR, cognition, communication, swallowing, pain control. Goals have been set at supervision for self-care and mobility and supervision min assist for cognition.   Due to the current state of emergency, patients may not be receiving their 3 hours per day of Medicare-mandated therapy.    Meredith Staggers, MD, FAAPMR      See Team Conference Notes for weekly updates to the plan of care

## 2019-10-11 ENCOUNTER — Inpatient Hospital Stay (HOSPITAL_COMMUNITY): Payer: Medicare Other | Admitting: Occupational Therapy

## 2019-10-11 ENCOUNTER — Inpatient Hospital Stay (HOSPITAL_COMMUNITY): Payer: Medicare Other | Admitting: Physical Therapy

## 2019-10-11 ENCOUNTER — Inpatient Hospital Stay (HOSPITAL_COMMUNITY): Payer: Medicare Other | Admitting: Speech Pathology

## 2019-10-11 LAB — GLUCOSE, CAPILLARY
Glucose-Capillary: 297 mg/dL — ABNORMAL HIGH (ref 70–99)
Glucose-Capillary: 285 mg/dL — ABNORMAL HIGH (ref 70–99)
Glucose-Capillary: 308 mg/dL — ABNORMAL HIGH (ref 70–99)

## 2019-10-11 MED ORDER — METFORMIN HCL 500 MG PO TABS
500.0000 mg | ORAL_TABLET | Freq: Two times a day (BID) | ORAL | Status: DC
  Administered 2019-10-11 – 2019-10-14 (×6): 500 mg via ORAL
  Filled 2019-10-11 (×6): qty 1

## 2019-10-11 MED ORDER — GLIPIZIDE 5 MG PO TABS
5.0000 mg | ORAL_TABLET | Freq: Every day | ORAL | Status: DC
  Administered 2019-10-11 – 2019-10-16 (×5): 5 mg via ORAL
  Filled 2019-10-11 (×6): qty 1

## 2019-10-11 MED ORDER — MAGNESIUM CITRATE PO SOLN
1.0000 | Freq: Once | ORAL | Status: AC
  Administered 2019-10-11: 1 via ORAL
  Filled 2019-10-11: qty 296

## 2019-10-11 NOTE — Progress Notes (Signed)
Speech Language Pathology Daily Session Note  Patient Details  Name: Geoffrey Benjamin MRN: [id number] Date of Birth: 1940-12-22  Today's Date: 10/11/2019 SLP Individual Time: 1000-1055 SLP Individual Time Calculation (min): 55 min  Short Term Goals: Week 1: SLP Short Term Goal 1 (Week 1): Patient will consume current diet with minimal overt s/s of aspiration and supervision level verbal cues for use of swallowing compensatory strategies. SLP Short Term Goal 2 (Week 1): Patient will utilize external memory aids to recall functional information with Mod verbal and visual cues. SLP Short Term Goal 3 (Week 1): Patient will demonstrate functional problem solving with overall Min A verbal cues. SLP Short Term Goal 4 (Week 1): Patient will demonstrate sustained attention to functional tasks for 30 minutes with supervision verbal cues for redirection. SLP Short Term Goal 5 (Week 1): Patient will identify 1 physical and 1 cognitive deficit with Min verbal cues.  Skilled Therapeutic Interventions: Skilled treatment session focused on cognitive goals. SLP facilitated session by providing extra time and Min A verbal cues for attention to RUE during a basic card task. Patient was Mod I for problem solving and Min A verbal cues for alternating attention between functional conversation and basic card task. Patient requested to use the bathroom and ambulated to the bathroom with the RW with supervision verbal cues for attention to RUE. Patient left upright in recliner with alarm on and all needs within reach. Continue with current plan of care.      Pain No/Denies Pain   Therapy/Group: Individual Therapy  Ayden Apodaca 10/11/2019, 12:06 PM

## 2019-10-11 NOTE — Progress Notes (Signed)
Patient slept mostly throughout the night except when he needed to go to the bathroom to void. No signs of distress noted during this time.

## 2019-10-11 NOTE — Progress Notes (Signed)
PHYSICAL MEDICINE & REHABILITATION PROGRESS NOTE   Subjective/Complaints: Had another good night. C/o mild to moderate LBP which is nothing new for him.   ROS: Patient denies fever, rash, sore throat, blurred vision, nausea, vomiting, diarrhea, cough, shortness of breath or chest pain, joint or back pain, headache, or mood change.      Objective:   No results found. No results for input(s): WBC, HGB, HCT, PLT in the last 72 hours. No results for input(s): NA, K, CL, CO2, GLUCOSE, BUN, CREATININE, CALCIUM in the last 72 hours.  Intake/Output Summary (Last 24 hours) at 10/11/2019 1135 Last data filed at 10/10/2019 2030 Gross per 24 hour  Intake 840 ml  Output --  Net 840 ml     Physical Exam: Vital Signs Blood pressure 129/64, pulse 76, temperature 98.3 F (36.8 C), resp. rate 16, height 6' (1.829 m), weight 69.4 kg, SpO2 97 %. Constitutional: No distress . Vital signs reviewed. HEENT: EOMI, oral membranes moist Neck: supple Cardiovascular: RRR without murmur. No JVD    Respiratory: CTA Bilaterally without wheezes or rales. Normal effort    GI: BS +, non-tender, non-distended    Extremities: No clubbing, cyanosis, or edema. Pulses are 2+ Skin: Clean and intact without signs of breakdown Neuro: Alert to name and hospital. Normal language. Fair insight. Follows simple commands.    Appears to be 4/5 UE, BLE grossly 4- to 4/5, apraxia vs HOH Psych: pleasant  Assessment/Plan: 1. 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  Physiatrist is providing close team supervision and 24 hour management of active medical problems listed below.  Physiatrist and rehab team continue to assess barriers to discharge/monitor patient progress toward functional and medical goals  Care Tool:  Bathing    Body parts bathed by  patient: Right arm, Chest, Abdomen, Front perineal area, Right upper leg, Left upper leg, Face   Body parts bathed by helper: Left lower leg, Right lower leg, Buttocks, Left arm     Bathing assist Assist Level: Moderate Assistance - Patient 50 - 74%     Upper Body Dressing/Undressing Upper body dressing   What is the patient wearing?: Pull over shirt    Upper body assist Assist Level: Moderate Assistance - Patient 50 - 74%    Lower Body Dressing/Undressing Lower body dressing      What is the patient wearing?: Pants     Lower body assist Assist for lower body dressing: Moderate Assistance - Patient 50 - 74%     Toileting Toileting Toileting Activity did not occur Landscape architect and hygiene only): N/A (no void or bm)  Toileting assist Assist for toileting: Independent with assistive device Assistive Device Comment: (Front wheel walker)   Transfers Chair/bed transfer  Transfers assist     Chair/bed transfer assist level: Supervision/Verbal cueing     Locomotion Ambulation   Ambulation assist      Assist level: Contact Guard/Touching assist Assistive device: Walker-rolling Max distance: 125 ft   Walk 10 feet activity   Assist     Assist level: Minimal Assistance - Patient > 75% Assistive device: No Device   Walk 50 feet activity   Assist Walk 50 feet with 2 turns activity did not occur: Safety/medical concerns  Assist level: Minimal Assistance - Patient > 75% Assistive device: No Device    Walk 150 feet activity   Assist Walk 150  feet activity did not occur: Safety/medical concerns         Walk 10 feet on uneven surface  activity   Assist Walk 10 feet on uneven surfaces activity did not occur: Safety/medical concerns         Wheelchair     Assist Will patient use wheelchair at discharge?: No   Wheelchair activity did not occur: Safety/medical concerns         Wheelchair 50 feet with 2 turns  activity    Assist    Wheelchair 50 feet with 2 turns activity did not occur: Safety/medical concerns       Wheelchair 150 feet activity     Assist  Wheelchair 150 feet activity did not occur: Safety/medical concerns       Blood pressure 129/64, pulse 76, temperature 98.3 F (36.8 C), resp. rate 16, height 6' (1.829 m), weight 69.4 kg, SpO2 97 %.  Medical Problem List and Plan: 1.  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  Decadron taper as directed             -patient may shower             -ELOS/Goals: 12-16 days, S with PT, OT, and SLP 2.  Antithrombotics: -DVT/anticoagulation: Eliquis 2.5mg  BID             -antiplatelet therapy: aspirin 81 mg daily 3. Pain Management: oxycodone and Tylenol as needed.              Ibuprofen 200mg  BID prn for headache.  -heating pad prn for LB 4. Mood/HS confusion: likely multifactorial given cva/mets/steroids/keppra/mood  -continue Remeron 15 mg nightly   -dc'ed rozerem  - seroquel 12.5mg  nightly at 9pm with improved sleep patterns, decreased confusion  -continue sleep chart             -antipsychotic agents: N/A  -neuropsych assessment would be helpful    5. Neuropsych: This patient is capable of making decisions on his own behalf. 6. Skin/Wound Care: Routine skin checks 7. Fluids/Electrolytes/Nutrition:  Dysphagia 2 diet, thins. Ensure Enlive supplements.  -eating quite well!.    8.  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  Status post stereotactic radiosurgery September 19, 2019 with baseline right side weakness.  Follow-up outpatient Dr. Earlie Server 9.  Seizure disorder.  Keppra 1000 mg every 12 hours. 10.  Hyperlipidemia.  Lipitor 11.  Diabetes mellitus.    Hemoglobin A1c 7.3.   -decadron taper (slow)  -sugars still very high  -pt was on glucophage and glucotrol at home (1000mg  bid and 5mg  daily)   1/8  -begin glucophage 500mg  bid and  Glucotrol 5mg  daily  12.  Tobacco abuse.  NicoDerm patch.  Provide counseling 13.  Diastolic congestive heart failure.  Monitor for any signs of fluid overload  -weights stable thus far Filed Weights   10/07/19 1436 10/09/19 0700  Weight: 69.1 kg 69.4 kg    14.  Hypertension.  Hydrochlorothiazide 6.25 mg daily. 15. Cough/chest congestion: prn Robitussin 16. Constipation:  BM last documented was 1/3. Started senna-docusate BID.   Had small bm 1/7  1/8  Continue dulcolax suppository   -mag citrate today    CODE STATUS: Limited code Permitted: rapid response and ACLS No CPR, intubation, BiPAP, cardioversion    LOS: 4 days A FACE TO FACE EVALUATION WAS PERFORMED  Meredith Staggers 10/11/2019, 11:35 AM

## 2019-10-11 NOTE — Progress Notes (Signed)
Physical Therapy Session Note  Patient Details  Name: Geoffrey Benjamin MRN: [id number] Date of Birth: 1941-09-12  Today's Date: 10/11/2019 PT Individual Time: 1415-1538 PT Individual Time Calculation (min): 83 min   Short Term Goals: Week 1:  PT Short Term Goal 1 (Week 1): STG = LTG due to short ELOS.  Skilled Therapeutic Interventions/Progress Updates:  Pt received in recliner & agreeable to tx. Pt reports unrated back & buttock pain, declines pain medicine & pt assisted with standing/gait to relieve pressure on buttocks. Pt reporting he would like more food with meal trays & therapist assisted pt with ordering additional food on dinner tray while pt performed BLE long arc quads. Pt transfers sit<>stand with supervision and ambulates short ~3 ft to w/c with RW & supervision. In gym, pt ambulates 300 ft with RW & supervision with some improvement noted in R hip/knee flexion during swing phase. Pt completes Berg Balance Test & scores 32/56; educated pt on interpretation of score & current fall risk. Patient demonstrates increased fall risk as noted by score of 32/56 on Berg Balance Scale.  (<36= high risk for falls, close to 100%; 37-45 significant >80%; 46-51 moderate >50%; 52-55 lower >25%). Discussed anticipated d/c date with pt with pt reporting he's ready to go home and becoming agitated over conversation then apologizing. Pt reports need to use restroom & returns to room via w/c dependent assist. Pt ambulates in room/bathroom with RW & close supervision and performs toilet transfer with supervision, managing clothing without assistance. Pt has continent void on toilet and performs hand hygiene standing at sink with supervision with pt initiating using RUE to obtain soap. Pt ambulates room<>break room with RW & supervision with min cuing for attention to R side of environment. Pt transfers sit<>stand from chair without armrests with supervision. Therapist obtains hot chocolate upon pt's request & pt  returns to room. Back in room therapist provides demo & pt performs hip adduction squeezes for strengthening. Pt left in recliner with chair alarm donned, call bell & all needs in reach.   Therapy Documentation Precautions:  Precautions Precautions: Fall Precaution Comments: R inattention Restrictions Weight Bearing Restrictions: No   Balance: Balance Balance Assessed: Yes Standardized Balance Assessment Standardized Balance Assessment: Berg Balance Test Berg Balance Test Sit to Stand: Needs minimal aid to stand or to stabilize(pushes with BLE on EOM to assist with sit>stand) Standing Unsupported: Able to stand 2 minutes with supervision Sitting with Back Unsupported but Feet Supported on Floor or Stool: Able to sit safely and securely 2 minutes Stand to Sit: Sits safely with minimal use of hands Transfers: Able to transfer safely, minor use of hands Standing Unsupported with Eyes Closed: Able to stand 10 seconds with supervision Standing Ubsupported with Feet Together: Able to place feet together independently and stand for 1 minute with supervision From Standing, Reach Forward with Outstretched Arm: Can reach forward >12 cm safely (5") From Standing Position, Pick up Object from Floor: Unable to try/needs assist to keep balance(min assist when pt comes upright 2/2 LOB) From Standing Position, Turn to Look Behind Over each Shoulder: Needs supervision when turning Turn 360 Degrees: Needs close supervision or verbal cueing Standing Unsupported, Alternately Place Feet on Step/Stool: Able to complete 4 steps without aid or supervision Standing Unsupported, One Foot in Front: Able to plae foot ahead of the other independently and hold 30 seconds Standing on One Leg: Unable to try or needs assist to prevent fall(pt attempts to stand on RLE) Total Score: 32  Therapy/Group: Individual Therapy  Geoffrey Benjamin 10/11/2019, 3:49 PM

## 2019-10-11 NOTE — Progress Notes (Signed)
Occupational Therapy Session Note  Patient Details  Name: Geoffrey Benjamin MRN: [id number] Date of Birth: [birth year]  Today's Date: 10/11/2019 OT Individual Time: 8563-1497 OT Individual Time Calculation (min): 58 min   Short Term Goals: Week 1:  OT Short Term Goal 1 (Week 1): Pt will complete toilet transfer with CGA OT Short Term Goal 2 (Week 1): Pt will complete 2/3 toileting tasks OT Short Term Goal 3 (Week 1): Pt will integrate R UE into bathing tasks with min questioning cues  Skilled Therapeutic Interventions/Progress Updates:    Pt greeted seated in recliner and agreeable to OT treatment session focused on self-care retraining. Pt frustrated that his bottom is sore from being in recliner, but his back is sore when he lays in the bed. OT used therapeutic listening to hear pt's concerns and discussed options for improved comfort. Will try different cushion in recliner today. Pt agreeable to shower this morning. Pt with some improved attention to R UE today and placed R hand on RW with only min cues. Pt ambulated during session with RW and CGA/supervision. Bathing completed with focus on forced use of R UE throughout with mod verbal cues to integrate R side. Worked on hemi-dressing techniques with improved recall to dress R side first. Worked on functional use of R UE with blocked practice for buttoning shirt, but pt with poor frustration tolerance and became too frustrated to complete task. Will try to grade activity next session with larger buttons. Worked on opening containers and grasping grooming items. Pt left seated in recliner at end of session with pillow and cushion placed under bottom for comfort. Chair alarm on, call bell in reach, and needs met.   Therapy Documentation Precautions:  Precautions Precautions: Fall Precaution Comments: R inattention Restrictions Weight Bearing Restrictions: No Pain: Pt reports pain from sitting on bottom. Pt did not give number, but OT repositioned  for comfort.   Therapy/Group: Individual Therapy  [name] [name] 10/11/2019, 8:16 AM

## 2019-10-12 ENCOUNTER — Inpatient Hospital Stay (HOSPITAL_COMMUNITY): Payer: Medicare Other | Admitting: Physical Therapy

## 2019-10-12 ENCOUNTER — Inpatient Hospital Stay (HOSPITAL_COMMUNITY): Payer: Medicare Other | Admitting: Occupational Therapy

## 2019-10-12 LAB — GLUCOSE, CAPILLARY
Glucose-Capillary: 220 mg/dL — ABNORMAL HIGH (ref 70–99)
Glucose-Capillary: 140 mg/dL — ABNORMAL HIGH (ref 70–99)
Glucose-Capillary: 281 mg/dL — ABNORMAL HIGH (ref 70–99)
Glucose-Capillary: 377 mg/dL — ABNORMAL HIGH (ref 70–99)

## 2019-10-12 NOTE — Plan of Care (Signed)
  Problem: Consults Goal: RH STROKE PATIENT EDUCATION Description: See Patient Education module for education specifics  Outcome: Progressing Goal: Diabetes Guidelines if Diabetic/Glucose > 140 Description: If diabetic or lab glucose is > 140 mg/dl - Initiate Diabetes/Hyperglycemia Guidelines & Document Interventions  Outcome: Progressing   Problem: RH BOWEL ELIMINATION Goal: RH STG MANAGE BOWEL WITH ASSISTANCE Description: STG Manage Bowel with mod I Assistance. Outcome: Progressing Goal: RH STG MANAGE BOWEL W/MEDICATION W/ASSISTANCE Description: STG Manage Bowel with Medication with mod I Assistance. Outcome: Progressing   Problem: RH SKIN INTEGRITY Goal: RH STG SKIN FREE OF INFECTION/BREAKDOWN Description: Patients skin will remain free from further infection or breakdown with mod assist. Outcome: Progressing Goal: RH STG MAINTAIN SKIN INTEGRITY WITH ASSISTANCE Description: STG Maintain Skin Integrity With mod Assistance. Outcome: Progressing Goal: RH STG ABLE TO PERFORM INCISION/WOUND CARE W/ASSISTANCE Description: STG Able To Perform Incision/Wound Care With mod Assistance. Outcome: Progressing   Problem: RH SAFETY Goal: RH STG ADHERE TO SAFETY PRECAUTIONS W/ASSISTANCE/DEVICE Description: STG Adhere to Safety Precautions With cues Assistance/Device. Outcome: Progressing   Problem: RH KNOWLEDGE DEFICIT Goal: RH STG INCREASE KNOWLEDGE OF DIABETES Description: Patient/caregiver will verbalize understanding of DM management including diet, exercise, medications, monitoring, and follow up appointments with min assist. Outcome: Progressing Goal: RH STG INCREASE KNOWLEDGE OF HYPERTENSION Description: Patient/caregiver will verbalize understanding of HTN management including diet, exercise, medications, monitoring, and follow up appointments with min assist. Outcome: Progressing Goal: RH STG INCREASE KNOWLEDGE OF DYSPHAGIA/FLUID INTAKE Description: Patient/caregiver will  verbalize understanding of dysphagia management including diet, monitoring, and follow up appointments as designated by SLP with min assist. Outcome: Progressing Goal: RH STG INCREASE KNOWLEGDE OF HYPERLIPIDEMIA Description: Patient/caregiver will verbalize understanding of HLD management including diet, exercise, medications, monitoring, and follow up appointments with min assist. Outcome: Progressing

## 2019-10-12 NOTE — Progress Notes (Signed)
[facility] PHYSICAL MEDICINE & REHABILITATION PROGRESS NOTE   Subjective/Complaints: Patient is requesting more food.  States that he eats a lot at home.  Despite this BMI is low-normal  Patient feels ready to go home next week  ROS: Patient denies nausea, vomiting, diarrhea, cough, shortness of breath or chest pain, joint or back pain, headache, or mood change.      Objective:   No results found. No results for input(s): WBC, HGB, HCT, PLT in the last 72 hours. No results for input(s): NA, K, CL, CO2, GLUCOSE, BUN, CREATININE, CALCIUM in the last 72 hours.  Intake/Output Summary (Last 24 hours) at 10/12/2019 1322 Last data filed at 10/12/2019 1251 Gross per 24 hour  Intake 460 ml  Output --  Net 460 ml     Physical Exam: Vital Signs Blood pressure (!) 141/79, pulse 73, temperature 97.8 F (36.6 C), temperature source Oral, resp. rate 20, height 6' (1.829 m), weight 65 kg, SpO2 92 %. Constitutional: No distress . Vital signs reviewed. HEENT: EOMI, oral membranes moist Neck: supple Cardiovascular: RRR without murmur. No JVD    Respiratory: CTA Bilaterally without wheezes or rales. Normal effort    GI: BS +, non-tender, non-distended    Extremities: No clubbing, cyanosis, or edema. Pulses are 2+ Skin: Clean and intact without signs of breakdown Neuro: Alert to name and hospital. Normal language. Fair insight. Follows simple commands.    Appears to be 4-/5 UE, BLE grossly 4/5,  Psych: pleasant  Assessment/Plan: 1. 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  Physiatrist is providing close team supervision and 24 hour management of active medical problems listed below.  Physiatrist and rehab team continue to assess barriers to discharge/monitor patient progress toward functional and medical goals  Care  Tool:  Bathing    Body parts bathed by patient: Right arm, Chest, Abdomen, Front perineal area, Right upper leg, Left upper leg, Face   Body parts bathed by helper: Left lower leg, Right lower leg, Buttocks, Left arm     Bathing assist Assist Level: Moderate Assistance - Patient 50 - 74%     Upper Body Dressing/Undressing Upper body dressing   What is the patient wearing?: Pull over shirt    Upper body assist Assist Level: Moderate Assistance - Patient 50 - 74%    Lower Body Dressing/Undressing Lower body dressing      What is the patient wearing?: Pants     Lower body assist Assist for lower body dressing: Moderate Assistance - Patient 50 - 74%     Toileting Toileting Toileting Activity did not occur Landscape architect and hygiene only): N/A (no void or bm)  Toileting assist Assist for toileting: Independent with assistive device Assistive Device Comment: (Front wheel walker)   Transfers Chair/bed transfer  Transfers assist     Chair/bed transfer assist level: Supervision/Verbal cueing     Locomotion Ambulation   Ambulation assist      Assist level: Supervision/Verbal cueing Assistive device: Walker-rolling Max distance: >150 ft   Walk 10 feet activity   Assist     Assist level: Supervision/Verbal cueing Assistive device: Walker-rolling   Walk 50 feet activity   Assist Walk 50 feet with 2 turns activity did not occur: Safety/medical concerns  Assist level: Supervision/Verbal cueing Assistive device: Walker-rolling    Walk 150 feet activity   Assist Walk 150 feet activity did not  occur: Safety/medical concerns  Assist level: Supervision/Verbal cueing Assistive device: Walker-rolling    Walk 10 feet on uneven surface  activity   Assist Walk 10 feet on uneven surfaces activity did not occur: Safety/medical concerns   Assist level: Supervision/Verbal cueing Assistive device: Aeronautical engineer Will  patient use wheelchair at discharge?: No   Wheelchair activity did not occur: Safety/medical concerns         Wheelchair 50 feet with 2 turns activity    Assist    Wheelchair 50 feet with 2 turns activity did not occur: Safety/medical concerns       Wheelchair 150 feet activity     Assist  Wheelchair 150 feet activity did not occur: Safety/medical concerns       Blood pressure (!) 141/79, pulse 73, temperature 97.8 F (36.6 C), temperature source Oral, resp. rate 20, height 6' (1.829 m), weight 65 kg, SpO2 92 %.  Medical Problem List and Plan: 1.  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  Decadron taper as directed             -patient may shower             -ELOS/Goals: 12-16 days, S with PT, OT, and SLP, patient requesting to go home next week, he will discuss with Dr. Naaman Plummer on Monday 2.  Antithrombotics: -DVT/anticoagulation: Eliquis 2.5mg  BID             -antiplatelet therapy: aspirin 81 mg daily 3. Pain Management: oxycodone and Tylenol as needed.              Ibuprofen 200mg  BID prn for headache.  -heating pad prn for LB 4. Mood/HS confusion: likely multifactorial given cva/mets/steroids/keppra/mood  -continue Remeron 15 mg nightly   -dc'ed rozerem  - seroquel 12.5mg  nightly at 9pm with improved sleep patterns, decreased confusion  -continue sleep chart             -antipsychotic agents: N/A  -neuropsych assessment would be helpful    5. Neuropsych: This patient is capable of making decisions on his own behalf. 6. Skin/Wound Care: Routine skin checks 7. Fluids/Electrolytes/Nutrition:  Dysphagia 2 diet, thins. Ensure Enlive supplements.  -eating quite well!.    8.  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  Status post stereotactic radiosurgery September 19, 2019 with baseline right side weakness.  Follow-up outpatient Dr. Earlie Server 9.   Seizure disorder.  Keppra 1000 mg every 12 hours. 10.  Hyperlipidemia.  Lipitor 11.  Diabetes mellitus.    Hemoglobin A1c 7.3.   -decadron taper (slow)  -sugars still very high  -pt was on glucophage and glucotrol at home (1000mg  bid and 5mg  daily)   1/8 -begin glucophage 500mg  bid and  Glucotrol 5mg  daily  CBG (last 3)  Recent Labs    10/11/19 1647 10/12/19 0622 10/12/19 1146  GLUCAP 308* 220* 140*  Blood sugars are labile, just started Glucophage and Glucotrol, tapering steroids to twice daily starting Monday 12.  Tobacco abuse.  NicoDerm patch.  Provide counseling 13.  Diastolic congestive heart failure.  Monitor for any signs of fluid overload  -weights stable thus far [facility] Weights   10/09/19 0700 10/11/19 1300 10/12/19 0415  Weight: 69.4 kg 66 kg 65 kg    14.  Hypertension.  Hydrochlorothiazide 6.25 mg daily. 15. Cough/chest congestion: prn Robitussin 16. Constipation:  BM last documented was 1/3. Started senna-docusate BID.  Had small bm 1/7  1/8  Continue dulcolax suppository   -mag citrate today    CODE STATUS: Limited code Permitted: rapid response and ACLS No CPR, intubation, BiPAP, cardioversion    LOS: 5 days A FACE TO FACE EVALUATION WAS PERFORMED  Charlett Blake 10/12/2019, 1:22 PM

## 2019-10-12 NOTE — Progress Notes (Signed)
Physical Therapy Session Note  Patient Details  Name: Geoffrey Benjamin MRN: [id number] Date of Birth: [birth year]  Today's Date: 10/12/2019 PT Individual Time: 0962-8366 PT Individual Time Calculation (min): 71 min   Short Term Goals: Week 1:  PT Short Term Goal 1 (Week 1): STG = LTG due to short ELOS.  Skilled Therapeutic Interventions/Progress Updates:  Pt received in recliner & agreeable to tx. Pt asking about seeing doctor today, reporting he'd like more food at meal times & MD & nurse made aware. Pt inquiring about d/c with therapist educating him on recommendation to use RW at home & HHPT f/u. Pt transfers sit<>stand with supervision and ambulates room>ortho gym with RW & supervision with mild R inattention noted & occasionally bumping wall/doorframe on R with RW with pt self correcting & no LOB observed. Pt declined using dynavision reporting he's used it before. Pt completes car transfer at small SUV simulated height with RW & supervision & negotiates ramp & mulch with RW & supervision. Pt negotiates 12 steps (6") with LUE support (pt has wideset B rails at home) with supervision & pt self selecting gait pattern. Pt reports need to use restroom so returned to room & pt ambulates into bathroom with RW & supervision, performing toilet transfer with supervision. Pt has continent void on toilet & performs hand hygiene standing at sink with supervision. Pt declined going back out of room so provided pt with peg board activity to focus on RUE NMR & [facility] but pt with great difficulty grasping & manipulating pegs & becoming visibly aggravated so peg board activity ended and provided pt with foam cubes & instructed pt to place cubes in cup with pt demonstrating more success with activity & task focusing on RUE NMR [place]. Pt then declined further participation in session, despite therapist's suggestions of BUE exercises or ambulating. Pt left in recliner with all needs in reach & chair alarm set.  Pt appears to  have sudden changes in mood throughout session (present yesterday as well) in which he is pleased but then seems very aggravated by progress & pt asked about this. Pt reports he's just trying to get to the next part. Will put in request for neuropsych consult for coping.  Therapy Documentation Precautions:  Precautions Precautions: Fall Precaution Comments: R inattention Restrictions Weight Bearing Restrictions: No   Pain: Pt reports unrated back & buttock pain caused by bed & recliner & pt stood/ambulated to decrease pressure & relieve pain in areas.    Therapy/Group: Individual Therapy  [name] 10/12/2019, 12:02 PM

## 2019-10-12 NOTE — Progress Notes (Signed)
Occupational Therapy Session Note  Patient Details  Name: Geoffrey Benjamin MRN: [id number] Date of Birth: 1941/02/14  Today's Date: 10/12/2019 OT Individual Time: 1345-1415 OT Individual Time Calculation (min): 30 min  and Today's Date: 10/12/2019 OT Missed Time: 30 Minutes Missed Time Reason: Patient unwilling/refused to participate without medical reason  Short Term Goals: Week 1:  OT Short Term Goal 1 (Week 1): Pt will complete toilet transfer with CGA OT Short Term Goal 2 (Week 1): Pt will complete 2/3 toileting tasks OT Short Term Goal 3 (Week 1): Pt will integrate R UE into bathing tasks with min questioning cues  Skilled Therapeutic Interventions/Progress Updates:    Pt greeted sitting in recliner with spouse present and agreeable to OT treatment session. Pt declined bathing/dressing at this time. Pt agreeable to walk to the dayroom. Pt ambulated with RW and close supervision with min verbal cues to avoid objects on R side. Once at the dayroom high-low table, OT offered pt the chair to sit in and rest. Pt became agitated that OT offered him to sit down, so OT told pt he was welcome to maintain standing if he was not tired. Pt still frustrated and pushed walker away and sat down. OT explained that he is an [place] participant in therapy sessions and I give options to patients to help them be active in direction of therapeutic interventions. Pt calmed down and agreeable to work on R hand. OT brought out graded beading activity. Pt then became agitated again and said " I'm not doing that, that's not going to help." OT explained R UE apraxia and coordination deficits and use of  B UE coordination and fine motor activities but he continued to refuse and was very agitated. Pt pushing walker away and started to ambulate back to the room with CGA. Pt returned to wc and OT placed alarm belt with spouse present. Spouse encouraged pt to participate but he refused. OT brought in theraputty and educated on  activities his wife could assist with if he would not work with therapist. Pt worked with theraputty in L hand, but refused to use it in R hand which is the side we need to work on. OT discussed dc plan with pt and wife for dc on Wednesday. Pt and spouse agreeable, although pt still frustrated bc he wants to dc sooner. Educated on importance of being at a supervision level to decrease burden of care on his wife. Pt more agreeable when discussing burden of care. Pt missed 30 minutes of OT treatment session 2/2 refusal. Therapy Documentation Precautions:  Precautions Precautions: Fall Precaution Comments: R inattention Restrictions Weight Bearing Restrictions: No General: General OT Amount of Missed Time: 30 Minutes Pain: Pain Assessment Pain Scale: 0-10 No pain reported at this time   Therapy/Group: Individual Therapy  Valma Cava 10/12/2019, 2:22 PM

## 2019-10-12 NOTE — Progress Notes (Signed)
Social Work Assessment and Plan   Patient Details  Name: Geoffrey Benjamin MRN: [id number] Date of Birth: [birth year]  Today's Date: 10/10/2019  Problem List:  Patient Active Problem List   Diagnosis Date Noted  . Aortic atherosclerosis ([place]) 10/07/2019  . Emphysema of lung ([place]) 10/07/2019  . Metastatic adenocarcinoma ([place]) 10/07/2019  . 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 Status epilepticus ([place]) 10/01/2019  . Adenocarcinoma of right lung, stage 4 ([place]) 09/05/2019  . Goals of care, counseling/discussion 09/05/2019  . Brain metastases ([place]) 09/02/2019  . Right sided weakness 09/01/2019  . Acute systolic congestive heart failure ([place]) 09/19/2018  . CAP (community acquired pneumonia) 09/13/2018  . Hyperlipidemia 09/13/2018  . Volume overload 09/13/2018  . Tobacco use 09/13/2018  . Aortic stenosis 09/13/2018  . Chronic pain 11/10/2015  . Hypertension 11/10/2015  . Tobacco dependence 11/10/2015  . GERD 06/04/2009  . PERSONAL HX COLONIC POLYPS 06/04/2009  . LOSS OF WEIGHT 09/29/2008  . RLQ PAIN 09/29/2008  . Type 2 diabetes mellitus ([place]) 09/22/2008   Past Medical History:  Past Medical History:  Diagnosis Date  . Aortic stenosis 09/13/2018  . CAP (community acquired pneumonia) 09/13/2018  . CHF (congestive heart failure) ([place])   . Difficulty walking   . DM (diabetes mellitus) ([place])   . GERD 06/04/2009   Qualifier: Diagnosis of  By: Henrene Pastor MD, Docia Chuck   . Hyperlipidemia 09/13/2018  . Hypertension   . Loss of weight 09/29/2008   Qualifier: Diagnosis of  By: Bobby Rumpf CMA (AAMA), Patty    . Non-small cell carcinoma of right lung, stage 4 ([place])   . PERSONAL HX COLONIC POLYPS 06/04/2009   Qualifier: Diagnosis of  By: Henrene Pastor MD, [place] PAIN 09/29/2008   Qualifier: Diagnosis of  By: Bobby Rumpf CMA (AAMA), Patty    . Tachycardia   . Tobacco use 09/13/2018  . Volume overload 09/13/2018   Past Surgical History:  Past Surgical History:  Procedure Laterality Date  . CATARACT  EXTRACTION, BILATERAL  08/2016  . DOPPLER ECHOCARDIOGRAPHY     Mitral inflow and tissue doppler consistent with impaired LV relaxation; Trileaflet aortic vlave with moderate aortic valve stenosis, Trivial mitral and tricuspid valve regurgitation.   Social History:  reports that he has been smoking cigarettes. He has been smoking about 1.50 packs per day. He has never used smokeless tobacco. He reports current alcohol use. He reports that he does not use drugs.  Family / Support Systems Marital Status: Married Patient Roles: Spouse, Parent Spouse/Significant Other: wife, Corderius Saraceni @ [phone] Children: daughter, Seleta Rhymes @ [phone] and daughter, Kadeem Hyle @ [phone] Anticipated Caregiver: pt's spouse, Hendryx Ricke Ability/Limitations of Caregiver: supervision only Caregiver Availability: 24/7 Family Dynamics: pt notes wife is supportive, however, supervision only  Social History Preferred language: English Religion:  Cultural Background: NA Read: Yes Write: Yes Employment Status: Retired Public relations account executive Issues: none Guardian/Conservator: none - per MD, pt is capable of making decisions on his own behalf.   Abuse/Neglect Abuse/Neglect Assessment Can Be Completed: Yes Physical Abuse: Denies Verbal Abuse: Denies Sexual Abuse: Denies Exploitation of patient/patient's resources: Denies Self-Neglect: Denies  Emotional Status Pt's affect, behavior and adjustment status: Pt sitting up in w/c and agreeable to interview, however, admits frustration with his situation overall.  PT notes he had been resistant to therapy earlier in the day.  Mood appears frustrated and depressed so will refer for neuropsychology consult to evaluate further. Recent Psychosocial Issues: diagnosis of cancer mets Psychiatric  History: none Substance Abuse History: none  Patient / Family Perceptions, Expectations & Goals Pt/Family understanding of illness & functional  limitations: Pt and wife with good understanding of his metastatic cancer and recent seizures/ need for CIR for debility. Premorbid pt/family roles/activities: Pt was independent overall with wife providing support as needed.   No driving. Anticipated changes in roles/activities/participation: little change anticipated if able to reach supervision goals. Pt/family expectations/goals: supervision as wife unable to provide physical assistance  [employer]: Other (Comment)([facility]) Premorbid Home Care/DME Agencies: None Transportation available at discharge: yes Resource referrals recommended: Neuropsychology  Discharge Planning Living Arrangements: Spouse/significant other Support Systems: Spouse/significant other, Children Type of Residence: Private residence Insurance Resources: [employer] Financial Resources: [place] Referred: No Money Management: Spouse Does the patient have any problems obtaining your medications?: No Home Management: wife Patient/Family Preliminary Plans: pt to return home with wife able to provide supervision Social Work Anticipated Follow Up Needs: HH/OP Expected length of stay: 7-14 days  Clinical Impression Elderly gentleman on CIR with debility following sz and recent brain mets finding.  Wife able to provide only supervision.  Pt's mood is frustrated/ depressed and have referred for neuropsychology for additional support.  Anticipate supervision goals and home with wife.  Will follow for support and d/c planning needs.  Jacia Sickman 10/10/2019, 5:33 PM

## 2019-10-12 NOTE — Progress Notes (Signed)
Pt slept mostly through the night except when needing to go to bathroom to void, pt cough improved after guaifenesin was given.

## 2019-10-12 NOTE — Care Management (Signed)
Inpatient [facility] Individual Statement of Services  Patient Name:  Geoffrey Benjamin  Date:  10/10/2019  Welcome to the [facility].  Our goal is to provide you with an individualized program based on your diagnosis and situation, designed to meet your specific needs.  With this comprehensive rehabilitation program, you will be expected to participate in at least 3 hours of rehabilitation therapies Monday-Friday, with modified therapy programming on the weekends.  Your rehabilitation program will include the following services:  Physical Therapy (PT), Occupational Therapy (OT), Speech Therapy (ST), 24 hour per day rehabilitation nursing, Therapeutic Recreaction (TR), Neuropsychology, Case Management (Social Worker), Rehabilitation Medicine, Nutrition Services and Pharmacy Services  Weekly team conferences will be held on Tuesdays to discuss your progress.  Your Social Worker will talk with you frequently to get your input and to update you on team discussions.  Team conferences with you and your family in attendance may also be held.  Expected length of stay: 7 - 14 days   Overall anticipated outcome: supervision  Depending on your progress and recovery, your program may change. Your Social Worker will coordinate services and will keep you informed of any changes. Your Social Worker's name and contact numbers are listed  below.  The following services may also be recommended but are not provided by the [facility] will be made to provide these services after discharge if needed.  Arrangements include referral to agencies that provide these services.  Your insurance has been verified to be:  Medicare Your primary doctor is:  Jimmye Norman  Pertinent information will be shared with your doctor and your insurance company.  Social  Worker:  [place], [place] or ([phone]   Information discussed with and copy given to patient by: Lennart Pall, 10/10/2019, 5:36 PM

## 2019-10-13 ENCOUNTER — Inpatient Hospital Stay (HOSPITAL_COMMUNITY): Payer: Medicare Other | Admitting: Speech Pathology

## 2019-10-13 LAB — GLUCOSE, CAPILLARY
Glucose-Capillary: 260 mg/dL — ABNORMAL HIGH (ref 70–99)
Glucose-Capillary: 181 mg/dL — ABNORMAL HIGH (ref 70–99)
Glucose-Capillary: 226 mg/dL — ABNORMAL HIGH (ref 70–99)
Glucose-Capillary: 337 mg/dL — ABNORMAL HIGH (ref 70–99)

## 2019-10-13 MED ORDER — CALCIUM CARBONATE ANTACID 500 MG PO CHEW
1.0000 | CHEWABLE_TABLET | Freq: Two times a day (BID) | ORAL | Status: DC | PRN
  Administered 2019-10-13: 200 mg via ORAL
  Filled 2019-10-13: qty 1

## 2019-10-13 MED ORDER — FAMOTIDINE 20 MG PO TABS
20.0000 mg | ORAL_TABLET | Freq: Every day | ORAL | Status: DC
  Administered 2019-10-13 – 2019-10-15 (×3): 20 mg via ORAL
  Filled 2019-10-13 (×3): qty 1

## 2019-10-13 NOTE — Progress Notes (Signed)
PHYSICAL MEDICINE & REHABILITATION PROGRESS NOTE   Subjective/Complaints: Patient has received his double portions.  His wife has questions about Eliquis.  She is concerned that the cost may be up to $500 a month.  She is on warfarin and only spends $11 per month.  Patient feels ready to go home next week  ROS: Patient denies nausea, vomiting, diarrhea, cough, shortness of breath or chest pain, joint or back pain, headache, or mood change.      Objective:   No results found. No results for input(s): WBC, HGB, HCT, PLT in the last 72 hours. No results for input(s): NA, K, CL, CO2, GLUCOSE, BUN, CREATININE, CALCIUM in the last 72 hours.  Intake/Output Summary (Last 24 hours) at 10/13/2019 1448 Last data filed at 10/13/2019 1256 Gross per 24 hour  Intake 897 ml  Output --  Net 897 ml     Physical Exam: Vital Signs Blood pressure 134/60, pulse 78, temperature 97.6 F (36.4 C), resp. rate 17, height 6' (1.829 m), weight 68.6 kg, SpO2 95 %. Constitutional: No distress . Vital signs reviewed. HEENT: EOMI, oral membranes moist Neck: supple Cardiovascular: RRR without murmur. No JVD    Respiratory: CTA Bilaterally without wheezes or rales. Normal effort    GI: BS +, non-tender, non-distended    Extremities: No clubbing, cyanosis, or edema. Pulses are 2+ Skin: Clean and intact without signs of breakdown Neuro: Alert to name and hospital. Normal language. Fair insight. Follows simple commands.    Appears to be 4-/5 UE, BLE grossly 4/5,  Psych: pleasant  Assessment/Plan: 1. 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  Physiatrist is providing close team supervision and 24 hour management of active medical problems listed below.  Physiatrist and rehab team continue to assess barriers to discharge/monitor patient  progress toward functional and medical goals  Care Tool:  Bathing    Body parts bathed by patient: Right arm, Chest, Abdomen, Front perineal area, Right upper leg, Left upper leg, Face   Body parts bathed by helper: Left lower leg, Right lower leg, Buttocks, Left arm     Bathing assist Assist Level: Moderate Assistance - Patient 50 - 74%     Upper Body Dressing/Undressing Upper body dressing   What is the patient wearing?: Pull over shirt    Upper body assist Assist Level: Moderate Assistance - Patient 50 - 74%    Lower Body Dressing/Undressing Lower body dressing      What is the patient wearing?: Pants     Lower body assist Assist for lower body dressing: Moderate Assistance - Patient 50 - 74%     Toileting Toileting Toileting Activity did not occur Landscape architect and hygiene only): N/A (no void or bm)  Toileting assist Assist for toileting: Independent with assistive device Assistive Device Comment: (Front wheel walker)   Transfers Chair/bed transfer  Transfers assist     Chair/bed transfer assist level: Supervision/Verbal cueing     Locomotion Ambulation   Ambulation assist      Assist level: Supervision/Verbal cueing Assistive device: Walker-rolling Max distance: >150 ft   Walk 10 feet activity   Assist     Assist level: Supervision/Verbal cueing Assistive device: Walker-rolling   Walk 50 feet activity   Assist Walk 50 feet with 2 turns activity did not occur: Safety/medical concerns  Assist level: Supervision/Verbal cueing Assistive device: Walker-rolling  Walk 150 feet activity   Assist Walk 150 feet activity did not occur: Safety/medical concerns  Assist level: Supervision/Verbal cueing Assistive device: Walker-rolling    Walk 10 feet on uneven surface  activity   Assist Walk 10 feet on uneven surfaces activity did not occur: Safety/medical concerns   Assist level: Supervision/Verbal cueing Assistive device:  Aeronautical engineer Will patient use wheelchair at discharge?: No   Wheelchair activity did not occur: Safety/medical concerns         Wheelchair 50 feet with 2 turns activity    Assist    Wheelchair 50 feet with 2 turns activity did not occur: Safety/medical concerns       Wheelchair 150 feet activity     Assist  Wheelchair 150 feet activity did not occur: Safety/medical concerns       Blood pressure 134/60, pulse 78, temperature 97.6 F (36.4 C), resp. rate 17, height 6' (1.829 m), weight 68.6 kg, SpO2 95 %.  Medical Problem List and Plan: 1.  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  Decadron taper as directed             -patient may shower             -ELOS/Goals: 12-16 days, S with PT, OT, and SLP, patient requesting to go home next week, he will discuss with Dr. Naaman Plummer on Monday 2.  Antithrombotics: -DVT/anticoagulation: Eliquis 2.5mg  BID, question for CVA versus DVT prophylaxis.  If patient needs to stay on it long-term he would like to be switched to a less expensive medication such as warfarin             -antiplatelet therapy: aspirin 81 mg daily 3. Pain Management: oxycodone and Tylenol as needed.              Ibuprofen 200mg  BID prn for headache.  -heating pad prn for LB 4. Mood/HS confusion: likely multifactorial given cva/mets/steroids/keppra/mood  -continue Remeron 15 mg nightly   -dc'ed rozerem  - seroquel 12.5mg  nightly at 9pm with improved sleep patterns, decreased confusion  -continue sleep chart             -antipsychotic agents: N/A  -neuropsych assessment would be helpful    5. Neuropsych: This patient is capable of making decisions on his own behalf. 6. Skin/Wound Care: Routine skin checks 7. Fluids/Electrolytes/Nutrition:  Dysphagia 2 diet, thins. Ensure Enlive supplements.  -eating quite well!.    8.  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  Status post stereotactic radiosurgery September 19, 2019 with baseline right side weakness.  Follow-up outpatient Dr. Earlie Server 9.  Seizure disorder.  Keppra 1000 mg every 12 hours. 10.  Hyperlipidemia.  Lipitor 11.  Diabetes mellitus.    Hemoglobin A1c 7.3.   -decadron taper (slow)  -sugars still very high  -pt was on glucophage and glucotrol at home (1000mg  bid and 5mg  daily)   1/8 -begin glucophage 500mg  bid and  Glucotrol 5mg  daily  CBG (last 3)  Recent Labs    10/12/19 2120 10/13/19 0609 10/13/19 1132  GLUCAP 377* 260* 181*  Blood sugars are labile, just started Glucophage and Glucotrol, tapering steroids to twice daily starting Monday, CBGs starting to respond 12.  Tobacco abuse.  NicoDerm patch.  Provide counseling 13.  Diastolic congestive heart failure.  Monitor for any signs of fluid overload  -weights stable thus far [employer]   10/11/19  1300 10/12/19 0415 10/13/19 0404  Weight: 66 kg 65 kg 68.6 kg    14.  Hypertension.  Hydrochlorothiazide 6.25 mg daily. 15. Cough/chest congestion: prn Robitussin 16. Constipation:  BM last documented was 1/3. Started senna-docusate BID.   Had small bm 1/7  1/8  Continue dulcolax suppository   -mag citrate today    CODE STATUS: Limited code Permitted: rapid response and ACLS No CPR, intubation, BiPAP, cardioversion    LOS: 6 days A FACE TO FACE EVALUATION WAS PERFORMED  Charlett Blake 10/13/2019, 2:48 PM

## 2019-10-13 NOTE — Plan of Care (Signed)
  Problem: Consults Goal: RH STROKE PATIENT EDUCATION Description: See Patient Education module for education specifics  Outcome: Progressing Goal: Diabetes Guidelines if Diabetic/Glucose > 140 Description: If diabetic or lab glucose is > 140 mg/dl - Initiate Diabetes/Hyperglycemia Guidelines & Document Interventions  Outcome: Progressing   Problem: RH BOWEL ELIMINATION Goal: RH STG MANAGE BOWEL WITH ASSISTANCE Description: STG Manage Bowel with mod I Assistance. Outcome: Progressing Goal: RH STG MANAGE BOWEL W/MEDICATION W/ASSISTANCE Description: STG Manage Bowel with Medication with mod I Assistance. Outcome: Progressing   Problem: RH SKIN INTEGRITY Goal: RH STG SKIN FREE OF INFECTION/BREAKDOWN Description: Patients skin will remain free from further infection or breakdown with mod assist. Outcome: Progressing Goal: RH STG MAINTAIN SKIN INTEGRITY WITH ASSISTANCE Description: STG Maintain Skin Integrity With mod Assistance. Outcome: Progressing Goal: RH STG ABLE TO PERFORM INCISION/WOUND CARE W/ASSISTANCE Description: STG Able To Perform Incision/Wound Care With mod Assistance. Outcome: Progressing   Problem: RH SAFETY Goal: RH STG ADHERE TO SAFETY PRECAUTIONS W/ASSISTANCE/DEVICE Description: STG Adhere to Safety Precautions With cues Assistance/Device. Outcome: Progressing   Problem: RH KNOWLEDGE DEFICIT Goal: RH STG INCREASE KNOWLEDGE OF DIABETES Description: Patient/caregiver will verbalize understanding of DM management including diet, exercise, medications, monitoring, and follow up appointments with min assist. Outcome: Progressing Goal: RH STG INCREASE KNOWLEDGE OF HYPERTENSION Description: Patient/caregiver will verbalize understanding of HTN management including diet, exercise, medications, monitoring, and follow up appointments with min assist. Outcome: Progressing Goal: RH STG INCREASE KNOWLEDGE OF DYSPHAGIA/FLUID INTAKE Description: Patient/caregiver will  verbalize understanding of dysphagia management including diet, monitoring, and follow up appointments as designated by SLP with min assist. Outcome: Progressing Goal: RH STG INCREASE KNOWLEGDE OF HYPERLIPIDEMIA Description: Patient/caregiver will verbalize understanding of HLD management including diet, exercise, medications, monitoring, and follow up appointments with min assist. Outcome: Progressing

## 2019-10-13 NOTE — Progress Notes (Signed)
Speech Language Pathology Daily Session Note  Patient Details  Name: Geoffrey Benjamin MRN: [id number] Date of Birth: 1941-07-04  Today's Date: 10/13/2019 SLP Individual Time: 0935-1030 SLP Individual Time Calculation (min): 55 min  Short Term Goals: Week 1: SLP Short Term Goal 1 (Week 1): Patient will consume current diet with minimal overt s/s of aspiration and supervision level verbal cues for use of swallowing compensatory strategies. SLP Short Term Goal 2 (Week 1): Patient will utilize external memory aids to recall functional information with Mod verbal and visual cues. SLP Short Term Goal 3 (Week 1): Patient will demonstrate functional problem solving with overall Min A verbal cues. SLP Short Term Goal 4 (Week 1): Patient will demonstrate sustained attention to functional tasks for 30 minutes with supervision verbal cues for redirection. SLP Short Term Goal 5 (Week 1): Patient will identify 1 physical and 1 cognitive deficit with Min verbal cues.  Skilled Therapeutic Interventions:  Pt was seen for skilled ST targeting dysphagia goals.  SLP facilitated the session with trial snack of advanced textures to continue working towards diet progression.  Therapist offered dys 3 textures but pt reported that he was "tired of graham crackers."  Pt consumed peanut butter crackers with efficient oral phase and no significant residue remaining in the oral cavity post swallow.  No overt s/s of aspiration were evident with solids or liquids.  Pt's swallowing function appears to be improving and he indicates that he does not like the food he is currently being brought; however, I will defer ultimate decision regarding diet advancement to his primary SLP.  Pt was left in recliner with call bell within reach and chair alarm set.  Continue per current plan of care.    Pain Pain Assessment Pain Scale: 0-10 Pain Score: 0-No pain   Therapy/Group: Individual Therapy  Vanshika Jastrzebski, Selinda Orion 10/13/2019, 3:44 PM

## 2019-10-14 ENCOUNTER — Inpatient Hospital Stay (HOSPITAL_COMMUNITY): Payer: Medicare Other | Admitting: Occupational Therapy

## 2019-10-14 ENCOUNTER — Inpatient Hospital Stay (HOSPITAL_COMMUNITY): Payer: Medicare Other | Admitting: Physical Therapy

## 2019-10-14 ENCOUNTER — Inpatient Hospital Stay (HOSPITAL_COMMUNITY): Payer: Medicare Other | Admitting: Speech Pathology

## 2019-10-14 LAB — GLUCOSE, CAPILLARY
Glucose-Capillary: 221 mg/dL — ABNORMAL HIGH (ref 70–99)
Glucose-Capillary: 219 mg/dL — ABNORMAL HIGH (ref 70–99)
Glucose-Capillary: 249 mg/dL — ABNORMAL HIGH (ref 70–99)
Glucose-Capillary: 290 mg/dL — ABNORMAL HIGH (ref 70–99)

## 2019-10-14 MED ORDER — METFORMIN HCL 500 MG PO TABS
1000.0000 mg | ORAL_TABLET | Freq: Two times a day (BID) | ORAL | Status: DC
  Administered 2019-10-14 – 2019-10-16 (×4): 1000 mg via ORAL
  Filled 2019-10-14 (×4): qty 2

## 2019-10-14 NOTE — Progress Notes (Signed)
Pt slept well throughout night, up to use bathroom several times but calling for staff each time, ambulates with walker with minimal cueing

## 2019-10-14 NOTE — Progress Notes (Signed)
Occupational Therapy Session Note  Patient Details  Name: Geoffrey Benjamin MRN: [id number] Date of Birth: [birth year]  Today's Date: 10/14/2019 OT Individual Time: 3295-1884 OT Individual Time Calculation (min): 59 min   Short Term Goals: Week 1:  OT Short Term Goal 1 (Week 1): Pt will complete toilet transfer with CGA OT Short Term Goal 2 (Week 1): Pt will complete 2/3 toileting tasks OT Short Term Goal 3 (Week 1): Pt will integrate R UE into bathing tasks with min questioning cues    Skilled Therapeutic Interventions/Progress Updates:    Pt greeted in recliner with spouse Geoffrey Benjamin present for family education. Started with solidifying DME needs, with Geoffrey Benjamin stating that pt mostly sponges bathes at home and that the bathroom is right beside bedroom (so no TTB or 3:1 needed). OT recommended to continue with sponge bathing at this time, and if they wanted to pursue showering in the future to first consult home health OT. Geoffrey Benjamin verbalized understanding. Also discussed importance of having pt always ambulate with RW during functional transfers for safety.  Geoffrey Benjamin then assisted pt with bathing sit<stand at the sink during session, providing supervision-Max A. Pt very self limiting with increased bouts of agitation, therefore requesting more assistance from wife at times. Educated Geoffrey Benjamin to buy pull ups for pt at home due to incontinence as pt was adamant that he did not need them. Pt with incontinent episode during tx and impulsively ambulated to the bathroom without RW and steady assist from OT. He continued with B+B void while seated. Spouse assisted him as needed with hygiene and dressing tasks after given instruction. Pt refused to ambulate back into room using device, therefore washed his hands in standing with steady assist before returning to his recliner. Left him with all needs within reach and safety belt fastened. Educated spouse that only staff can assist him with transfers at this time. Geoffrey Benjamin did  practice transferring pt to toilet at start of session using device, when toileting was simulated. Relayed DME needs to SW.    Therapy Documentation Precautions:  Precautions Precautions: Fall Precaution Comments: R inattention Restrictions Weight Bearing Restrictions: No Pain: Pt denied pain during session    ADL: ADL Eating: Set up Grooming: Minimal assistance, Setup Upper Body Bathing: Moderate assistance Lower Body Bathing: Moderate assistance Upper Body Dressing: Moderate assistance Lower Body Dressing: Moderate assistance Toileting: Maximal assistance Toilet Transfer: Moderate assistance Tub/Shower Transfer: Moderate assistance      Therapy/Group: Individual Therapy  Geoffrey Benjamin 10/14/2019, 4:15 PM

## 2019-10-14 NOTE — Progress Notes (Signed)
Speech Language Pathology Daily Session Note  Patient Details  Name: Geoffrey Benjamin MRN: [id number] Date of Birth: 10/26/1940  Today's Date: 10/14/2019 SLP Individual Time: 0865-7846 SLP Individual Time Calculation (min): 45 min  Short Term Goals: Week 1: SLP Short Term Goal 1 (Week 1): Patient will consume current diet with minimal overt s/s of aspiration and supervision level verbal cues for use of swallowing compensatory strategies. SLP Short Term Goal 2 (Week 1): Patient will utilize external memory aids to recall functional information with Mod verbal and visual cues. SLP Short Term Goal 3 (Week 1): Patient will demonstrate functional problem solving with overall Min A verbal cues. SLP Short Term Goal 4 (Week 1): Patient will demonstrate sustained attention to functional tasks for 30 minutes with supervision verbal cues for redirection. SLP Short Term Goal 5 (Week 1): Patient will identify 1 physical and 1 cognitive deficit with Min verbal cues.  Skilled Therapeutic Interventions: Pt was seen for skilled ST targeting dysphagia and cognitive goals. SLP facilitated session with upgraded Dys 3 solid trial and thin liquids. Pt exhibited efficient mastication and oral clearance of Dys 3 solids with 1 verbal cue to attend to right buccal cavity to avoid pocketing. Pt also verbally recalled swallow strategies with Supervision A questions cues. No overt s/sx aspiration observed throughout liquid or solid intake. Recommend upgrade to Dys 3 textures, continue thin liquids, no straws, meds whole in puree. SLP further facilitated session with a novel semi-complex card game targeting cogntive goals. Min A verbal cues for recall of instructions as well as problem solving provided throughout card task. Pt was internally distracted by concern regarding not having a d/c date, therefore Min-Mod A verbal cues were required for redirection to tasks. SLP also reinforced that team is meeting to discuss d/c date at  conference tomorrow and his participation in therapies today is key in progressing toward goals to ultimately work toward d/c home. During session pt expressed need to void, and during that time he required Min A verbal and visual cues for safety awareness (specifically accuracy in use of walker) and sequencing during transfers and ambulation to toilet. Pt left sitting in recliner with seatbelt alarm in place and needs within reach. Continue per current plan of care.         Pain Pain Assessment Pain Scale: 0-10 Pain Score: 0-No pain  Therapy/Group: Individual Therapy  Arbutus Leas 10/14/2019, 12:00 PM

## 2019-10-14 NOTE — Discharge Summary (Signed)
Physician Discharge Summary  Patient ID: PATE AYLWARD MRN: [id number] DOB/AGE: Jun 28, 1941 79 y.o.  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 Additional history obtained from previous radiology examinations: EXAM: CT HEAD WITHOUT CONTRAST TECHNIQUE: Contiguous axial images were obtained from the base of the skull through the vertex without intravenous contrast. COMPARISON:  Brain MRI 09/02/2019 FINDINGS: Brain: The examination is mildly motion degraded. Again demonstrated is extensive vasogenic edema within the left cerebral hemisphere, predominantly within the left frontal, parietal and occipital lobes. Multiple intracranial metastases were better appreciated on recent prior brain MRI 09/02/2019. This includes a dominant left parietal lobe metastasis previously measured at 2.2 cm. There is no definite loss of gray-white differentiation. No evidence of intracranial hemorrhage. As before, there is mass effect with partial effacement of the left lateral ventricle. Rightward midline shift may be minimally increased from prior examination, now measuring 4 mm (previously 3 mm). No extra-axial fluid collection. Cerebral volume is normal for age. Vascular: No hyperdense vessel.  Atherosclerotic calcification. Skull: Normal. Negative for fracture or focal lesion. Sinuses/Orbits: Visualized orbits demonstrate no acute abnormality. Mild ethmoid sinus mucosal thickening. No significant mastoid effusion. IMPRESSION: Mildly motion degraded examination. Extensive vasogenic edema  within the left cerebral hemisphere is similar to prior MRI 07/03/2019. Multiple intracranial metastases were better appreciated on this prior study, including a dominant 2.2 cm left parietal lobe metastasis. Redemonstrated mass effect with partial effacement of the left lateral ventricle. 4 mm rightward midline shift may be minimally increased from the prior exam. No acute intracranial hemorrhage. No definite loss of gray-white differentiation. Electronically Signed   By: Kellie Simmering DO   On: 10/01/2019 12:44   MR BRAIN W WO CONTRAST  Result Date: 10/02/2019 CLINICAL DATA:  Metastatic lung cancer. EXAM: MRI HEAD WITHOUT AND WITH CONTRAST TECHNIQUE: Multiplanar, multiecho pulse sequences of the brain and surrounding structures were obtained without and with intravenous contrast. CONTRAST:  42mL GADAVIST GADOBUTROL 1 MMOL/ML IV SOLN COMPARISON:  09/02/2019 FINDINGS: Multiple sequences are mildly to moderately motion degraded. Brain: A 2.6 cm heterogeneously enhancing left parietal mass has slightly enlarged (previously 2.4 cm when measured in a similar fashion), and there is a small amount of associated chronic blood products. Extensive surrounding vasogenic edema has not significantly changed with similar partial effacement of the left lateral ventricle and 4 mm of rightward midline shift. Three enhancing cerebellar lesions measuring 2-3 mm in size have not significantly changed (series 7, images 5, 12, and 15), nor has a 3 mm right temporoparietal lesion (series 8, image 8). A punctate lesion in the posteroinferior left frontal operculum is not well seen due to motion artifact but has not enlarged (series 9, image 19). No new lesions are identified. There is a new 4 mm focus of restricted diffusion in the white matter of the left frontal lobe at the level of the centrum semiovale without enhancement consistent with an acute infarct. There is no ventriculomegaly or extra-axial fluid collection. Small foci of T2  hyperintensity in the cerebral white matter bilaterally are unchanged and nonspecific but compatible with mild chronic small vessel ischemic disease. There are chronic lacunar infarcts in the pons and cerebellum. Vascular: Major intracranial vascular flow voids  are preserved. Skull and upper cervical spine: No suspicious marrow lesion. Sinuses/Orbits: Bilateral cataract extraction. Minimal bilateral ethmoid air cell mucosal thickening. No significant mastoid fluid. Other: None. IMPRESSION: 1. 4 mm acute infarct in the left centrum semiovale. 2. Slightly increased size of left parietal metastasis with unchanged extensive vasogenic edema and mass effect. 3. Unchanged subcentimeter metastases elsewhere. 4. No new metastases identified within limitations of motion artifact. Electronically Signed   By: Logan Bores M.D.   On: 10/02/2019 14:06   EEG adult  Result Date: 10/01/2019 Lora Havens, MD     10/01/2019  2:24 PM Patient Name: BEAUX WEDEMEYER MRN: [id number] Epilepsy Attending: Lora Havens Referring Physician/Provider: Dr. Amie Portland Date: 10/01/2019 Duration: 25.35 minutes Patient history: 79 year old male with multiple brain mets, largest in left parietal region who presented with status epilepticus.  EEG to evaluate for seizures. Level of alertness: Awake, drowsy/sedated AEDs during EEG study: Keppra, phenytoin, Ativan Technical aspects: This EEG study was done with scalp electrodes positioned according to the 10-20 International system of electrode placement. Electrical activity was acquired at a sampling rate of 500Hz  and reviewed with a high frequency filter of 70Hz  and a low frequency filter of 1Hz . EEG data were recorded continuously and digitally stored. Description: At the beginning of the study, patient was noted to have right facial twitching which stopped at 1332.  Concomitant EEG showed rhythmic 2 to 5 Hz theta-delta slowing in left hemisphere.  EEG also showed continuous 2 to 6 Hz  theta-delta slowing in her left hemisphere admixed with 13 to 15 Hz generalized beta activity.  Hyperventilation and photic stimulation were not performed. 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  During the seizure, patient was noted to have right facial twitching.  Seizure ended at 1332.  EEG also showed evidence of cortical dysfunction in left hemisphere likely secondary to underlying metastatic disease. The excessive beta activity seen in the background is most likely due to the effect of benzodiazepine and is a benign EEG pattern. Dr. Rory Percy was notified and study was transitioned to long-term EEG. Priyanka Barbra Sarks   Overnight EEG with video  Result Date: 10/02/2019 Lora Havens, MD     10/02/2019 11:05 AM Patient Name: NEWEL OIEN MRN: [id number] Epilepsy Attending: Lora Havens Referring Physician/Provider: Dr. Amie Portland Duration:  10/01/2019 1353 to 10/02/2019 0953  Patient history: 79 year old male with multiple brain mets, largest in left parietal region who presented with status epilepticus.  EEG to evaluate for seizures.  Level of alertness: Awake, sleep  AEDs during EEG study: Keppra, phenytoin, Ativan  Technical aspects: This EEG study was done with scalp electrodes positioned according to the 10-20 International system of electrode placement. Electrical activity was acquired at a sampling rate of 500Hz  and reviewed with a high frequency filter of 70Hz  and a low frequency filter of 1Hz . EEG data were recorded continuously and digitally stored.  Description:  During awake state, no clear posterior dominant rhythm was seen.   Sleep was characterized by vertex waves, sleep spindles (12 to 14 Hz), maximal frontocentral. EEG also showed continuous 2 to 6 Hz  theta-delta slowing in her left hemisphere admixed with 13 to 15 Hz generalized beta activity.  Spikes were seen in left frontotemporal region, maximal T3, predominantly during sleep. Abnormality -Spike, left frontotemporal, maximal T3 -Continuous  slow, left hemisphere - Excessive beta, generalized  IMPRESSION: This study showed evidence of epileptogenicity in left frontotemporal region.  EEG also showed evidence of cortical dysfunction in left hemisphere likely secondary to underlying metastatic disease. The excessive beta activity seen in the background is most likely due to the effect of benzodiazepine and is a benign EEG pattern.  No definite seizures were seen during the study. Lora Havens   ECHOCARDIOGRAM COMPLETE  Result Date: 10/03/2019   ECHOCARDIOGRAM REPORT   Patient Name:   JEREMAINE MARAJ Date of Exam: 10/03/2019 Medical Rec #:  [id number]     Height:       73.0 in Accession #:    [id number]    Weight:       161.6 lb Date of Birth:  1941/05/25     BSA:          1.97 m Patient Age:    49 years      BP:           145/69 mmHg Patient Gender: M             HR:           97 bpm. Exam Location:  Inpatient Procedure: 2D Echo Indications:     stroke 434.91  History:         Patient has prior history of Echocardiogram examinations, most                  recent 09/14/2018. CHF; Risk Factors:Hypertension, Diabetes,                  Dyslipidemia and Current Smoker. AS.  Sonographer:     Jannett Celestine RDCS (AE) Referring Phys:  [id number] ASHISH ARORA Diagnosing Phys: Lyman Bishop MD  Sonographer Comments: Suboptimal parasternal window. Image acquisition challenging due to respiratory motion. IMPRESSIONS  1. Technically difficult study. Suboptimal windows. Contrast not administered  2. Left ventricular ejection fraction, by visual estimation, is 55 to 60%. The left ventricle has normal function. There is moderately increased left ventricular hypertrophy.  3. Left ventricular diastolic parameters are  indeterminate.  4. Global right ventricle was not well visualized.The right ventricular size is not well visualized. Right vetricular wall thickness was not assessed.  5. Left atrial size was normal.  6. Right atrial size was not well visualized.  7. Mild to moderate mitral annular calcification.  8. The mitral valve is abnormal. Trivial mitral valve regurgitation.  9. The tricuspid valve is not well visualized. 10. Aortic valve area, by VTI measures 1.18 cm. 11. Aortic valve mean gradient measures 15.5 mmHg. 12. Aortic valve peak gradient measures 29.1 mmHg. 13. The aortic valve was not well visualized. Aortic valve regurgitation is not visualized. Aortic valve leaflets are calcified. Moderate aortic valve stenosis. 14. The pulmonic valve was not well visualized. Pulmonic valve regurgitation is not visualized. 15. The interatrial septum was not well visualized. FINDINGS  Left Ventricle: Left ventricular ejection fraction, by visual estimation, is 55 to 60%. The left ventricle has normal function. The left ventricle is not well visualized. There is moderately increased left ventricular hypertrophy. Left ventricular diastolic parameters are indeterminate. Right Ventricle: The right ventricular size is not well visualized. Right vetricular wall thickness was not assessed. Global RV systolic function is was not well visualized. Left Atrium: Left atrial size was normal in size. Right Atrium: Right atrial size was not well visualized Pericardium: There is no evidence of pericardial effusion. Mitral Valve: The mitral valve is abnormal.  There is mild thickening of the mitral valve leaflet(s). Mild to moderate mitral annular calcification. Trivial mitral valve regurgitation. Tricuspid Valve: The tricuspid valve is not well visualized. Tricuspid valve regurgitation is not demonstrated. Aortic Valve: The aortic valve was not well visualized. Aortic valve regurgitation is not visualized. Moderate aortic stenosis is present.  Aortic valve mean gradient measures 15.5 mmHg. Aortic valve peak gradient measures 29.1 mmHg. Aortic valve area, by  VTI measures 1.18 cm. Pulmonic Valve: The pulmonic valve was not well visualized. Pulmonic valve regurgitation is not visualized. Pulmonic regurgitation is not visualized. Aorta: The aortic root and ascending aorta are structurally normal, with no evidence of dilitation. IAS/Shunts: The interatrial septum was not well visualized.  LEFT VENTRICLE PLAX 2D LVIDd:         5.10 cm LVIDs:         3.40 cm LV PW:         1.20 cm LV IVS:        1.40 cm LVOT diam:     2.30 cm LV SV:         76 ml LV SV Index:   39.43 LVOT Area:     4.15 cm  RIGHT VENTRICLE RV S prime:     12.00 cm/s TAPSE (M-mode): 1.3 cm LEFT ATRIUM             Index LA diam:        3.90 cm 1.98 cm/m LA Vol (A2C):   41.8 ml 21.27 ml/m LA Vol (A4C):   45.1 ml 22.95 ml/m LA Biplane Vol: 44.4 ml 22.59 ml/m  AORTIC VALVE AV Area (Vmax):    1.30 cm AV Area (Vmean):   1.24 cm AV Area (VTI):     1.18 cm AV Vmax:           269.75 cm/s AV Vmean:          183.500 cm/s AV VTI:            0.551 m AV Peak Grad:      29.1 mmHg AV Mean Grad:      15.5 mmHg LVOT Vmax:         84.50 cm/s LVOT Vmean:        54.800 cm/s LVOT VTI:          0.156 m LVOT/AV VTI ratio: 0.28  AORTA Ao Root diam: 3.40 cm  SHUNTS Systemic VTI:  0.16 m Systemic Diam: 2.30 cm  Lyman Bishop MD Electronically signed by Lyman Bishop MD Signature Date/Time: 10/03/2019/12:05:39 PM    Final (Updated)     Labs:  Basic Metabolic Panel: No results for input(s): NA, K, CL, CO2, GLUCOSE, BUN, CREATININE, CALCIUM, MG, PHOS in the last 168 hours.  CBC: No results for input(s): WBC, NEUTROABS, HGB, HCT, MCV, PLT in the last 168 hours.  CBG: Recent Labs  Lab 10/14/19 2117 10/15/19 0610 10/15/19 1210 10/15/19 1622 10/15/19 2154  GLUCAP 290* 249* 260* 328* 211*   Family history.  Father with kidney disease.  Mother with CVA.  Denies any: Or rectal cancer patient could not  recall any history of diabetes  Brief HPI:   DECLYN DELSOL is a 79 y.o. 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 Earlie Server and Dr. Lisbeth Renshaw at the [facility] center.  Patient also with history of type 2 diabetes mellitus, hypertension, heart failure with reduced  ejection fraction of 35% as well as prior alcohol tobacco use.  Per chart review lives with spouse 1 level home he did use a rolling walker at times.  Presented 10/01/2019 with repetitive twitching and jerking-like movements to the right upper extremity and right facial twitching.  Patient did receive 7.5 mg of Versed on route to the ED with noticeable improvement.  Cranial CT scan showed extensive vasogenic edema within the left cerebral hemisphere similar to prior MRI on 07/03/2019.  Multiple intracranial metastasis including a dominant 2.2 cm left parietal lobe.  Redemonstrated mass-effect with partial effacement of the left lateral ventricle.  4 mm rightward midline shift.  No acute intracranial hemorrhage.  EEG showed evidence of focal convulsive status post epilepticus arising from left hemisphere at the beginning of the study as well as cortical dysfunction likely secondary to underlying metastatic disease.  Follow-up MRI showed a 4 mm acute infarction in the left centrum semiovale slightly increased size a left parietal metastasis with unchanged extensive vasogenic edema.  No new mets identified within limitations of motion artifact.  Maintained on Eliquis for CVA prophylaxis.  Echocardiogram with ejection fraction of 65%.  Patient was loaded with Keppra as well as Decadron taper.  Palliative care consulted to establish goals of care.  Oncology service follow-up recommending outpatient to discuss ongoing plan of care.  Patient was admitted for a comprehensive rehab program   Hospital Course:  JANI PLOEGER was admitted to rehab 10/07/2019 for inpatient therapies to consist of PT, ST and OT at least three hours five days a week. Past admission physiatrist, therapy team and rehab RN have worked together to provide customized collaborative inpatient rehab.  In regards to patient's multimedical seizure with metastatic adenocarcinoma of the lung with brain mets as well as left centrum semiovale CVA.  Decadron taper is advised and patient would follow-up outpatient with oncology services.  He remained on Eliquis for CVA hyper coag state no bleeding episodes as well as low-dose aspirin.  Pain managed use of oxycodone and Tylenol as needed patient also receiving ibuprofen twice daily as needed for headaches.  Mood stabilization with the use of Remeron.  Seroquel 12.5 mg nightly at 9 PM improved sleep patterns.  His diet was maintained on a mechanical soft diet with nutritional supplements.  Seizure disorder with Keppra 1000 mg every 12 hours no further seizure activity.  Lipitor ongoing for hyperlipidemia.  History of diabetes mellitus hemoglobin A1c 7.3 monitor closely with Decadron taper.  Patient currently maintained on Glucotrol 5 mg daily and Glucophage 1000 mg twice daily.  Blood pressure soft with HCTZ held and follow-up with primary MD.Pateint was exhibiting no signs of fluid overload.  Noted history of tobacco alcohol use NicoDerm patch patient received counts regards to cessation of illicit drug products alcohol and tobacco.  Bouts of constipation resolved with laxative assistance.  Noted documentation diastolic congestive heart failure exhibited no signs of fluid overload.   Blood pressures were monitored on TID basis and soft and controlled  Diabetes has been monitored with ac/hs CBG checks and SSI was use prn for tighter BS control.  Monitor closely while on Decadron  He/ is continent of bowel and bladder.  He/ has made gains during rehab stay and is attending therapies  He/ will continue  to receive follow up therapies   after discharge  Rehab course: During patient's stay in rehab weekly team conferences were held to monitor patient's progress, set goals and discuss barriers to discharge. At admission, patient required  minimal assist ambulate 25 feet rolling walker, minimal assist stand pivot transfers, mod assist supine to sit.  Moderate assist upper body bathing moderate assist lower body bathing max assist upper body dressing max assist lower body dressing  Physical exam.  Blood pressure 118/58 pulse 79 temperature 98 respirations 18 oxygen saturations 96% room air General.  Alert oriented no apparent distress chronically ill-appearing HEENT.  Head is normocephalic atraumatic. Eyes.  Pupils round and reactive to light no discharge without nystagmus Neck.  Supple nontender no JVD without thyromegaly Cardiovascular regular rate rhythm without any extra sounds or murmur heard Respiratory.  Effort normal no respiratory distress without wheezes Abdomen.  Soft nontender positive bowel sounds without rebound Neurological alert and oriented.  Appears to be 4 out of 5 muscle strength throughout except for right lower extremity with minimal movement.    He/  has had improvement in activity tolerance, balance, postural control as well as ability to compensate for deficits. He/ has had improvement in functional use RUE/LUE  and RLE/LLE as well as improvement in awareness.  Patient ambulating to recliner using rolling walker.  Practice gait with rolling walker stairs car transfers SUV level contact-guard assist to supervision.  Gather his belongings for activities day living and homemaking.  Full family teaching completed plan discharge to home       Disposition: Discharge to home    Diet: Mechanical soft  Special Instructions: No driving smoking or alcohol  Follow-up outpatient with Dr. Earlie Server as well as Dr. Lisbeth Renshaw  Medications at discharge 1.  Eliquis 2.5 mg p.o. twice  daily 2.  Aspirin 81 mg p.o. daily 3.  Lipitor 80 mg p.o. daily 4.  Decadron taper as directed 5.  Pepcid 20 mg p.o. nightly 6.  Glucotrol 5 mg p.o. before breakfast 7.  Advil 200 mg p.o. twice daily as needed headache 8.  Keppra 1000 mg p.o. twice daily 9.  Glucophage 1000 mg p.o. twice daily 10.  Remeron 15 mg p.o. nightly 11.  NicoDerm patch taper as directed 12.  Oxycodone 5 mg every 6 hours as needed moderate pain 13.  Seroquel 12.5 mg p.o. nightly 14.  Senokot S1 tablet p.o. twice daily  Discharge Instructions    Ambulatory referral to Neurology   Complete by: As directed    An appointment is requested in approximately 4 weeks left centrum semiovale CVA   Ambulatory referral to Physical Medicine Rehab   Complete by: As directed    Moderate complexity follow-up 1 to 2 weeks left centrum semiovale CVA      Follow-up Information    Curt Bears, MD Follow up.   Specialty: Oncology Why: Call for appointment Contact information: [place] [address] [phone]        Kyung Rudd, MD Follow up.   Specialty: Radiation Oncology Why: Call for appointment Contact information: [address] [place] [address] [phone]        Izora Ribas, MD Follow up.   Specialty: Physical Medicine and Rehabilitation Why: Office to call for appointment Contact information: [address] [place] [address] [phone]           Signed: Cathlyn Parsons 10/16/2019, 5:18 AM

## 2019-10-14 NOTE — Progress Notes (Signed)
Physical Therapy Session Note  Patient Details  Name: Geoffrey Benjamin MRN: [id number] Date of Birth: [birth year]  Today's Date: 10/14/2019 PT Individual Time: 1420-1440 PT Individual Time Calculation (min): 20 min   Short Term Goals: Week 1:  PT Short Term Goal 1 (Week 1): STG = LTG due to short ELOS.  Skilled Therapeutic Interventions/Progress Updates: Pt presented ambulating to recliner with wife present. Pt stating has just started bouts of diarrhea and does not feel comfortable participating in therapy at this time. As wife was present discussed with pt at wife, pt's current level of function and was is currently required for safety. Explained that recommendation is for ambulation with RW and that wife needs to be mindful of pt's RLE when ambulating as can catch when becomes fatigued. Explained that pt has practiced gait with RW, stairs, and car transfer at SUV level with primary PT and was performed at CGA/supervision level. Also discussed energy conservation and clearing walking paths of any obstacles prior to d/c. Discussed transfers from lower surfaces (furniture) and reviewed that no DME required from PT side. Pt and wife verbalized understanding of all information provided and state that they feel comfortable with anticipated d/c of Wed, no additional questioned from pt or wife. Pt left in recliner with call bell within reach and needs met with wife present.      Therapy Documentation Precautions:  Precautions Precautions: Fall Precaution Comments: R inattention Restrictions Weight Bearing Restrictions: No General: PT Amount of Missed Time (min): 55 Minutes PT Missed Treatment Reason: Toileting;Patient unwilling to participate Vital Signs:   Pain: Pain Assessment Pain Scale: 0-10 Pain Score: 0-No pain    Therapy/Group: Individual Therapy  Rylee Nuzum  Cathy Ropp, PTA  10/14/2019, 3:21 PM

## 2019-10-14 NOTE — Progress Notes (Signed)
[facility] PHYSICAL MEDICINE & REHABILITATION PROGRESS NOTE   Subjective/Complaints: Pt in chair. Anxious to get home and irritated in general (mostly out of wanting to be at home). Michela Pitcher he is resigned to the fact "that my right arm doesn't work"  ROS: Patient denies fever, rash, sore throat, blurred vision, nausea, vomiting, diarrhea, cough, shortness of breath or chest pain, joint or back pain, headache, or mood change.      Objective:   No results found. No results for input(s): WBC, HGB, HCT, PLT in the last 72 hours. No results for input(s): NA, K, CL, CO2, GLUCOSE, BUN, CREATININE, CALCIUM in the last 72 hours.  Intake/Output Summary (Last 24 hours) at 10/14/2019 1030 Last data filed at 10/14/2019 0729 Gross per 24 hour  Intake 582 ml  Output --  Net 582 ml     Physical Exam: Vital Signs Blood pressure 131/65, pulse 78, temperature 98.1 F (36.7 C), resp. rate 17, height 6' (1.829 m), weight 68.6 kg, SpO2 96 %. Constitutional: No distress . Vital signs reviewed. HEENT: EOMI, oral membranes moist Neck: supple Cardiovascular: RRR without murmur. No JVD    Respiratory: CTA Bilaterally without wheezes or rales. Normal effort    GI: BS +, non-tender, non-distended  Extremities: No clubbing, cyanosis, or edema. Pulses are 2+ Skin: Clean and intact without signs of breakdown Neuro: alert to name, place, reason, month/year.  Normal language. Fair insight. Follows simple commands.    Appears to be 4-/5 UE, BLE grossly 4/5,  Psych: pleasant  Assessment/Plan: 1. 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  Physiatrist is providing close team supervision and 24 hour management of active medical problems listed below.  Physiatrist and rehab team continue to assess barriers to discharge/monitor patient progress toward  functional and medical goals  Care Tool:  Bathing    Body parts bathed by patient: Right arm, Chest, Abdomen, Front perineal area, Right upper leg, Left upper leg, Face   Body parts bathed by helper: Left lower leg, Right lower leg, Buttocks, Left arm     Bathing assist Assist Level: Moderate Assistance - Patient 50 - 74%     Upper Body Dressing/Undressing Upper body dressing   What is the patient wearing?: Pull over shirt    Upper body assist Assist Level: Moderate Assistance - Patient 50 - 74%    Lower Body Dressing/Undressing Lower body dressing      What is the patient wearing?: Pants     Lower body assist Assist for lower body dressing: Moderate Assistance - Patient 50 - 74%     Toileting Toileting Toileting Activity did not occur Landscape architect and hygiene only): N/A (no void or bm)  Toileting assist Assist for toileting: Independent with assistive device Assistive Device Comment: (Front wheel walker)   Transfers Chair/bed transfer  Transfers assist     Chair/bed transfer assist level: Supervision/Verbal cueing     Locomotion Ambulation   Ambulation assist      Assist level: Supervision/Verbal cueing Assistive device: Walker-rolling Max distance: >150 ft   Walk 10 feet activity   Assist     Assist level: Supervision/Verbal cueing Assistive device: Walker-rolling   Walk 50 feet activity   Assist Walk 50 feet with 2 turns activity did not occur: Safety/medical concerns  Assist level: Supervision/Verbal cueing Assistive device: Walker-rolling    Walk 150 feet activity   Assist Walk  150 feet activity did not occur: Safety/medical concerns  Assist level: Supervision/Verbal cueing Assistive device: Walker-rolling    Walk 10 feet on uneven surface  activity   Assist Walk 10 feet on uneven surfaces activity did not occur: Safety/medical concerns   Assist level: Supervision/Verbal cueing Assistive device: Horticulturist, commercial Will patient use wheelchair at discharge?: No   Wheelchair activity did not occur: Safety/medical concerns         Wheelchair 50 feet with 2 turns activity    Assist    Wheelchair 50 feet with 2 turns activity did not occur: Safety/medical concerns       Wheelchair 150 feet activity     Assist  Wheelchair 150 feet activity did not occur: Safety/medical concerns       Blood pressure 131/65, pulse 78, temperature 98.1 F (36.7 C), resp. rate 17, height 6' (1.829 m), weight 68.6 kg, SpO2 96 %.  Medical Problem List and Plan: 1.  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  Decadron taper as directed             -patient may shower             -ELOS: 1/13 2.  Antithrombotics: -DVT/anticoagulation: Eliquis 2.5mg  BID, for CVA/"hypercoaguable state"  -have reached to neuro and onc to see if warfarin is an option for him (less cost)             -antiplatelet therapy: aspirin 81 mg daily 3. Pain Management: oxycodone and Tylenol as needed.              Ibuprofen 200mg  BID prn for headache.  -heating pad prn for LB 4. Mood/HS confusion: likely multifactorial given cva/mets/steroids/keppra/mood  -continue Remeron 15 mg nightly   -dc'ed rozerem  - seroquel 12.5mg  nightly at 9pm with improved sleep patterns, decreased confusion  -dc sleep chart             -antipsychotic agents: see above       5. Neuropsych: This patient is capable of making decisions on his own behalf. 6. Skin/Wound Care: Routine skin checks 7. Fluids/Electrolytes/Nutrition:  Dysphagia 3 diet, thins. Ensure Enlive supplements.  -eating quite well  8.  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  Status post stereotactic radiosurgery September 19, 2019 with baseline right side weakness.  Follow-up outpatient Dr. Earlie Server 9.  Seizure disorder.  Keppra 1000 mg every  12 hours. 10.  Hyperlipidemia.  Lipitor 11.  Diabetes mellitus.    Hemoglobin A1c 7.3.   -decadron taper (slow)  -sugars still very high  -pt was on glucophage and glucotrol at home (1000mg  bid and 5mg  daily)   1/8 -begin glucophage 500mg  bid and  Glucotrol 5mg  daily  CBG (last 3)  Recent Labs    10/13/19 1644 10/13/19 2057 10/14/19 0608  GLUCAP 226* 337* 221*  Blood sugars are labile, just started Glucophage and Glucotrol, tapering steroids to twice daily starting today    1/11: increase glucophage to 1000mg  bid 12.  Tobacco abuse.  NicoDerm patch.  Provide counseling 13.  Diastolic congestive heart failure.  Monitor for any signs of fluid overload  -weights stable  Filed Weights   10/11/19 1300 10/12/19 0415 10/13/19 0404  Weight: 66 kg 65 kg 68.6 kg    14.  Hypertension.  Hydrochlorothiazide 6.25 mg daily. 15. Cough/chest congestion: prn Robitussin 16. Constipation:  BM last documented was 1/3. Started  senna-docusate BID.   Had small bm 1/7  1/8 -had large bm    CODE STATUS: Limited code Permitted: rapid response and ACLS No CPR, intubation, BiPAP, cardioversion    LOS: 7 days A FACE TO FACE EVALUATION WAS PERFORMED  Meredith Staggers 10/14/2019, 10:30 AM

## 2019-10-15 ENCOUNTER — Inpatient Hospital Stay (HOSPITAL_COMMUNITY): Payer: Medicare Other | Admitting: Physical Therapy

## 2019-10-15 ENCOUNTER — Telehealth: Payer: Self-pay | Admitting: Radiation Oncology

## 2019-10-15 ENCOUNTER — Inpatient Hospital Stay (HOSPITAL_COMMUNITY): Payer: Medicare Other | Admitting: Occupational Therapy

## 2019-10-15 ENCOUNTER — Inpatient Hospital Stay (HOSPITAL_COMMUNITY): Payer: Medicare Other | Admitting: Speech Pathology

## 2019-10-15 LAB — GLUCOSE, CAPILLARY
Glucose-Capillary: 249 mg/dL — ABNORMAL HIGH (ref 70–99)
Glucose-Capillary: 260 mg/dL — ABNORMAL HIGH (ref 70–99)
Glucose-Capillary: 328 mg/dL — ABNORMAL HIGH (ref 70–99)
Glucose-Capillary: 211 mg/dL — ABNORMAL HIGH (ref 70–99)

## 2019-10-15 MED ORDER — HYDROCHLOROTHIAZIDE 10 MG/ML ORAL SUSPENSION
6.2500 mg | Freq: Every day | ORAL | Status: DC
  Administered 2019-10-16: 6.25 mg via ORAL
  Filled 2019-10-15: qty 1.25

## 2019-10-15 MED ORDER — GLIPIZIDE 5 MG PO TABS: 5.0000 mg | ORAL_TABLET | Freq: Every day | ORAL | 1 refills | Status: DC

## 2019-10-15 MED ORDER — OXYCODONE HCL 5 MG PO TABS: 5.0000 mg | ORAL_TABLET | Freq: Four times a day (QID) | ORAL | 0 refills | Status: DC | PRN

## 2019-10-15 MED ORDER — ATORVASTATIN CALCIUM 80 MG PO TABS: 80.0000 mg | ORAL_TABLET | Freq: Every day | ORAL | 0 refills | Status: DC

## 2019-10-15 MED ORDER — MIRTAZAPINE 15 MG PO TBDP: 15.0000 mg | ORAL_TABLET | Freq: Every day | ORAL | 0 refills | Status: AC

## 2019-10-15 MED ORDER — FAMOTIDINE 20 MG PO TABS: 20.0000 mg | ORAL_TABLET | Freq: Every day | ORAL | 1 refills | Status: AC

## 2019-10-15 MED ORDER — METFORMIN HCL 1000 MG PO TABS: 1000.0000 mg | ORAL_TABLET | Freq: Two times a day (BID) | ORAL | 0 refills | Status: DC

## 2019-10-15 MED ORDER — CALCIUM CARBONATE ANTACID 500 MG PO CHEW: 1.0000 | CHEWABLE_TABLET | Freq: Two times a day (BID) | ORAL | Status: DC | PRN

## 2019-10-15 MED ORDER — HYDROCHLOROTHIAZIDE 10 MG/ML ORAL SUSPENSION: 6.2500 mg | Freq: Every day | ORAL | 0 refills | Status: DC

## 2019-10-15 MED ORDER — DEXAMETHASONE 0.5 MG PO TABS: 0.5000 mg | ORAL_TABLET | Freq: Four times a day (QID) | ORAL | 0 refills | Status: DC

## 2019-10-15 MED ORDER — IBUPROFEN 200 MG PO TABS: 200.0000 mg | ORAL_TABLET | Freq: Two times a day (BID) | ORAL | 0 refills | Status: DC | PRN

## 2019-10-15 MED ORDER — LEVETIRACETAM 1000 MG PO TABS: 1000.0000 mg | ORAL_TABLET | Freq: Two times a day (BID) | ORAL | 1 refills | Status: AC

## 2019-10-15 MED ORDER — HYDROCHLOROTHIAZIDE 12.5 MG PO CAPS: 12.5000 mg | ORAL_CAPSULE | Freq: Every day | ORAL | 11 refills | Status: DC

## 2019-10-15 MED ORDER — NICOTINE 21 MG/24HR TD PT24: MEDICATED_PATCH | TRANSDERMAL | 0 refills | Status: DC

## 2019-10-15 MED ORDER — DEXAMETHASONE 2 MG PO TABS: ORAL_TABLET | ORAL | 0 refills | Status: DC

## 2019-10-15 MED ORDER — APIXABAN 2.5 MG PO TABS: 2.5000 mg | ORAL_TABLET | Freq: Two times a day (BID) | ORAL | 0 refills | Status: DC

## 2019-10-15 MED ORDER — SENNOSIDES-DOCUSATE SODIUM 8.6-50 MG PO TABS: 1.0000 | ORAL_TABLET | Freq: Two times a day (BID) | ORAL | Status: DC

## 2019-10-15 MED ORDER — QUETIAPINE FUMARATE 25 MG PO TABS: 12.0000 mg | ORAL_TABLET | Freq: Every day | ORAL | 0 refills | Status: AC

## 2019-10-15 NOTE — Progress Notes (Signed)
Physical Therapy Discharge Summary  Patient Details  Name: Geoffrey Benjamin MRN: [id number] Date of Birth: 1941/09/21  Today's Date: 10/15/2019   Patient has met 10 of 10 long term goals due to improved activity tolerance, improved balance, improved postural control, increased strength, ability to compensate for deficits, functional use of  right lower extremity, improved attention, improved awareness and improved coordination.  Patient to discharge at an ambulatory level supervision with RW.   Patient's care partner is independent to provide the necessary physical and cognitive assistance at discharge.  Reasons goals not met: n/a  Recommendation:  Patient will benefit from ongoing skilled PT services in home health setting to continue to advance safe functional mobility, address ongoing impairments in R NMR, balance, endurance, progress gait with LRAD, awareness, and minimize fall risk.  Equipment: No equipment provided - pt already has RW at home  Reasons for discharge: treatment goals met and discharge from hospital  Patient/family agrees with progress made and goals achieved: Yes  PT Discharge Precautions/Restrictions Precautions Precautions: Fall Precaution Comments: R inattention Restrictions Weight Bearing Restrictions: No  Vision/Perception  Pt wears glasses at baseline.  Pt with decreased attention to R side of body & environment.  Cognition Overall Cognitive Status: Impaired/Different from baseline Arousal/Alertness: Awake/alert Orientation Level: Oriented X4 Memory: Impaired Memory Impairment: Decreased recall of new information;Decreased short term memory Awareness: Impaired Awareness Impairment: Emergent impairment;Anticipatory impairment Problem Solving: Impaired Problem Solving Impairment: Functional basic;Verbal basic Self Monitoring: Impaired Self Monitoring Impairment: Functional basic Behaviors: Poor frustration tolerance Safety/Judgment: Impaired    Sensation Sensation Proprioception: Impaired by gross assessment(RUE/RLE) Coordination Gross Motor Movements are Fluid and Coordinated: No Fine Motor Movements are Fluid and Coordinated: No Coordination and Movement Description: decreased smoothness and accuracy   Motor  Motor Motor: Abnormal postural alignment and control Motor - Discharge Observations: 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), generalized deconditioning   Mobility Bed Mobility Bed Mobility: Rolling Right;Supine to Sit;Sit to Supine;Rolling Left(flat mat) Rolling Right: Supervision/verbal cueing Rolling Left: Supervision/Verbal cueing Supine to Sit: Supervision/Verbal cueing Sit to Supine: Supervision/Verbal cueing Transfers Transfers: Sit to Stand;Stand to Sit Sit to Stand: Supervision/Verbal cueing Stand to Sit: Supervision/Verbal cueing Stand Pivot Transfers: Supervision/Verbal cueing   Locomotion  Gait Ambulation: Yes Gait Assistance: Supervision/Verbal cueing Gait Distance (Feet): 150 Feet Assistive device: Rolling walker Gait Assistance Details: Verbal cues for precautions/safety Gait Assistance Details: cuing 2/2 R inattention to environment Gait Gait: Yes Gait Pattern: Decreased hip/knee flexion - right;Decreased dorsiflexion - right;Decreased stride length;Decreased step length - right Stairs / Additional Locomotion Stairs: Yes Stairs Assistance: Supervision/Verbal cueing Stair Management Technique: One rail Left Number of Stairs: 12 Height of Stairs: (6" + 3") Wheelchair Mobility Wheelchair Mobility: No   Trunk/Postural Assessment  Thoracic Assessment Thoracic Assessment: Exceptions to WFL(rounded shoulders) Lumbar Assessment Lumbar Assessment: Exceptions to WFL(posterior pelvic tilt) Postural Control Postural Control: Deficits on evaluation Righting Reactions: delayed Protective Responses: delayed   Balance Static Standing Balance Static Standing - Balance Support: During functional  activity Static Standing - Level of Assistance: 5: Stand by assistance  Dynamic Standing Balance Dynamic Standing - Balance Support: During functional activity Dynamic Standing - Level of Assistance: 5: Stand by assistance   Berg Balance Test on 10/11/19 = 32/56 Patient demonstrates increased fall risk as noted by score of 32/56 on Berg Balance Scale.  (<36= high risk for falls, close to 100%; 37-45 significant >80%; 46-51 moderate >50%; 52-55 lower >25%)   Extremity Assessment  Per OT assessment: RUE Assessment RUE Assessment: Exceptions to [place] Body System:  Ortho;Neuro Brunstrum levels for arm and hand: Hand;Arm Brunstrum level for arm: Stage V Relative Independence from [employer] Brunstrum level for hand: Stage VI Isolated joint movements RUE Strength Right Shoulder Flexion: 3+/5 LUE Assessment LUE Assessment: Within Functional Limits  RLE not formally assessed: grossly 3+/5 as no buckling noted in weight bearing LLE Assessment LLE Assessment: Within Functional Limits    Geoffrey Benjamin 10/15/2019, 3:20 PM

## 2019-10-15 NOTE — Telephone Encounter (Signed)
Yes- rebound edema unfortunately despite our best laid plans for his taper :(

## 2019-10-15 NOTE — Patient Care Conference (Signed)
Inpatient RehabilitationTeam Conference and Plan of Care Update Date: 10/15/2019   Time: 10:40 AM    Patient Name: Geoffrey Benjamin      Medical Record Number: [id number]  Date of Birth: 10/26/1940 Sex: Male         Room/Bed: 4W06C/4W06C-01 Payor Info: Payor: MEDICARE / Plan: MEDICARE PART A AND B / Product Type: *No Product type* /    Admit Date/Time:  10/07/2019  2:28 PM  Primary Diagnosis:  Metastatic adenocarcinoma [facility])  Patient Active Problem List   Diagnosis Date Noted  . Aortic atherosclerosis ([place]) 10/07/2019  . Emphysema of lung ([place]) 10/07/2019  . Metastatic adenocarcinoma ([place]) 10/07/2019  . 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 Status epilepticus ([place]) 10/01/2019  . Adenocarcinoma of right lung, stage 4 ([place]) 09/05/2019  . Goals of care, counseling/discussion 09/05/2019  . Brain metastases ([place]) 09/02/2019  . Right sided weakness 09/01/2019  . Acute systolic congestive heart failure ([place]) 09/19/2018  . CAP (community acquired pneumonia) 09/13/2018  . Hyperlipidemia 09/13/2018  . Volume overload 09/13/2018  . Tobacco use 09/13/2018  . Aortic stenosis 09/13/2018  . Chronic pain 11/10/2015  . Hypertension 11/10/2015  . Tobacco dependence 11/10/2015  . GERD 06/04/2009  . PERSONAL HX COLONIC POLYPS 06/04/2009  . LOSS OF WEIGHT 09/29/2008  . RLQ PAIN 09/29/2008  . Type 2 diabetes mellitus ([place]) 09/22/2008    Expected Discharge Date: Expected Discharge Date: 10/16/19  Team Members Present: Physician leading conference: Dr. Alger Simons Social Worker Present: Lennart Pall, LCSW Nurse Present: Frances Maywood, RN Case Manager: Karene Fry, RN PT Present: Lavone Nian, PT OT Present: Cherylynn Ridges, OT SLP Present: Jettie Booze, CF-SLP PPS Coordinator present : Ileana Ladd, Burna Mortimer, SLP     Current Status/Progress Goal Weekly Team Focus  Bowel/Bladder   continent of bowel and bladder BM 1/11  maintain continence  assess q shift and prn    Swallow/Nutrition/ Hydration   upgraded Dys 3 solids, thin liquid, intermittent supervision  Supervision A  tolerance Dys 3/thin, carryover of strategies   ADL's   Supervision/CGA  Supervision  R inattention, dc planning, standing balance/endurance, R UE apraxia, self-care retraining   Mobility   supervision overall with RW  supervision overall with LRAD  R NMR & attention, balance, endurance, gait, transfers, stairs, d/c planning & family ed   Communication             Safety/Cognition/ Behavioral Observations  Min A basic tasks  Min A basic  problem solving, recall, awareness, attention   Pain   0 c/o pain  pain less than 4  assess q shift and prn   Skin   skin tear healing with foam in place, stage1 to coccyx with foam  promote wound healing. no new skin impairments  assess q shift and prn    Rehab Goals Patient on target to meet rehab goals: Yes *See Care Plan and progress notes for long and short-term goals.     Barriers to Discharge  Current Status/Progress Possible Resolutions Date Resolved   Nursing                  PT                    OT                  SLP                SW  Discharge Planning/Teaching Needs:  plan for pt to d/c home with wife who can provide only supervision.  wife here today to attend therapies for education   Team Discussion: RLE use improving, wounds healing, sleeping better.  RN cont B/B, stage 1 coccyx.  OT S/CGA ADLs.  PT S overall with RW.  SLP D3thins with intermittent S, min A for basic tasks.     Revisions to Treatment Plan: N/A     Medical Summary Current Status: improving use of RLE, wounds healing. nutrition improved., cognition near baseline, sleeping better Weekly Focus/Goal: discussions re: long term a/c options, finalizing dc planning  Barriers to Discharge: Behavior;Medical stability       Continued Need for Acute Rehabilitation Level of Care: The patient requires daily medical management by a  physician with specialized training in physical medicine and rehabilitation for the following reasons: Direction of a multidisciplinary physical rehabilitation program to maximize functional independence : Yes Medical management of patient stability for increased activity during participation in an intensive rehabilitation regime.: Yes Analysis of laboratory values and/or radiology reports with any subsequent need for medication adjustment and/or medical intervention. : Yes   I attest that I was present, lead the team conference, and concur with the assessment and plan of the team.   [name] 10/15/2019, 7:54 PM   Team conference was held via web/ teleconference due to [place] - 19

## 2019-10-15 NOTE — Telephone Encounter (Signed)
  Radiation Oncology         [phone] ________________________________  Name: Geoffrey Benjamin MRN: [id number]  Date of Service: 10/15/2019  DOB: 04-May-1941  Post Treatment Telephone Note  Diagnosis:  Stage IV, cT1bN3M1c, NSCLC, adenocarcinoma of the right lung with brain metastases  Interval Since Last Radiation:  4 weeks   09/19/2019 SRS Treatment:  PTV1 Inf Rt Cerebellum 58m 20Gy PTV2 Mid Lt Cerebellum 454m20Gy PTV3 Rt Sup Cerebellum 57m25m0Gy PTV4 Rt Temp/Parietal 5mm42mGy PTV5 Lt Operculum 2mm 68my PTV6 Lt Parietal 22mm 29m  Narrative:  The patient was contacted today for routine follow-up. During treatment he did very well with radiotherapy and did not have significant desquamation. He did have a difficult time in his steroid taper instructions, and developed rebound symptoms and came into the hospital on 10/02/2019 due to seizure like activity. He was found to have persistent edema in the brain and a 4 mm left centrum semiovale infarct. He was transitioned to physiatrist service for rehabilitation and is to go home in the next day or two. We discussed his steroid taper as well with staff prior to rehabilitation. He should finish taking dexamethasone 2 mg every other day on 11/14/19.   Impression/Plan: 1. 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 I couldn't reach the patient or his wife today but left a message on their machine. He needs to be set up to be followed in our brain oncology program. I'll reach out to our navigator as well to coordinate.  2. Seizures. These were felt to be secondary to rebound edema the patient had as a result of taking his steroid taper incorrectly, we will coordinate for him to meet with Dr. VaslowMickeal Skinnerll.  3.   Recent CVA in the left centrum semiovale. The patient has not yet met with Dr. VaslowMickeal Skinner think this would be a good idea to coordinate a visit as well given the recent seizures he had.    [name]

## 2019-10-15 NOTE — Progress Notes (Addendum)
Speech Language Pathology Discharge Summary  Patient Details  Name: Geoffrey Benjamin MRN: [id number] Date of Birth: Sep 12, 1941  Today's Date: 10/15/2019 SLP Individual Time: 1125-1155 SLP Individual Time Calculation (min): 30 min   Skilled Therapeutic Interventions:  Pt was seen for skilled ST targeting education with pt and his wife, as well as cognitive goals. SLP facilitated session with discussion regarding pt's current diet recommendations (Dys 3/thin), including examples of food items in and out of compliance with Dys 3 textures. Emphasized importance of attention to right oral cavity to prevent pocketing. Also discussed impact of pt's current cognitive deficits on daily function, with emphasis on no multitasking, taking on one task at a time and deliberately thinking about sequence of events prior to initiating. Reinforced recommendation for 24/7 supervision and assist; pt's wife verbally acknowledged recommendation and states she will manage finances, medications, cooking, etc. SLP further facilitated session with portions of a basic money management activity from the [employer]. Pt required Min A for problem solving, error awareness, recall/working memory, and Mod A for organization throughout task. Pt exhibited greatest difficulty totaling change without any structure - displaying set amounts of change and calculating/counting change proved to be easier for pt. Pt verbalized good insight into level of difficulty of this task and stated "well this has definitely changed." Pt was left sitting in chair with seat alarm engaged and needs met, wife still present. Continue per current plan of care.     Patient has met 6 of 7 long term goals.  Patient to discharge at overall Supervision;Min level.  Reasons goals not met: cognitive impairments impacting sustained attention that are severe enough to require Min A (goal was Supervision)   Clinical Impression/Discharge Summary:   Pt made functional gains and met  6 out of 7 long term goals this admission. Pt currently requires Min assist for basic cognitive tasks and will require 24/7 supervision and assistance. Pt is consuming Dys 3 diet with thin liquids and is Supervision A for use and recall of swallow strategies. Pt has demonstrated improved basic problem solving, intellectual and emergent awareness, and short term recall. However, given cognitive impairments that have functional impact on basic cognitive function, recommend pt continue to receive skilled [place] services upon discharge. Pt and family education is complete at this time.   Care Partner:  Caregiver Able to Provide Assistance: Yes  Type of Caregiver Assistance: Cognitive  Recommendation:  Home Health SLP;24 hour supervision/assistance  Rationale for SLP Follow Up: Maximize swallowing safety;Maximize cognitive function and independence   Equipment: none   Reasons for discharge: Discharged from hospital   Patient/Family Agrees with Progress Made and Goals Achieved: Yes    Arbutus Leas 10/15/2019, 11:11 AM

## 2019-10-15 NOTE — Plan of Care (Signed)
  Problem: RH Balance Goal: LTG: Patient will maintain dynamic sitting balance (OT) Description: LTG:  Patient will maintain dynamic sitting balance with assistance during activities of daily living (OT) Outcome: Completed/Met Goal: LTG Patient will maintain dynamic standing with ADLs (OT) Description: LTG:  Patient will maintain dynamic standing balance with assist during activities of daily living (OT)  Outcome: Completed/Met   Problem: Sit to Stand Goal: LTG:  Patient will perform sit to stand in prep for activites of daily living with assistance level (OT) Description: LTG:  Patient will perform sit to stand in prep for activites of daily living with assistance level (OT) Outcome: Completed/Met   Problem: RH Eating Goal: LTG Patient will perform eating w/assist, cues/equip (OT) Description: LTG: Patient will perform eating with assist, with/without cues using equipment (OT) Outcome: Completed/Met   Problem: RH Grooming Goal: LTG Patient will perform grooming w/assist,cues/equip (OT) Description: LTG: Patient will perform grooming with assist, with/without cues using equipment (OT) Outcome: Completed/Met   Problem: RH Bathing Goal: LTG Patient will bathe all body parts with assist levels (OT) Description: LTG: Patient will bathe all body parts with assist levels (OT) Outcome: Completed/Met   Problem: RH Dressing Goal: LTG Patient will perform upper body dressing (OT) Description: LTG Patient will perform upper body dressing with assist, with/without cues (OT). Outcome: Completed/Met Goal: LTG Patient will perform lower body dressing w/assist (OT) Description: LTG: Patient will perform lower body dressing with assist, with/without cues in positioning using equipment (OT) Outcome: Completed/Met   Problem: RH Toileting Goal: LTG Patient will perform toileting task (3/3 steps) with assistance level (OT) Description: LTG: Patient will perform toileting task (3/3 steps) with  assistance level (OT)  Outcome: Completed/Met   Problem: RH Functional Use of Upper Extremity Goal: LTG Patient will use RT/LT upper extremity as a (OT) Description: LTG: Patient will use right/left upper extremity as a stabilizer/gross assist/diminished/nondominant/dominant level with assist, with/without cues during functional activity (OT) Outcome: Completed/Met   Problem: RH Toilet Transfers Goal: LTG Patient will perform toilet transfers w/assist (OT) Description: LTG: Patient will perform toilet transfers with assist, with/without cues using equipment (OT) Outcome: Completed/Met   Problem: RH Tub/Shower Transfers Goal: LTG Patient will perform tub/shower transfers w/assist (OT) Description: LTG: Patient will perform tub/shower transfers with assist, with/without cues using equipment (OT) Outcome: Completed/Met   Problem: RH Awareness Goal: LTG: Patient will demonstrate awareness during functional activites type of (OT) Description: LTG: Patient will demonstrate awareness during functional activites type of (OT) Outcome: Completed/Met

## 2019-10-15 NOTE — Progress Notes (Signed)
Occupational Therapy Discharge Summary  Patient Details  Name: Geoffrey Benjamin MRN: [id number] Date of Birth: [birth year]  Today's Date: 10/15/2019 OT Individual Time: 9833-8250 OT Individual Time Calculation (min): 57 min    Pt greeted seated in recliner. Pt frustrated that he was up all night going to the bathroom. Pt reported need to go to the bathroom again. Pt ambulated with RW and supervision with verbal cues for R hand placement. Pt completed clothing management and voided bladder with supervision. Pt declined further bathing/dressing, but wanted to completed grooimng tasks at the sink. Improved functional use of R UE to assist with squeezing toothpaste. Nursing administered pain mediciations. Pt frustrated that all of his pills placed in one cup and  Threw cup after putting them in his moth. Pt then appologized to RN and OT for snapping. OT asked pt if he has always been this quick temptered. He says it is a little worse than normal. Pt ambulated to dayroom with RW and supervision. OT discussed dc plan and pt agreeable. Pt returned to room in similar fashion and left seated in recliner with alarm belt on, call bell in reach, and needs met.    Patient has met 13 of 13 long term goals due to improved activity tolerance, improved balance, postural control, ability to compensate for deficits, functional use of  RIGHT upper and RIGHT lower extremity, improved attention, improved awareness and improved coordination.  Patient to discharge at overall Supervision level.  Patient's care partner is independent to provide the necessary physical and cognitive assistance at discharge.    Reasons goals not met: n/a  Recommendation:  Patient will benefit from ongoing skilled OT services in home health setting to continue to advance functional skills in the area of BADL and functional use of R UE.  Equipment: No equipment provided  Reasons for discharge: treatment goals met and discharge from  hospital  Patient/family agrees with progress made and goals achieved: Yes  OT Discharge Precautions/Restrictions  Precautions Precautions: Fall Precaution Comments: R inattention Restrictions Weight Bearing Restrictions: No Pain Pain Assessment Pain Scale: 0-10 Pain Score: 0-No pain ADL ADL Eating: Modified independent Grooming: Modified independent Upper Body Bathing: Supervision/safety Lower Body Bathing: Supervision/safety Upper Body Dressing: Supervision/safety Lower Body Dressing: Supervision/safety Toileting: Supervision/safety Toilet Transfer: Close supervision Tub/Shower Transfer: Close supervison Perception  Perception: Impaired Inattention/Neglect: Does not attend to right side of body;Does not attend to right visual field(improved since eval) Praxis Praxis: Impaired Cognition Overall Cognitive Status: Impaired/Different from baseline Arousal/Alertness: Awake/alert Orientation Level: Oriented X4 Attention: Sustained Sustained Attention: Impaired Sustained Attention Impairment: Functional basic;Verbal basic Memory: Impaired Memory Impairment: Decreased recall of new information;Decreased short term memory Decreased Short Term Memory: Verbal basic Awareness: Impaired Awareness Impairment: Emergent impairment;Anticipatory impairment Problem Solving: Impaired Problem Solving Impairment: Functional basic;Verbal basic Executive Function: Self Monitoring Self Monitoring: Impaired Self Monitoring Impairment: Functional basic Behaviors: Poor frustration tolerance Safety/Judgment: Impaired Sensation Sensation Light Touch: Appears Intact Proprioception: Impaired by gross assessment(impaire proprioception in R UE) Coordination Gross Motor Movements are Fluid and Coordinated: No Fine Motor Movements are Fluid and Coordinated: No Coordination and Movement Description: decreased smoothness and accuracy  Finger Nose Finger Test: R dysmetria Motor  Motor Motor:  Abnormal postural alignment and control Motor - Discharge Observations: 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), generalized deconditioning Mobility  Bed Mobility Sit to Supine: Supervision/Verbal cueing Transfers Sit to Stand: Supervision/Verbal cueing Stand to Sit: Supervision/Verbal cueing  Trunk/Postural Assessment  Thoracic Assessment Thoracic Assessment: Exceptions to WFL(rounded shoulders) Lumbar Assessment Lumbar Assessment: Exceptions to WFL(posterior  pelvic tilt) Postural Control Postural Control: Deficits on evaluation Righting Reactions: delayed Protective Responses: delayed  Balance Static Sitting Balance Static Sitting - Balance Support: Feet supported Static Sitting - Level of Assistance: 5: Stand by assistance Dynamic Sitting Balance Dynamic Sitting - Balance Support: During functional activity Dynamic Sitting - Level of Assistance: 5: Stand by assistance Static Standing Balance Static Standing - Balance Support: During functional activity Static Standing - Level of Assistance: 5: Stand by assistance;4: Min assist Dynamic Standing Balance Dynamic Standing - Balance Support: During functional activity Dynamic Standing - Level of Assistance: 5: Stand by assistance Extremity/Trunk Assessment RUE Assessment RUE Assessment: Exceptions to [facility] RUE Body System: Ortho;Neuro Brunstrum levels for arm and hand: Hand;Arm Brunstrum level for arm: Stage V Relative Independence from Synergy Brunstrum level for hand: Stage VI Isolated joint movements RUE Strength Right Shoulder Flexion: 3+/5 LUE Assessment LUE Assessment: Within Functional Limits   [name] Brainard Highfill 10/15/2019, 1:00 PM

## 2019-10-15 NOTE — Progress Notes (Signed)
[place] PHYSICAL MEDICINE & REHABILITATION PROGRESS NOTE   Subjective/Complaints: In better spirits this morning now that he knows hes going home tomorrow  ROS: Patient denies fever, rash, sore throat, blurred vision, nausea, vomiting, diarrhea, cough, shortness of breath or chest pain, joint or back pain, headache, or mood change.       Objective:   No results found. No results for input(s): WBC, HGB, HCT, PLT in the last 72 hours. No results for input(s): NA, K, CL, CO2, GLUCOSE, BUN, CREATININE, CALCIUM in the last 72 hours. No intake or output data in the 24 hours ending 10/15/19 1103   Physical Exam: Vital Signs Blood pressure (!) 96/51, pulse 80, temperature 98 F (36.7 C), resp. rate 16, height 6' (1.829 m), weight 68.3 kg, SpO2 96 %. Constitutional: No distress . Vital signs reviewed. HEENT: EOMI, oral membranes moist Neck: supple Cardiovascular: RRR without murmur. No JVD    Respiratory: CTA Bilaterally without wheezes or rales. Normal effort    GI: BS +, non-tender, non-distended   Extremities: No clubbing, cyanosis, or edema. Pulses are 2+ Skin: Clean and intact without signs of breakdown Neuro: alert to name, place, reason, month/year.  Normal language. Fair insight. Follows simple commands.    Appears to be 4-/5 UE, BLE grossly 4/5--stable today  Psych: cooperative and in better spirits today  Assessment/Plan: 1. 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  Physiatrist is providing close team supervision and 24 hour management of active medical problems listed below.  Physiatrist and rehab team continue to assess barriers to discharge/monitor patient progress toward functional and medical goals  Care Tool:  Bathing    Body parts bathed by patient: Right arm, Chest, Abdomen, Front perineal area,  Right upper leg, Left upper leg, Face, Buttocks, Right lower leg, Left lower leg   Body parts bathed by helper: Left lower leg, Right lower leg, Buttocks, Left arm Body parts n/a: Left arm   Bathing assist Assist Level: Supervision/Verbal cueing     Upper Body Dressing/Undressing Upper body dressing   What is the patient wearing?: Button up shirt    Upper body assist Assist Level: Maximal Assistance - Patient 25 - 49%    Lower Body Dressing/Undressing Lower body dressing      What is the patient wearing?: Pants, Incontinence brief     Lower body assist Assist for lower body dressing: Minimal Assistance - Patient > 75%     Toileting Toileting Toileting Activity did not occur (Clothing management and hygiene only): N/A (no void or bm)  Toileting assist Assist for toileting: Moderate Assistance - Patient 50 - 74% Assistive Device Comment: (Front wheel walker)   Transfers Chair/bed transfer  Transfers assist     Chair/bed transfer assist level: Supervision/Verbal cueing     Locomotion Ambulation   Ambulation assist      Assist level: Supervision/Verbal cueing Assistive device: Walker-rolling Max distance: 150 ft   Walk 10 feet activity   Assist     Assist level: Supervision/Verbal cueing Assistive device: Walker-rolling   Walk 50 feet activity   Assist Walk 50 feet with 2 turns activity did not occur: Safety/medical concerns  Assist level: Supervision/Verbal cueing Assistive device: Walker-rolling    Walk 150 feet activity   Assist Walk 150 feet activity did not occur: Safety/medical concerns  Assist level: Supervision/Verbal cueing Assistive device: Walker-rolling    Walk 10  feet on uneven surface  activity   Assist Walk 10 feet on uneven surfaces activity did not occur: Safety/medical concerns   Assist level: Supervision/Verbal cueing Assistive device: Aeronautical engineer Will patient use wheelchair at  discharge?: No   Wheelchair activity did not occur: Safety/medical concerns         Wheelchair 50 feet with 2 turns activity    Assist    Wheelchair 50 feet with 2 turns activity did not occur: Safety/medical concerns       Wheelchair 150 feet activity     Assist  Wheelchair 150 feet activity did not occur: Safety/medical concerns       Blood pressure (!) 96/51, pulse 80, temperature 98 F (36.7 C), resp. rate 16, height 6' (1.829 m), weight 68.3 kg, SpO2 96 %.  Medical Problem List and Plan: 1.  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  Decadron taper as directed             --Continue CIR therapies including PT, OT, and SLP              -ELOS: 1/13  -spoke with wife today re: medical considerations  -Patient to see Dr. Ranell Patrick in the office for transitional care encounter in 1-2 weeks.  2.  Antithrombotics: -DVT/anticoagulation: Eliquis 2.5mg  BID, for CVA/"hypercoaguable state"  -d/w Dr. Julien Nordmann who would prefer eliquis over warfarin. Will work on patient assistance. Pt/wife can discuss further with oncology as outpt             -antiplatelet therapy: aspirin 81 mg daily 3. Pain Management: oxycodone and Tylenol as needed.              Ibuprofen 200mg  BID prn for headache.  -heating pad prn for LB 4. Mood/HS confusion: likely multifactorial given cva/mets/steroids/keppra/mood  -continue Remeron 15 mg nightly   -dc'ed rozerem  - seroquel 12.5mg  nightly at 9pm with improved sleep patterns, decreased confusion  -dc sleep chart             -antipsychotic agents: see above       5. Neuropsych: This patient is capable of making decisions on his own behalf. 6. Skin/Wound Care: Routine skin checks 7. Fluids/Electrolytes/Nutrition:  Dysphagia 3 diet, thins. Ensure Enlive supplements.  -eating quite well  8.  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   Status post stereotactic radiosurgery September 19, 2019 with baseline right side weakness.  Follow-up outpatient Dr. Julien Nordmann 9.  Seizure disorder.  Keppra 1000 mg every 12 hours. 10.  Hyperlipidemia.  Lipitor  11.  Diabetes mellitus.    Hemoglobin A1c 7.3.   -decadron taper (slow)  -sugars still very high  -pt was on glucophage and glucotrol at home (1000mg  bid and 5mg  daily)   1/8 -begin glucophage 500mg  bid and  Glucotrol 5mg  daily  CBG (last 3)  Recent Labs    10/14/19 1714 10/14/19 2117 10/15/19 0610  GLUCAP 249* 290* 249*       1/11: increased glucophage to 1000mg  bid   1/12: monitor for further pattern today. More potential changes as outpt if sugars don't drop further with decadron taper 12.  Tobacco abuse.  NicoDerm patch.  Provide counseling 13.  Diastolic congestive heart failure.  Monitor for any signs of fluid overload  -weights stable 1/12 Filed Weights   10/12/19 0415 10/13/19 0404 10/15/19 0452  Weight: 65 kg 68.6 kg 68.3 kg  14.  Hypertension.  Hydrochlorothiazide 6.25 mg daily. 15. Cough/chest congestion: prn Robitussin 16. Constipation:  BM last documented was 1/3. Started senna-docusate BID.   Had small bm 1/7  1/11 -had large bm    CODE STATUS: Limited code Permitted: rapid response and ACLS No CPR, intubation, BiPAP, cardioversion    LOS: 8 days A FACE TO FACE EVALUATION WAS PERFORMED  Meredith Staggers 10/15/2019, 11:03 AM

## 2019-10-15 NOTE — Progress Notes (Addendum)
Physical Therapy Session Note  Patient Details  Name: Geoffrey Benjamin MRN: [id number] Date of Birth: 05-29-1941  Today's Date: 10/15/2019 PT Individual Time: 3790-2409 and 7353-2992 PT Individual Time Calculation (min): 28 min and 38 min  Short Term Goals: Week 1:  PT Short Term Goal 1 (Week 1): STG = LTG due to short ELOS.  Skilled Therapeutic Interventions/Progress Updates:  Treatment 1: Pt received in recliner with wife [name]) present for session. No c/o pain reported. Educated pt & wife on need to remove throw rugs to increase pt safety in the home, HHPT f/u, need to use RW at this time & progress to cane with guidance from [place], and pt's R inattention. Pt completes sit<>stand transfers with supervision & pt ambulates around unit with RW & supervision. Pt completes car transfer at SUV simulated height with RW & negotiates 4 steps with 1 rail and supervision. Therapist provides instruction/demo for wife's positioning in relation to pt during gait & stair negotiation. Pt & family deny questions/concerns re: d/c home tomorrow & pt very eager to d/c. Pt left in chair with chair pad alarm donned, call bell in reach, wife present in room.  Treatment 2: Pt received in recliner with wife present but not attending session. No c/o pain. Pt transfers sit<>stand with supervision & ambulates room<>ortho gym with RW & supervision. Pt declines using dynavision then using nu-step. Pt completes bed mobility on mat table with supervision. Provided pt with cup of coffee upon request & pt wishing to return to room. Once back in recliner therapist encouraged pt to use RUE to operate lever but pt declined. Pt eager to go home and "through" with therapies but thanking therapists for all they've done. Discussed need for pt to use RUE as much as possible to increase strength & function and to take prescription medications as MD prescribes them. Pt left in recliner with chair alarm donned, all needs in reach, wife present  in room. Addendum: Pt declined practicing floor transfer.  Therapy Documentation Precautions:  Precautions Precautions: Fall Precaution Comments: R inattention Restrictions Weight Bearing Restrictions: No   General: PT Amount of Missed Time (min): 22 Minutes PT Missed Treatment Reason: Patient unwilling to participate     Therapy/Group: Individual Therapy  [name] 10/15/2019, 1:52 PM

## 2019-10-15 NOTE — Progress Notes (Signed)
Pt irritable at beginning of shift over having a BM, is wanting to go home. Otherwise slept well during night

## 2019-10-16 DIAGNOSIS — L8990 Pressure ulcer of unspecified site, unspecified stage: Secondary | ICD-10-CM | POA: Insufficient documentation

## 2019-10-16 LAB — GLUCOSE, CAPILLARY: Glucose-Capillary: 253 mg/dL — ABNORMAL HIGH (ref 70–99)

## 2019-10-16 NOTE — Progress Notes (Signed)
Patient and spouse received discharge instructions from Marlowe Shores, PA-C with verbal understanding. Patient discharged to home with spouse and belongings.

## 2019-10-16 NOTE — Progress Notes (Signed)
Social Work Discharge Note   The overall goal for the admission was met for:   Discharge location: Yes - home with wife who can provide supervision only.  Length of Stay: Yes - 9 days  Discharge activity level: Yes - supervision overall  Home/community participation: Yes  Services provided included: MD, RD, PT, OT, SLP, RN, Pharmacy, Neuropsych and SW  Financial Services: Medicare  Follow-up services arranged: Home Health: PT, OT via [place], DME: NA and Patient/Family has no preference for HH/DME agencies  Comments (or additional information):   Contact person, wife, Dareld Mcauliffe @ [phone]   Patient/Family verbalized understanding of follow-up arrangements: Yes  Individual responsible for coordination of the follow-up plan: pt  Confirmed correct DME delivered:  NA    Finesse Fielder

## 2019-10-18 ENCOUNTER — Telehealth: Payer: Self-pay | Admitting: Registered Nurse

## 2019-10-18 ENCOUNTER — Telehealth: Payer: Self-pay | Admitting: *Deleted

## 2019-10-18 NOTE — Telephone Encounter (Signed)
Gerrilyn PT [facility] called to let us know that she has not been able to see the patient yet as the wife has not let her come out because he is "getting settled" after discharge from hospital. They have her number to call when they will allow her to come out.

## 2019-10-18 NOTE — Telephone Encounter (Signed)
Placed a call to Mrs. Hilmes cell and home phone for TC call for Mr. Raver, no answer. Left message to return the call

## 2019-10-21 ENCOUNTER — Ambulatory Visit: Payer: Self-pay | Admitting: Radiation Oncology

## 2019-10-21 ENCOUNTER — Telehealth: Payer: Self-pay | Admitting: Internal Medicine

## 2019-10-21 ENCOUNTER — Inpatient Hospital Stay: Payer: Medicare Other | Admitting: Internal Medicine

## 2019-10-21 NOTE — Telephone Encounter (Signed)
Returned patient's phone call regarding rescheduling 01/18 appointment, per patients request appointment has moved to 01/27.

## 2019-10-29 ENCOUNTER — Encounter: Payer: Medicare Other | Admitting: Physical Medicine and Rehabilitation

## 2019-10-30 ENCOUNTER — Telehealth: Payer: Self-pay | Admitting: *Deleted

## 2019-10-30 ENCOUNTER — Inpatient Hospital Stay: Payer: Medicare Other | Attending: Internal Medicine | Admitting: Internal Medicine

## 2019-10-30 ENCOUNTER — Encounter: Payer: Self-pay | Admitting: *Deleted

## 2019-10-30 ENCOUNTER — Encounter: Payer: Self-pay | Admitting: Internal Medicine

## 2019-10-30 ENCOUNTER — Other Ambulatory Visit: Payer: Self-pay

## 2019-10-30 VITALS — BP 127/70 | HR 111 | Temp 98.3°F | Resp 18 | Ht 73.0 in | Wt 150.6 lb

## 2019-10-30 DIAGNOSIS — C3492 Malignant neoplasm of unspecified part of left bronchus or lung: Secondary | ICD-10-CM | POA: Insufficient documentation

## 2019-10-30 DIAGNOSIS — C770 Secondary and unspecified malignant neoplasm of lymph nodes of head, face and neck: Secondary | ICD-10-CM | POA: Diagnosis not present

## 2019-10-30 DIAGNOSIS — I639 Cerebral infarction, unspecified: Secondary | ICD-10-CM | POA: Diagnosis not present

## 2019-10-30 DIAGNOSIS — R531 Weakness: Secondary | ICD-10-CM | POA: Diagnosis not present

## 2019-10-30 DIAGNOSIS — R5382 Chronic fatigue, unspecified: Secondary | ICD-10-CM

## 2019-10-30 DIAGNOSIS — C7931 Secondary malignant neoplasm of brain: Secondary | ICD-10-CM

## 2019-10-30 DIAGNOSIS — R0609 Other forms of dyspnea: Secondary | ICD-10-CM | POA: Diagnosis not present

## 2019-10-30 DIAGNOSIS — R5383 Other fatigue: Secondary | ICD-10-CM | POA: Insufficient documentation

## 2019-10-30 DIAGNOSIS — C3491 Malignant neoplasm of unspecified part of right bronchus or lung: Secondary | ICD-10-CM

## 2019-10-30 DIAGNOSIS — Z5112 Encounter for antineoplastic immunotherapy: Secondary | ICD-10-CM | POA: Insufficient documentation

## 2019-10-30 DIAGNOSIS — C799 Secondary malignant neoplasm of unspecified site: Secondary | ICD-10-CM

## 2019-10-30 DIAGNOSIS — F17200 Nicotine dependence, unspecified, uncomplicated: Secondary | ICD-10-CM

## 2019-10-30 DIAGNOSIS — Z7189 Other specified counseling: Secondary | ICD-10-CM

## 2019-10-30 MED ORDER — PROCHLORPERAZINE MALEATE 10 MG PO TABS: 10.0000 mg | ORAL_TABLET | Freq: Four times a day (QID) | ORAL | 0 refills | Status: AC | PRN

## 2019-10-30 NOTE — Patient Instructions (Signed)
Steps to Quit Smoking Smoking tobacco is the leading cause of preventable death. It can affect almost every organ in the body. Smoking puts you and people around you at risk for many serious, long-lasting (chronic) diseases. Quitting smoking can be hard, but it is one of the best things that you can do for your health. It is never too late to quit. How do I get ready to quit? When you decide to quit smoking, make a plan to help you succeed. Before you quit:  Pick a date to quit. Set a date within the next 2 weeks to give you time to prepare.  Write down the reasons why you are quitting. Keep this list in places where you will see it often.  Tell your family, friends, and co-workers that you are quitting. Their support is important.  Talk with your doctor about the choices that may help you quit.  Find out if your health insurance will pay for these treatments.  Know the people, places, things, and activities that make you want to smoke (triggers). Avoid them. What first steps can I take to quit smoking?  Throw away all cigarettes at home, at work, and in your car.  Throw away the things that you use when you smoke, such as ashtrays and lighters.  Clean your car. Make sure to empty the ashtray.  Clean your home, including curtains and carpets. What can I do to help me quit smoking? Talk with your doctor about taking medicines and seeing a counselor at the same time. You are more likely to succeed when you do both.  If you are pregnant or breastfeeding, talk with your doctor about counseling or other ways to quit smoking. Do not take medicine to help you quit smoking unless your doctor tells you to do so. To quit smoking: Quit right away  Quit smoking totally, instead of slowly cutting back on how much you smoke over a period of time.  Go to counseling. You are more likely to quit if you go to counseling sessions regularly. Take medicine You may take medicines to help you quit. Some  medicines need a prescription, and some you can buy over-the-counter. Some medicines may contain a drug called nicotine to replace the nicotine in cigarettes. Medicines may:  Help you to stop having the desire to smoke (cravings).  Help to stop the problems that come when you stop smoking (withdrawal symptoms). Your doctor may ask you to use:  Nicotine patches, gum, or lozenges.  Nicotine inhalers or sprays.  Non-nicotine medicine that is taken by mouth. Find resources Find resources and other ways to help you quit smoking and remain smoke-free after you quit. These resources are most helpful when you use them often. They include:  Online chats with a counselor.  Phone quitlines.  Printed self-help materials.  Support groups or group counseling.  Text messaging programs.  Mobile phone apps. Use apps on your mobile phone or tablet that can help you stick to your quit plan. There are many free apps for mobile phones and tablets as well as websites. Examples include Quit Guide from the CDC and smokefree.gov  What things can I do to make it easier to quit?   Talk to your family and friends. Ask them to support and encourage you.  Call a phone quitline (1-800-QUIT-NOW), reach out to support groups, or work with a counselor.  Ask people who smoke to not smoke around you.  Avoid places that make you want to smoke,   such as: ? Bars. ? Parties. ? Smoke-break areas at work.  Spend time with people who do not smoke.  Lower the stress in your life. Stress can make you want to smoke. Try these things to help your stress: ? Getting regular exercise. ? Doing deep-breathing exercises. ? Doing yoga. ? Meditating. ? Doing a body scan. To do this, close your eyes, focus on one area of your body at a time from head to toe. Notice which parts of your body are tense. Try to relax the muscles in those areas. How will I feel when I quit smoking? Day 1 to 3 weeks Within the first 24 hours,  you may start to have some problems that come from quitting tobacco. These problems are very bad 2-3 days after you quit, but they do not often last for more than 2-3 weeks. You may get these symptoms:  Mood swings.  Feeling restless, nervous, angry, or annoyed.  Trouble concentrating.  Dizziness.  Strong desire for high-sugar foods and nicotine.  Weight gain.  Trouble pooping (constipation).  Feeling like you may vomit (nausea).  Coughing or a sore throat.  Changes in how the medicines that you take for other issues work in your body.  Depression.  Trouble sleeping (insomnia). Week 3 and afterward After the first 2-3 weeks of quitting, you may start to notice more positive results, such as:  Better sense of smell and taste.  Less coughing and sore throat.  Slower heart rate.  Lower blood pressure.  Clearer skin.  Better breathing.  Fewer sick days. Quitting smoking can be hard. Do not give up if you fail the first time. Some people need to try a few times before they succeed. Do your best to stick to your quit plan, and talk with your doctor if you have any questions or concerns. Summary  Smoking tobacco is the leading cause of preventable death. Quitting smoking can be hard, but it is one of the best things that you can do for your health.  When you decide to quit smoking, make a plan to help you succeed.  Quit smoking right away, not slowly over a period of time.  When you start quitting, seek help from your doctor, family, or friends. This information is not intended to replace advice given to you by your health care provider. Make sure you discuss any questions you have with your health care provider. Document Revised: 06/14/2019 Document Reviewed: 12/08/2018 [employer] Patient Education  2020 [employer]  

## 2019-10-30 NOTE — Progress Notes (Signed)
START ON PATHWAY REGIMEN - Non-Small Cell Lung     A cycle is 21 days:     Pembrolizumab   **Always confirm dose/schedule in your pharmacy ordering system**  Patient Characteristics: Stage IV Metastatic, Nonsquamous, Initial Chemotherapy/Immunotherapy, PS = 0, 1, ALK Rearrangement Negative and ROS1 Rearrangement Negative and NTRK Gene Fusion?Negative and RET Gene Fusion?Negative and EGFR Mutation Negative/Non?Sensitizing, PD-L1  Expression Positive  ? 50% (TPS) and Immunotherapy Candidate AJCC T Category: T1b Current Disease Status: Distant Metastases AJCC N Category: N3 AJCC M Category: M1c AJCC 8 Stage Grouping: IVB Histology: Nonsquamous Cell ROS1 Rearrangement Status: Negative [id number] Mutation Status: Not Applicable - EGFR Mutation Negative/Unknown Other Mutations/Biomarkers: No Other Actionable Mutations Chemotherapy/Immunotherapy LOT: Initial Chemotherapy/Immunotherapy Molecular Targeted Therapy: Not Appropriate MET Exon 14 Mutation Status: Negative RET Gene Fusion Status: Negative EGFR Mutation Status: Negative/Wild Type NTRK Gene Fusion Status: Negative PD-L1 Expression Status: PD-L1 Positive ? 50% (TPS) ALK Rearrangement Status: Negative BRAF [id number] Mutation Status: Negative ECOG Performance Status: 1 Biomarker Assessment Status Confirmation: All Genomic Markers Negative or Only MET+ or BRAF+ Immunotherapy Candidate Status: Candidate for Immunotherapy Intent of Therapy: Non-Curative / Palliative Intent, Discussed with Patient

## 2019-10-30 NOTE — Progress Notes (Signed)
[place] [phone]   [phone]  OFFICE PROGRESS NOTE  Heywood Bene, PA-C [address] [place] [address]  DIAGNOSIS: Stage IV, T1b, N3, M1 C) non-small cell lung cancer, adenocarcinoma diagnosed in November 2020 and presented with multiple pulmonary nodules mainly in the right lung as well as the left lung in addition to mediastinal and supraclavicular lymphadenopathy and multiple metastatic brain lesions.  Biomarker Findings Microsatellite status - MS-Stable Tumor Mutational Burden - 3 Muts/Mb Genomic Findings For a complete list of the genes assayed, please refer to the Appendix. KRAS G12C CTNNB1 G757f*5 [id number] site [id number]>PTET2 [id number] TP53 Q136E - subclonal, R337H? 7 Disease relevant genes with no reportable alterations: ALK, BRAF, EGFR, ERBB2, MET, RET, ROS1  PDL1 Expression 100%  PRIOR THERAPY: SRS to multiple brain metastasis under the care of Dr. MoLisbeth Renshaw CURRENT THERAPY: Immunotherapy with Keytruda 200 mg IV every 3 weeks.  First dose November 06, 2019.  INTERVAL HISTORY: Geoffrey GROESBECK79.o. male returns to the clinic today for follow-up visit accompanied by his wife.  The patient is feeling a little bit better today but continues to have increasing fatigue and weakness and the right upper extremity.  He was admitted to the hospital recently with seizure as well as stroke.  He completed SRS to multiple brain metastasis under the care of Dr. MoLisbeth Renshaw He denied having any current chest pain but has shortness of breath with exertion with no cough or hemoptysis.  He denied having any fever or chills.  He has no nausea, vomiting, diarrhea or constipation.  He had molecular studies by foundation 1 that showed no actionable mutation but the patient had PD-L1 expression of 100%.  The patient is here today for evaluation and discussion of his treatment options.   MEDICAL HISTORY: Past Medical  History:  Diagnosis Date  . Aortic stenosis 09/13/2018  . CAP (community acquired pneumonia) 09/13/2018  . CHF (congestive heart failure) ([place] [place]  . Difficulty walking   . DM (diabetes mellitus) ([facility]  . GERD 06/04/2009   Qualifier: Diagnosis of  By: PeHenrene PastorD, JoDocia Chuck . Hyperlipidemia 09/13/2018  . Hypertension   . Loss of weight 09/29/2008   Qualifier: Diagnosis of  By: LeBobby RumpfMA (AAMA), Patty    . Non-small cell carcinoma of right lung, stage 4 ([facility]  . PERSONAL HX COLONIC POLYPS 06/04/2009   Qualifier: Diagnosis of  By: PeHenrene PastorD, JoBlack JackAIN 09/29/2008   Qualifier: Diagnosis of  By: LeBobby RumpfMA (AAMA), Patty    . Tachycardia   . Tobacco use 09/13/2018  . Volume overload 09/13/2018    ALLERGIES:  has No Known Allergies.  MEDICATIONS:  Current Outpatient Medications  Medication Sig Dispense Refill  . apixaban (ELIQUIS) 2.5 MG TABS tablet Take 1 tablet (2.5 mg total) by mouth 2 (two) times daily. 60 tablet 0  . aspirin EC 81 MG tablet Take 81 mg by mouth daily.    . Marland Kitchentorvastatin (LIPITOR) 80 MG tablet Take 1 tablet (80 mg total) by mouth daily. 30 tablet 0  . calcium carbonate (TUMS - DOSED IN MG ELEMENTAL CALCIUM) 500 MG chewable tablet Chew 1 tablet (200 mg of elemental calcium total) by mouth 2 (two) times daily as needed for indigestion or heartburn.    . dexamethasone (DECADRON) 2 MG tablet 2 tablets twice daily x7 days then 1 tablet twice daily x7  days then 1 tablet daily x7 days and 1 tablet every other day x12 days and stop 55 tablet 0  . famotidine (PEPCID) 20 MG tablet Take 1 tablet (20 mg total) by mouth at bedtime. 30 tablet 1  . glipiZIDE (GLUCOTROL) 5 MG tablet Take 1 tablet (5 mg total) by mouth daily before breakfast. 30 tablet 1  . ibuprofen (ADVIL) 200 MG tablet Take 1 tablet (200 mg total) by mouth 2 (two) times daily as needed for headache. 30 tablet 0  . levETIRAcetam (KEPPRA) 1000 MG tablet Take 1 tablet (1,000 mg total) by mouth 2 (two) times daily. 60  tablet 1  . metFORMIN (GLUCOPHAGE) 1000 MG tablet Take 1 tablet (1,000 mg total) by mouth 2 (two) times daily with a meal. 60 tablet 0  . mirtazapine (REMERON SOL-TAB) 15 MG disintegrating tablet Take 1 tablet (15 mg total) by mouth at bedtime. 30 tablet 0  . nicotine (NICODERM CQ - DOSED IN MG/24 HOURS) 21 mg/24hr patch 21 mg patch daily x1 week then 14 mg patch daily x3 weeks then 7 mg patch daily x3 weeks and stop 28 patch 0  . Omega-3 Fatty Acids (FISH OIL) 1200 MG CAPS Take 1,200 mg by mouth daily.    Marland Kitchen oxyCODONE (OXY IR/ROXICODONE) 5 MG immediate release tablet Take 1 tablet (5 mg total) by mouth every 6 (six) hours as needed for moderate pain or severe pain. 30 tablet 0  . QUEtiapine (SEROQUEL) 25 MG tablet Take 0.5 tablets (12.5 mg total) by mouth at bedtime. 30 tablet 0  . senna-docusate (SENOKOT-S) 8.6-50 MG tablet Take 1 tablet by mouth 2 (two) times daily.     No current facility-administered medications for this visit.    SURGICAL HISTORY:  Past Surgical History:  Procedure Laterality Date  . CATARACT EXTRACTION, BILATERAL  08/2016  . DOPPLER ECHOCARDIOGRAPHY     Mitral inflow and tissue doppler consistent with impaired LV relaxation; Trileaflet aortic vlave with moderate aortic valve stenosis, Trivial mitral and tricuspid valve regurgitation.    REVIEW OF SYSTEMS:  Constitutional: positive for fatigue Eyes: negative Ears, nose, mouth, throat, and face: negative Respiratory: positive for dyspnea on exertion Cardiovascular: negative Gastrointestinal: negative Genitourinary:negative Integument/breast: negative Hematologic/lymphatic: negative Musculoskeletal:negative Neurological: positive for weakness Behavioral/Psych: negative Endocrine: negative Allergic/Immunologic: negative   PHYSICAL EXAMINATION: General appearance: alert, cooperative, fatigued and no distress Head: Normocephalic, without obvious abnormality, atraumatic Neck: no adenopathy, no JVD, supple,  symmetrical, trachea midline and thyroid not enlarged, symmetric, no tenderness/mass/nodules Lymph nodes: Cervical, supraclavicular, and axillary nodes normal. Resp: clear to auscultation bilaterally Back: symmetric, no curvature. ROM normal. No CVA tenderness. Cardio: regular rate and rhythm, S1, S2 normal, no murmur, click, rub or gallop GI: soft, non-tender; bowel sounds normal; no masses,  no organomegaly Extremities: extremities normal, atraumatic, no cyanosis or edema Neurologic: Alert and oriented X 3, normal strength and tone. Normal symmetric reflexes. Normal coordination and gait  ECOG PERFORMANCE STATUS: 1 - Symptomatic but completely ambulatory  Blood pressure 127/70, pulse (!) 111, temperature 98.3 F (36.8 C), temperature source Oral, resp. rate 18, height '6\' 1"'$  (1.854 m), weight 150 lb 9.6 oz (68.3 kg), SpO2 100 %.  LABORATORY DATA: Lab Results  Component Value Date   WBC 14.1 (H) 10/08/2019   HGB 13.3 10/08/2019   HCT 39.4 10/08/2019   MCV 91.8 10/08/2019   PLT 267 10/08/2019      Chemistry      Component Value Date/Time   NA 137 10/08/2019 0629   K 4.4  10/08/2019 0629   CL 97 (L) 10/08/2019 0629   CO2 29 10/08/2019 0629   BUN 15 10/08/2019 0629   CREATININE 0.85 10/08/2019 0629   CREATININE 0.89 09/17/2019 1156      Component Value Date/Time   CALCIUM 9.2 10/08/2019 0629   ALKPHOS 63 10/08/2019 0629   AST 11 (L) 10/08/2019 0629   ALT 28 10/08/2019 0629   BILITOT 0.3 10/08/2019 0629       RADIOGRAPHIC STUDIES: CT Head Wo Contrast  Result Date: 10/01/2019 CLINICAL DATA:  Seizure, nontraumatic. Additional history obtained from previous radiology examinations: EXAM: CT HEAD WITHOUT CONTRAST TECHNIQUE: Contiguous axial images were obtained from the base of the skull through the vertex without intravenous contrast. COMPARISON:  Brain MRI 09/02/2019 FINDINGS: Brain: The examination is mildly motion degraded. Again demonstrated is extensive vasogenic edema  within the left cerebral hemisphere, predominantly within the left frontal, parietal and occipital lobes. Multiple intracranial metastases were better appreciated on recent prior brain MRI 09/02/2019. This includes a dominant left parietal lobe metastasis previously measured at 2.2 cm. There is no definite loss of gray-white differentiation. No evidence of intracranial hemorrhage. As before, there is mass effect with partial effacement of the left lateral ventricle. Rightward midline shift may be minimally increased from prior examination, now measuring 4 mm (previously 3 mm). No extra-axial fluid collection. Cerebral volume is normal for age. Vascular: No hyperdense vessel.  Atherosclerotic calcification. Skull: Normal. Negative for fracture or focal lesion. Sinuses/Orbits: Visualized orbits demonstrate no acute abnormality. Mild ethmoid sinus mucosal thickening. No significant mastoid effusion. IMPRESSION: Mildly motion degraded examination. Extensive vasogenic edema within the left cerebral hemisphere is similar to prior MRI 07/03/2019. Multiple intracranial metastases were better appreciated on this prior study, including a dominant 2.2 cm left parietal lobe metastasis. Redemonstrated mass effect with partial effacement of the left lateral ventricle. 4 mm rightward midline shift may be minimally increased from the prior exam. No acute intracranial hemorrhage. No definite loss of gray-white differentiation. Electronically Signed   By: Kellie Simmering DO   On: 10/01/2019 12:44   MR BRAIN W WO CONTRAST  Result Date: 10/02/2019 CLINICAL DATA:  Metastatic lung cancer. EXAM: MRI HEAD WITHOUT AND WITH CONTRAST TECHNIQUE: Multiplanar, multiecho pulse sequences of the brain and surrounding structures were obtained without and with intravenous contrast. CONTRAST:  1m GADAVIST GADOBUTROL 1 MMOL/ML IV SOLN COMPARISON:  09/02/2019 FINDINGS: Multiple sequences are mildly to moderately motion degraded. Brain: A 2.6 cm  heterogeneously enhancing left parietal mass has slightly enlarged (previously 2.4 cm when measured in a similar fashion), and there is a small amount of associated chronic blood products. Extensive surrounding vasogenic edema has not significantly changed with similar partial effacement of the left lateral ventricle and 4 mm of rightward midline shift. Three enhancing cerebellar lesions measuring 2-3 mm in size have not significantly changed (series 7, images 5, 12, and 15), nor has a 3 mm right temporoparietal lesion (series 8, image 8). A punctate lesion in the posteroinferior left frontal operculum is not well seen due to motion artifact but has not enlarged (series 9, image 19). No new lesions are identified. There is a new 4 mm focus of restricted diffusion in the white matter of the left frontal lobe at the level of the centrum semiovale without enhancement consistent with an acute infarct. There is no ventriculomegaly or extra-axial fluid collection. Small foci of T2 hyperintensity in the cerebral white matter bilaterally are unchanged and nonspecific but compatible with mild chronic small vessel ischemic disease.  There are chronic lacunar infarcts in the pons and cerebellum. Vascular: Major intracranial vascular flow voids are preserved. Skull and upper cervical spine: No suspicious marrow lesion. Sinuses/Orbits: Bilateral cataract extraction. Minimal bilateral ethmoid air cell mucosal thickening. No significant mastoid fluid. Other: None. IMPRESSION: 1. 4 mm acute infarct in the left centrum semiovale. 2. Slightly increased size of left parietal metastasis with unchanged extensive vasogenic edema and mass effect. 3. Unchanged subcentimeter metastases elsewhere. 4. No new metastases identified within limitations of motion artifact. Electronically Signed   By: Logan Bores M.D.   On: 10/02/2019 14:06   EEG adult  Result Date: 10/01/2019 Lora Havens, MD     10/01/2019  2:24 PM Patient Name: Geoffrey Benjamin MRN: [id number] Epilepsy Attending: Lora Havens Referring Physician/Provider: Dr. Amie Portland Date: 10/01/2019 Duration: 25.35 minutes Patient history: 79 year old male with multiple brain mets, largest in left parietal region who presented with status epilepticus.  EEG to evaluate for seizures. Level of alertness: Awake, drowsy/sedated AEDs during EEG study: Keppra, phenytoin, Ativan Technical aspects: This EEG study was done with scalp electrodes positioned according to the 10-20 International system of electrode placement. Electrical activity was acquired at a sampling rate of '500Hz'$  and reviewed with a high frequency filter of '70Hz'$  and a low frequency filter of '1Hz'$ . EEG data were recorded continuously and digitally stored. Description: At the beginning of the study, patient was noted to have right facial twitching which stopped at 1332.  Concomitant EEG showed rhythmic 2 to 5 Hz theta-delta slowing in left hemisphere.  EEG also showed continuous 2 to 6 Hz theta-delta slowing in her left hemisphere admixed with 13 to 15 Hz generalized beta activity.  Hyperventilation and photic stimulation were not performed. 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  During the seizure, patient was noted to have right facial twitching.  Seizure ended at 1332.  EEG also showed evidence of cortical dysfunction in left hemisphere likely secondary to underlying metastatic disease. The excessive beta activity seen in the background is most likely due to the effect of benzodiazepine and is a benign EEG pattern. Dr. Rory Percy was notified and study was transitioned to long-term EEG. Priyanka Barbra Sarks   Overnight EEG with video  Result Date: 10/02/2019 Lora Havens, MD     10/02/2019 11:05 AM  Patient Name: Geoffrey Benjamin MRN: [id number] Epilepsy Attending: Lora Havens Referring Physician/Provider: Dr. Amie Portland Duration:  10/01/2019 1353 to 10/02/2019 0953  Patient history: 79 year old male with multiple brain mets, largest in left parietal region who presented with status epilepticus.  EEG to evaluate for seizures.  Level of alertness: Awake, sleep  AEDs during EEG study: Keppra, phenytoin, Ativan  Technical aspects: This EEG study was done with scalp electrodes positioned according to the 10-20 International system of electrode placement. Electrical activity was acquired at a sampling rate of '500Hz'$  and reviewed with a high frequency filter of '70Hz'$  and a low frequency filter of '1Hz'$ . EEG data were recorded continuously and digitally stored.  Description:  During awake state, no clear posterior dominant rhythm was seen.   Sleep was characterized by vertex waves, sleep spindles (12 to 14 Hz), maximal frontocentral. EEG also showed continuous 2 to 6 Hz theta-delta slowing in her left hemisphere admixed with 13 to 15 Hz generalized beta activity.  Spikes were seen  in left frontotemporal region, maximal T3, predominantly during sleep. Abnormality -Spike, left frontotemporal, maximal T3 -Continuous slow, left hemisphere - Excessive beta, generalized  IMPRESSION: This study showed evidence of epileptogenicity in left frontotemporal region.  EEG also showed evidence of cortical dysfunction in left hemisphere likely secondary to underlying metastatic disease. The excessive beta activity seen in the background is most likely due to the effect of benzodiazepine and is a benign EEG pattern.  No definite seizures were seen during the study. Lora Havens   ECHOCARDIOGRAM COMPLETE  Result Date: 10/03/2019   ECHOCARDIOGRAM REPORT   Patient Name:   Geoffrey Benjamin Date of Exam: 10/03/2019 Medical Rec #:  [id number]     Height:       73.0 in Accession #:    [id number]    Weight:       161.6 lb Date of  Birth:  05/19/1941     BSA:          1.97 m Patient Age:    24 years      BP:           145/69 mmHg Patient Gender: M             HR:           97 bpm. Exam Location:  Inpatient Procedure: 2D Echo Indications:     stroke 434.91  History:         Patient has prior history of Echocardiogram examinations, most                  recent 09/14/2018. CHF; Risk Factors:Hypertension, Diabetes,                  Dyslipidemia and Current Smoker. AS.  Sonographer:     Jannett Celestine RDCS (AE) Referring Phys:  [id number] ASHISH ARORA Diagnosing Phys: Lyman Bishop MD  Sonographer Comments: Suboptimal parasternal window. Image acquisition challenging due to respiratory motion. IMPRESSIONS  1. Technically difficult study. Suboptimal windows. Contrast not administered  2. Left ventricular ejection fraction, by visual estimation, is 55 to 60%. The left ventricle has normal function. There is moderately increased left ventricular hypertrophy.  3. Left ventricular diastolic parameters are indeterminate.  4. Global right ventricle was not well visualized.The right ventricular size is not well visualized. Right vetricular wall thickness was not assessed.  5. Left atrial size was normal.  6. Right atrial size was not well visualized.  7. Mild to moderate mitral annular calcification.  8. The mitral valve is abnormal. Trivial mitral valve regurgitation.  9. The tricuspid valve is not well visualized. 10. Aortic valve area, by VTI measures 1.18 cm. 11. Aortic valve mean gradient measures 15.5 mmHg. 12. Aortic valve peak gradient measures 29.1 mmHg. 13. The aortic valve was not well visualized. Aortic valve regurgitation is not visualized. Aortic valve leaflets are calcified. Moderate aortic valve stenosis. 14. The pulmonic valve was not well visualized. Pulmonic valve regurgitation is not visualized. 15. The interatrial septum was not well visualized. FINDINGS  Left Ventricle: Left ventricular ejection fraction, by visual estimation, is 55 to  60%. The left ventricle has normal function. The left ventricle is not well visualized. There is moderately increased left ventricular hypertrophy. Left ventricular diastolic parameters are indeterminate. Right Ventricle: The right ventricular size is not well visualized. Right vetricular wall thickness was not assessed. Global RV systolic function is was not well visualized. Left Atrium: Left atrial size was normal in size. Right Atrium: Right atrial size was not  well visualized Pericardium: There is no evidence of pericardial effusion. Mitral Valve: The mitral valve is abnormal. There is mild thickening of the mitral valve leaflet(s). Mild to moderate mitral annular calcification. Trivial mitral valve regurgitation. Tricuspid Valve: The tricuspid valve is not well visualized. Tricuspid valve regurgitation is not demonstrated. Aortic Valve: The aortic valve was not well visualized. Aortic valve regurgitation is not visualized. Moderate aortic stenosis is present. Aortic valve mean gradient measures 15.5 mmHg. Aortic valve peak gradient measures 29.1 mmHg. Aortic valve area, by  VTI measures 1.18 cm. Pulmonic Valve: The pulmonic valve was not well visualized. Pulmonic valve regurgitation is not visualized. Pulmonic regurgitation is not visualized. Aorta: The aortic root and ascending aorta are structurally normal, with no evidence of dilitation. IAS/Shunts: The interatrial septum was not well visualized.  LEFT VENTRICLE PLAX 2D LVIDd:         5.10 cm LVIDs:         3.40 cm LV PW:         1.20 cm LV IVS:        1.40 cm LVOT diam:     2.30 cm LV SV:         76 ml LV SV Index:   39.43 LVOT Area:     4.15 cm  RIGHT VENTRICLE RV S prime:     12.00 cm/s TAPSE (M-mode): 1.3 cm LEFT ATRIUM             Index LA diam:        3.90 cm 1.98 cm/m LA Vol (A2C):   41.8 ml 21.27 ml/m LA Vol (A4C):   45.1 ml 22.95 ml/m LA Biplane Vol: 44.4 ml 22.59 ml/m  AORTIC VALVE AV Area (Vmax):    1.30 cm AV Area (Vmean):   1.24 cm AV  Area (VTI):     1.18 cm AV Vmax:           269.75 cm/s AV Vmean:          183.500 cm/s AV VTI:            0.551 m AV Peak Grad:      29.1 mmHg AV Mean Grad:      15.5 mmHg LVOT Vmax:         84.50 cm/s LVOT Vmean:        54.800 cm/s LVOT VTI:          0.156 m LVOT/AV VTI ratio: 0.28  AORTA Ao Root diam: 3.40 cm  SHUNTS Systemic VTI:  0.16 m Systemic Diam: 2.30 cm  Lyman Bishop MD Electronically signed by Lyman Bishop MD Signature Date/Time: 10/03/2019/12:05:39 PM    Final (Updated)     ASSESSMENT AND PLAN: This is a very pleasant 79 years old white male recently diagnosed with a stage IV non-small cell lung cancer, adenocarcinoma in November 2020 presented with multiple pulmonary nodules in addition to right lung as well as left lung masses as well as mediastinal and supraclavicular lymphadenopathy and multiple brain metastasis.  The patient underwent SRS treatment to the brain lesions. The patient is here today for evaluation and discussion of his treatment options. His PET scan is not scheduled yet. I had a lengthy discussion with the patient and his wife today about his current condition and treatment options.  The patient understand that he has incurable condition and all the treatment would be of palliative nature. I gave him the option of palliative care versus palliative treatment with immunotherapy with single agent Keytruda since  the patient has PD-L1 expression of 100%. The patient is interested in treatment.  I discussed with him the adverse effect of this treatment including but not limited to immunotherapy mediated skin rash, diarrhea, inflammation of the lung, kidney, liver, thyroid or other endocrine dysfunction. He is expected to start the first cycle of this treatment next week. I will arrange for the patient to have a chemotherapy education class before the first dose of his treatment. I will see the patient back for follow-up visit in 4 weeks with the start of cycle #2. I will call  his pharmacy with prescription for Compazine 10 mg p.o. every 6 hours as needed for nausea. We will try to arrange for his PET scan to be done next week. The patient was advised to call immediately if he has any concerning symptoms in the interval. The patient voices understanding of current disease status and treatment options and is in agreement with the current care plan.  All questions were answered. The patient knows to call the clinic with any problems, questions or concerns. We can certainly see the patient much sooner if necessary.   Disclaimer: This note was dictated with voice recognition software. Similar sounding words can inadvertently be transcribed and may not be corrected upon review.

## 2019-10-30 NOTE — Telephone Encounter (Signed)
Oncology Nurse Navigator Documentation  Oncology Nurse Navigator Flowsheets 10/30/2019  Abnormal Finding Date 09/02/2019  Confirmed Diagnosis Date 09/03/2019  Diagnosis Status Confirmed Diagnosis Complete  Planned Course of Treatment Radiation  Phase of Treatment Radiation  Navigator Follow Up Date: -  Navigator Follow Up Reason: -  Navigator Location [facility]  Referral Date to RadOnc/MedOnc -  Navigator Encounter Type Clinic/MDC;Telephone/I spoke with patient and his wife today at clinic.  I help to explain next steps.   I contacted auth coordinator to see if PET scan needed auth and it does not.  I called central scheduling and was given an appt. I called patient and spoke with his wife.  I updated her on the appt time, place and pre-procedure instructions.  She verbalized understanding.   Telephone Education;Outgoing Call  [facility] Date -  Multidisiplinary Clinic Type -  Patient Visit Type MedOnc  Treatment Phase Pre-Tx/Tx Discussion  Barriers/Navigation Needs Coordination of Care;Education  Education Other  Interventions Coordination of Care;Education  Acuity Level 2-Minimal Needs (1-2 Barriers Identified)  Coordination of Care Appts  Education Method Verbal  Support Groups/Services -  Time Spent with Patient 30

## 2019-11-01 LAB — GUARDANT 360

## 2019-11-04 ENCOUNTER — Telehealth: Payer: Self-pay | Admitting: Cardiovascular Disease

## 2019-11-04 ENCOUNTER — Telehealth: Payer: Self-pay | Admitting: Medical Oncology

## 2019-11-04 ENCOUNTER — Other Ambulatory Visit: Payer: Self-pay | Admitting: Medical Oncology

## 2019-11-04 NOTE — Telephone Encounter (Signed)
He wants teaching done by phone tomorrow.  Confirmed his tx appts for this week.

## 2019-11-04 NOTE — Telephone Encounter (Signed)
New Message  Pt's wife called and stated that they wanted to know if the can get something else prescribed other that Eliquis because it's too expensive.  Please call to discuss

## 2019-11-04 NOTE — Telephone Encounter (Signed)
Dr. Johnsie Cancel in previous message (10/10/19) stated patient could start coumadin if he wanted to switch to something cheaper. Patient stated he has about 10 pills left of elquis. Will send to [facility] Clinic to help patient switch and get him started with his first appt.

## 2019-11-05 ENCOUNTER — Inpatient Hospital Stay: Payer: Medicare Other | Attending: Internal Medicine

## 2019-11-05 DIAGNOSIS — C3492 Malignant neoplasm of unspecified part of left bronchus or lung: Secondary | ICD-10-CM | POA: Insufficient documentation

## 2019-11-05 DIAGNOSIS — C770 Secondary and unspecified malignant neoplasm of lymph nodes of head, face and neck: Secondary | ICD-10-CM | POA: Insufficient documentation

## 2019-11-05 DIAGNOSIS — Z5112 Encounter for antineoplastic immunotherapy: Secondary | ICD-10-CM | POA: Insufficient documentation

## 2019-11-05 DIAGNOSIS — C7931 Secondary malignant neoplasm of brain: Secondary | ICD-10-CM | POA: Insufficient documentation

## 2019-11-05 DIAGNOSIS — Z79899 Other long term (current) drug therapy: Secondary | ICD-10-CM | POA: Insufficient documentation

## 2019-11-05 DIAGNOSIS — C3491 Malignant neoplasm of unspecified part of right bronchus or lung: Secondary | ICD-10-CM | POA: Insufficient documentation

## 2019-11-05 MED ORDER — WARFARIN SODIUM 5 MG PO TABS: 5.0000 mg | ORAL_TABLET | Freq: Every day | ORAL | 0 refills | Status: DC

## 2019-11-05 NOTE — Telephone Encounter (Signed)
Called and spoke to pt's wife (pt with wife). Instructed for pt to start taking warfarin, 5mg  daily in the evening starting on 11/07/2019 and instructed for pt to continue taking Eliquis as perscibed but last dose should be on 11/09/2019. Pt will overlap on warfarin and Eliquis for 3 days. Scheduled for pt to come in to get his INR checked on 11/12/2019. Gave them number to coumadin clinic ([phone]) to call for any questions.  Reviewed dosing instructions with pharm D prior to giving pt dosing instructions.

## 2019-11-06 ENCOUNTER — Other Ambulatory Visit: Payer: Self-pay

## 2019-11-06 ENCOUNTER — Inpatient Hospital Stay: Payer: Medicare Other

## 2019-11-06 VITALS — BP 135/73 | HR 113 | Temp 98.3°F | Resp 20 | Ht 73.0 in | Wt 150.0 lb

## 2019-11-06 DIAGNOSIS — R5382 Chronic fatigue, unspecified: Secondary | ICD-10-CM

## 2019-11-06 DIAGNOSIS — C770 Secondary and unspecified malignant neoplasm of lymph nodes of head, face and neck: Secondary | ICD-10-CM | POA: Diagnosis not present

## 2019-11-06 DIAGNOSIS — C3491 Malignant neoplasm of unspecified part of right bronchus or lung: Secondary | ICD-10-CM

## 2019-11-06 DIAGNOSIS — C7931 Secondary malignant neoplasm of brain: Secondary | ICD-10-CM | POA: Diagnosis not present

## 2019-11-06 DIAGNOSIS — Z79899 Other long term (current) drug therapy: Secondary | ICD-10-CM | POA: Diagnosis not present

## 2019-11-06 DIAGNOSIS — Z5112 Encounter for antineoplastic immunotherapy: Secondary | ICD-10-CM | POA: Diagnosis present

## 2019-11-06 DIAGNOSIS — C3492 Malignant neoplasm of unspecified part of left bronchus or lung: Secondary | ICD-10-CM | POA: Diagnosis present

## 2019-11-06 LAB — CMP (CANCER CENTER ONLY)
ALT: 272 U/L — ABNORMAL HIGH (ref 0–44)
AST: 77 U/L — ABNORMAL HIGH (ref 15–41)
Albumin: 2.9 g/dL — ABNORMAL LOW (ref 3.5–5.0)
Alkaline Phosphatase: 389 U/L — ABNORMAL HIGH (ref 38–126)
Anion gap: 12 (ref 5–15)
BUN: 9 mg/dL (ref 8–23)
CO2: 27 mmol/L (ref 22–32)
Calcium: 9.3 mg/dL (ref 8.9–10.3)
Chloride: 99 mmol/L (ref 98–111)
Creatinine: 0.75 mg/dL (ref 0.61–1.24)
GFR, Est AFR Am: >60 mL/min (ref >60)
GFR, Estimated: >60 mL/min (ref >60)
Glucose, Bld: 93 mg/dL (ref 70–99)
Potassium: 3.8 mmol/L (ref 3.5–5.1)
Sodium: 138 mmol/L (ref 135–145)
Total Bilirubin: 1.2 mg/dL (ref 0.3–1.2)
Total Protein: 6.7 g/dL (ref 6.5–8.1)

## 2019-11-06 LAB — CBC WITH DIFFERENTIAL (CANCER CENTER ONLY)
Abs Immature Granulocytes: 0.12 10*3/uL — ABNORMAL HIGH (ref 0.00–0.07)
Basophils Absolute: 0.1 10*3/uL (ref 0.0–0.1)
Basophils Relative: 1 %
Eosinophils Absolute: 0.6 10*3/uL — ABNORMAL HIGH (ref 0.0–0.5)
Eosinophils Relative: 5 %
HCT: 38.1 % — ABNORMAL LOW (ref 39.0–52.0)
Hemoglobin: 12.7 g/dL — ABNORMAL LOW (ref 13.0–17.0)
Immature Granulocytes: 1 %
Lymphocytes Relative: 11 %
Lymphs Abs: 1.4 10*3/uL (ref 0.7–4.0)
MCH: 30.8 pg (ref 26.0–34.0)
MCHC: 33.3 g/dL (ref 30.0–36.0)
MCV: 92.3 fL (ref 80.0–100.0)
Monocytes Absolute: 1 10*3/uL (ref 0.1–1.0)
Monocytes Relative: 8 %
Neutro Abs: 9.1 10*3/uL — ABNORMAL HIGH (ref 1.7–7.7)
Neutrophils Relative %: 74 %
Platelet Count: 267 10*3/uL (ref 150–400)
RBC: 4.13 MIL/uL — ABNORMAL LOW (ref 4.22–5.81)
RDW: 16 % — ABNORMAL HIGH (ref 11.5–15.5)
WBC Count: 12.3 10*3/uL — ABNORMAL HIGH (ref 4.0–10.5)
nRBC: 0 % (ref 0.0–0.2)

## 2019-11-06 LAB — TSH: TSH: 2.342 u[IU]/mL (ref 0.320–4.118)

## 2019-11-06 MED ORDER — SODIUM CHLORIDE 0.9 % IV SOLN
Freq: Once | INTRAVENOUS | Status: AC
  Filled 2019-11-06: qty 250

## 2019-11-06 MED ORDER — ACETAMINOPHEN 325 MG PO TABS
ORAL_TABLET | ORAL | Status: AC
  Filled 2019-11-06: qty 2

## 2019-11-06 MED ORDER — ACETAMINOPHEN 325 MG PO TABS
650.0000 mg | ORAL_TABLET | ORAL | Status: AC
  Administered 2019-11-06: 10:00:00 650 mg via ORAL

## 2019-11-06 MED ORDER — SODIUM CHLORIDE 0.9 % IV SOLN
200.0000 mg | Freq: Once | INTRAVENOUS | Status: AC
  Administered 2019-11-06: 200 mg via INTRAVENOUS
  Filled 2019-11-06: qty 8

## 2019-11-06 NOTE — Progress Notes (Signed)
OK to treat with labs today and HR 113 per MD [facility]  Before starting Keytruda, pt c/o headache. Tylenol given per Sandi Mealy, PA (see eMAR). 1015: Pt reports feeling relief from headache.

## 2019-11-06 NOTE — Patient Instructions (Addendum)
[place] Discharge Instructions for Patients Receiving Chemotherapy  Today you received the following chemotherapy agents Keytruda  To help prevent nausea and vomiting after your treatment, we encourage you to take your nausea medication as directed.    If you develop nausea and vomiting that is not controlled by your nausea medication, call the clinic.   BELOW ARE SYMPTOMS THAT SHOULD BE REPORTED IMMEDIATELY:  *FEVER GREATER THAN 100.5 F  *CHILLS WITH OR WITHOUT FEVER  NAUSEA AND VOMITING THAT IS NOT CONTROLLED WITH YOUR NAUSEA MEDICATION  *UNUSUAL SHORTNESS OF BREATH  *UNUSUAL BRUISING OR BLEEDING  TENDERNESS IN MOUTH AND THROAT WITH OR WITHOUT PRESENCE OF ULCERS  *URINARY PROBLEMS  *BOWEL PROBLEMS  UNUSUAL RASH Items with * indicate a potential emergency and should be followed up as soon as possible.  Feel free to call the clinic should you have any questions or concerns. The clinic phone number is [phone].  Please show the [place] at check-in to the Emergency Department and triage nurse.  Pembrolizumab injection What is this medicine? PEMBROLIZUMAB (pem broe liz ue mab) is a monoclonal antibody. It is used to treat certain types of cancer. This medicine may be used for other purposes; ask your health care provider or pharmacist if you have questions. COMMON BRAND NAME(S): Keytruda What should I tell my health care provider before I take this medicine? They need to know if you have any of these conditions:  diabetes  immune system problems  inflammatory bowel disease  liver disease  lung or breathing disease  lupus  received or scheduled to receive an organ transplant or a stem-cell transplant that uses donor stem cells  an unusual or allergic reaction to pembrolizumab, other medicines, foods, dyes, or preservatives  pregnant or trying to get pregnant  breast-feeding How should I use this medicine? This medicine is for  infusion into a vein. It is given by a health care professional in a hospital or clinic setting. A special MedGuide will be given to you before each treatment. Be sure to read this information carefully each time. Talk to your pediatrician regarding the use of this medicine in children. While this drug may be prescribed for children as young as 6 months for selected conditions, precautions do apply. Overdosage: If you think you have taken too much of this medicine contact a poison control center or emergency room at once. NOTE: This medicine is only for you. Do not share this medicine with others. What if I miss a dose? It is important not to miss your dose. Call your doctor or health care professional if you are unable to keep an appointment. What may interact with this medicine? Interactions have not been studied. Give your health care provider a list of all the medicines, herbs, non-prescription drugs, or dietary supplements you use. Also tell them if you smoke, drink alcohol, or use illegal drugs. Some items may interact with your medicine. This list may not describe all possible interactions. Give your health care provider a list of all the medicines, herbs, non-prescription drugs, or dietary supplements you use. Also tell them if you smoke, drink alcohol, or use illegal drugs. Some items may interact with your medicine. What should I watch for while using this medicine? Your condition will be monitored carefully while you are receiving this medicine. You may need blood work done while you are taking this medicine. Do not become pregnant while taking this medicine or for 4 months after stopping it. Women  should inform their doctor if they wish to become pregnant or think they might be pregnant. There is a potential for serious side effects to an unborn child. Talk to your health care professional or pharmacist for more information. Do not breast-feed an infant while taking this medicine or for 4  months after the last dose. What side effects may I notice from receiving this medicine? Side effects that you should report to your doctor or health care professional as soon as possible:  allergic reactions like skin rash, itching or hives, swelling of the face, lips, or tongue  bloody or black, tarry  breathing problems  changes in vision  chest pain  chills  confusion  constipation  cough  diarrhea  dizziness or feeling faint or lightheaded  fast or irregular heartbeat  fever  flushing  joint pain  low blood counts - this medicine may decrease the number of white blood cells, red blood cells and platelets. You may be at increased risk for infections and bleeding.  muscle pain  muscle weakness  pain, tingling, numbness in the hands or feet  persistent headache  redness, blistering, peeling or loosening of the skin, including inside the mouth  signs and symptoms of high blood sugar such as dizziness; dry mouth; dry skin; fruity breath; nausea; stomach pain; increased hunger or thirst; increased urination  signs and symptoms of kidney injury like trouble passing urine or change in the amount of urine  signs and symptoms of liver injury like dark urine, light-colored stools, loss of appetite, nausea, right upper belly pain, yellowing of the eyes or skin  sweating  swollen lymph nodes  weight loss Side effects that usually do not require medical attention (report to your doctor or health care professional if they continue or are bothersome):  decreased appetite  hair loss  muscle pain  tiredness This list may not describe all possible side effects. Call your doctor for medical advice about side effects. You may report side effects to FDA at 1-800-FDA-1088. Where should I keep my medicine? This drug is given in a hospital or clinic and will not be stored at home. NOTE: This sheet is a summary. It may not cover all possible information. If you have  questions about this medicine, talk to your doctor, pharmacist, or health care provider.  2020 Elsevier/Gold Standard (2019-07-26 18:07:58)

## 2019-11-11 ENCOUNTER — Other Ambulatory Visit: Payer: Self-pay | Admitting: Medical Oncology

## 2019-11-11 DIAGNOSIS — C3491 Malignant neoplasm of unspecified part of right bronchus or lung: Secondary | ICD-10-CM

## 2019-11-12 ENCOUNTER — Other Ambulatory Visit: Payer: Self-pay

## 2019-11-12 ENCOUNTER — Encounter (HOSPITAL_COMMUNITY)
Admission: RE | Admit: 2019-11-12 | Discharge: 2019-11-12 | Disposition: A | Discharge status: Home / Self Care | Payer: Medicare Other | Source: Ambulatory Visit | Attending: Internal Medicine | Admitting: Internal Medicine

## 2019-11-12 ENCOUNTER — Ambulatory Visit (HOSPITAL_COMMUNITY)
Admission: RE | Admit: 2019-11-12 | Discharge: 2019-11-12 | Disposition: A | Discharge status: Home / Self Care | Payer: Medicare Other | Source: Ambulatory Visit | Attending: Internal Medicine | Admitting: Internal Medicine

## 2019-11-12 ENCOUNTER — Encounter (HOSPITAL_COMMUNITY): Payer: Self-pay

## 2019-11-12 DIAGNOSIS — K5730 Diverticulosis of large intestine without perforation or abscess without bleeding: Secondary | ICD-10-CM | POA: Insufficient documentation

## 2019-11-12 DIAGNOSIS — I2510 Atherosclerotic heart disease of native coronary artery without angina pectoris: Secondary | ICD-10-CM | POA: Insufficient documentation

## 2019-11-12 DIAGNOSIS — C7951 Secondary malignant neoplasm of bone: Secondary | ICD-10-CM | POA: Diagnosis not present

## 2019-11-12 DIAGNOSIS — C3491 Malignant neoplasm of unspecified part of right bronchus or lung: Secondary | ICD-10-CM

## 2019-11-12 DIAGNOSIS — J439 Emphysema, unspecified: Secondary | ICD-10-CM | POA: Diagnosis not present

## 2019-11-12 DIAGNOSIS — I714 Abdominal aortic aneurysm, without rupture: Secondary | ICD-10-CM | POA: Insufficient documentation

## 2019-11-12 DIAGNOSIS — I700 Atherosclerosis of aorta: Secondary | ICD-10-CM | POA: Insufficient documentation

## 2019-11-12 DIAGNOSIS — I313 Pericardial effusion (noninflammatory): Secondary | ICD-10-CM | POA: Diagnosis not present

## 2019-11-12 DIAGNOSIS — C787 Secondary malignant neoplasm of liver and intrahepatic bile duct: Secondary | ICD-10-CM | POA: Diagnosis not present

## 2019-11-12 LAB — GLUCOSE, CAPILLARY: Glucose-Capillary: 138 mg/dL — ABNORMAL HIGH (ref 70–99)

## 2019-11-12 MED ORDER — FLUDEOXYGLUCOSE F - 18 (FDG) INJECTION
7.3000 | Freq: Once | INTRAVENOUS | Status: AC
  Administered 2019-11-12: 7.3 via INTRAVENOUS

## 2019-11-14 ENCOUNTER — Ambulatory Visit (INDEPENDENT_AMBULATORY_CARE_PROVIDER_SITE_OTHER): Payer: Medicare Other | Admitting: *Deleted

## 2019-11-14 ENCOUNTER — Other Ambulatory Visit: Payer: Self-pay

## 2019-11-14 DIAGNOSIS — Z5181 Encounter for therapeutic drug level monitoring: Secondary | ICD-10-CM | POA: Insufficient documentation

## 2019-11-14 DIAGNOSIS — D6859 Other primary thrombophilia: Secondary | ICD-10-CM | POA: Insufficient documentation

## 2019-11-14 DIAGNOSIS — I639 Cerebral infarction, unspecified: Secondary | ICD-10-CM | POA: Insufficient documentation

## 2019-11-14 LAB — POCT INR: INR: 2.2 (ref 2.0–3.0)

## 2019-11-14 NOTE — Patient Instructions (Addendum)
Description   Start taking 1/2 a tablet daily except for 1 tablet on Tuesdays and Thursdays. Recheck INR in 1 week. Call for any changes in medications or upcoming procedure. Anticoagulation clinic [phone].    A full discussion of the nature of anticoagulants has been carried out.  A benefit risk analysis has been presented to the patient, so that they understand the justification for choosing anticoagulation at this time. The need for frequent and regular monitoring, precise dosage adjustment and compliance is stressed.  Side effects of potential bleeding are discussed.  The patient should avoid any OTC items containing aspirin or ibuprofen, and should avoid great swings in general diet.  Avoid alcohol consumption.  Call if any signs of abnormal bleeding.

## 2019-11-15 ENCOUNTER — Telehealth: Payer: Self-pay | Admitting: Medical Oncology

## 2019-11-15 NOTE — Telephone Encounter (Signed)
Voiding-Frequent urination , small amounts. No other symptoms. He declined lab appt today . I encouraged pt to drink 64 oz of water , non caffeine drinks and I instructed [place] to go to urgent care or ED if symptoms worsen or fever, burning with urination or back pain. He voiced understanding. Keytruda given 10 days ago.

## 2019-11-22 ENCOUNTER — Telehealth: Payer: Self-pay

## 2019-11-22 ENCOUNTER — Ambulatory Visit (INDEPENDENT_AMBULATORY_CARE_PROVIDER_SITE_OTHER): Payer: Medicare Other | Admitting: *Deleted

## 2019-11-22 ENCOUNTER — Other Ambulatory Visit: Payer: Self-pay

## 2019-11-22 DIAGNOSIS — Z5181 Encounter for therapeutic drug level monitoring: Secondary | ICD-10-CM | POA: Diagnosis not present

## 2019-11-22 LAB — PROTIME-INR
INR: >10 (ref 0.9–1.2)
INR: 9.5 (ref 0.8–1.2)
Prothrombin Time: >120 s (ref 9.1–12.0)
Prothrombin Time: 76.8 seconds — ABNORMAL HIGH (ref 11.4–15.2)

## 2019-11-22 LAB — POCT INR: INR: 8 — AB (ref 2.0–3.0)

## 2019-11-22 NOTE — Progress Notes (Signed)
Pt's INR greater then 8. Pt sent to lab to get INR. Pt instructed not take any Warfarin until called with updated dosing instructions. Pt aware that he is at a higher risk for bleeding and to seek medical attention if he has any bleeding or falls. Pt aware that he should be expecting a phone call today with updated lab results. Wife present for visit.

## 2019-11-22 NOTE — Telephone Encounter (Signed)
Relayed INR to Coumadin Clinic.

## 2019-11-22 NOTE — Patient Instructions (Addendum)
Description    Called and spoke to pt's wife instructed for pt to hold warfarin until his appointment on Tuesday 2/23 and further dosing instructions given. Normal dose:1/2 a tablet daily except for 1 tablet on Tuesdays, Thursdays and Saturdays. Call for any changes in medications or upcoming procedure. Anticoagulation clinic [phone].

## 2019-11-22 NOTE — Telephone Encounter (Signed)
I received a Critical INR of 9.5 from [place] (Lab at [facility]).  I addressed this with Ebony Hail from our Coumadin Clinic.  She verbalized understanding.

## 2019-11-25 ENCOUNTER — Telehealth: Payer: Self-pay

## 2019-11-25 ENCOUNTER — Emergency Department (HOSPITAL_COMMUNITY): Payer: Medicare Other

## 2019-11-25 ENCOUNTER — Other Ambulatory Visit: Payer: Self-pay

## 2019-11-25 ENCOUNTER — Inpatient Hospital Stay (HOSPITAL_COMMUNITY)
Admission: EM | Admit: 2019-11-25 | Discharge: 2019-11-27 | DRG: 442 | Disposition: A | Discharge status: Home / Self Care | Payer: Medicare Other | Attending: Internal Medicine | Admitting: Internal Medicine

## 2019-11-25 ENCOUNTER — Encounter (HOSPITAL_COMMUNITY): Payer: Self-pay | Admitting: Emergency Medicine

## 2019-11-25 DIAGNOSIS — E11649 Type 2 diabetes mellitus with hypoglycemia without coma: Secondary | ICD-10-CM | POA: Diagnosis present

## 2019-11-25 DIAGNOSIS — C3490 Malignant neoplasm of unspecified part of unspecified bronchus or lung: Secondary | ICD-10-CM

## 2019-11-25 DIAGNOSIS — C7931 Secondary malignant neoplasm of brain: Secondary | ICD-10-CM | POA: Diagnosis present

## 2019-11-25 DIAGNOSIS — I350 Nonrheumatic aortic (valve) stenosis: Secondary | ICD-10-CM | POA: Diagnosis present

## 2019-11-25 DIAGNOSIS — C7801 Secondary malignant neoplasm of right lung: Secondary | ICD-10-CM | POA: Diagnosis present

## 2019-11-25 DIAGNOSIS — R358 Other polyuria: Secondary | ICD-10-CM | POA: Diagnosis not present

## 2019-11-25 DIAGNOSIS — G40109 Localization-related (focal) (partial) symptomatic epilepsy and epileptic syndromes with simple partial seizures, not intractable, without status epilepticus: Secondary | ICD-10-CM | POA: Diagnosis not present

## 2019-11-25 DIAGNOSIS — E440 Moderate protein-calorie malnutrition: Secondary | ICD-10-CM | POA: Diagnosis present

## 2019-11-25 DIAGNOSIS — Z923 Personal history of irradiation: Secondary | ICD-10-CM

## 2019-11-25 DIAGNOSIS — Z66 Do not resuscitate: Secondary | ICD-10-CM | POA: Diagnosis present

## 2019-11-25 DIAGNOSIS — Z7982 Long term (current) use of aspirin: Secondary | ICD-10-CM

## 2019-11-25 DIAGNOSIS — C3492 Malignant neoplasm of unspecified part of left bronchus or lung: Secondary | ICD-10-CM | POA: Diagnosis present

## 2019-11-25 DIAGNOSIS — E785 Hyperlipidemia, unspecified: Secondary | ICD-10-CM | POA: Diagnosis present

## 2019-11-25 DIAGNOSIS — R319 Hematuria, unspecified: Secondary | ICD-10-CM | POA: Diagnosis present

## 2019-11-25 DIAGNOSIS — K7290 Hepatic failure, unspecified without coma: Secondary | ICD-10-CM

## 2019-11-25 DIAGNOSIS — G40909 Epilepsy, unspecified, not intractable, without status epilepticus: Secondary | ICD-10-CM | POA: Diagnosis present

## 2019-11-25 DIAGNOSIS — C7802 Secondary malignant neoplasm of left lung: Secondary | ICD-10-CM | POA: Diagnosis present

## 2019-11-25 DIAGNOSIS — I5022 Chronic systolic (congestive) heart failure: Secondary | ICD-10-CM | POA: Diagnosis present

## 2019-11-25 DIAGNOSIS — Z79899 Other long term (current) drug therapy: Secondary | ICD-10-CM

## 2019-11-25 DIAGNOSIS — R3589 Other polyuria: Secondary | ICD-10-CM

## 2019-11-25 DIAGNOSIS — Z515 Encounter for palliative care: Secondary | ICD-10-CM | POA: Diagnosis present

## 2019-11-25 DIAGNOSIS — Z7901 Long term (current) use of anticoagulants: Secondary | ICD-10-CM

## 2019-11-25 DIAGNOSIS — D6859 Other primary thrombophilia: Secondary | ICD-10-CM | POA: Diagnosis present

## 2019-11-25 DIAGNOSIS — F17210 Nicotine dependence, cigarettes, uncomplicated: Secondary | ICD-10-CM | POA: Diagnosis present

## 2019-11-25 DIAGNOSIS — E162 Hypoglycemia, unspecified: Secondary | ICD-10-CM

## 2019-11-25 DIAGNOSIS — Z7984 Long term (current) use of oral hypoglycemic drugs: Secondary | ICD-10-CM

## 2019-11-25 DIAGNOSIS — J90 Pleural effusion, not elsewhere classified: Secondary | ICD-10-CM | POA: Diagnosis present

## 2019-11-25 DIAGNOSIS — K7200 Acute and subacute hepatic failure without coma: Principal | ICD-10-CM | POA: Diagnosis present

## 2019-11-25 DIAGNOSIS — C787 Secondary malignant neoplasm of liver and intrahepatic bile duct: Secondary | ICD-10-CM | POA: Diagnosis present

## 2019-11-25 DIAGNOSIS — I447 Left bundle-branch block, unspecified: Secondary | ICD-10-CM | POA: Diagnosis present

## 2019-11-25 DIAGNOSIS — E860 Dehydration: Secondary | ICD-10-CM | POA: Diagnosis present

## 2019-11-25 DIAGNOSIS — R627 Adult failure to thrive: Secondary | ICD-10-CM | POA: Diagnosis present

## 2019-11-25 DIAGNOSIS — Z8673 Personal history of transient ischemic attack (TIA), and cerebral infarction without residual deficits: Secondary | ICD-10-CM

## 2019-11-25 DIAGNOSIS — E119 Type 2 diabetes mellitus without complications: Secondary | ICD-10-CM

## 2019-11-25 DIAGNOSIS — C3491 Malignant neoplasm of unspecified part of right bronchus or lung: Secondary | ICD-10-CM | POA: Diagnosis present

## 2019-11-25 DIAGNOSIS — Z791 Long term (current) use of non-steroidal anti-inflammatories (NSAID): Secondary | ICD-10-CM

## 2019-11-25 DIAGNOSIS — I444 Left anterior fascicular block: Secondary | ICD-10-CM | POA: Diagnosis present

## 2019-11-25 DIAGNOSIS — Z20822 Contact with and (suspected) exposure to covid-19: Secondary | ICD-10-CM | POA: Diagnosis present

## 2019-11-25 DIAGNOSIS — Z79891 Long term (current) use of opiate analgesic: Secondary | ICD-10-CM

## 2019-11-25 DIAGNOSIS — I110 Hypertensive heart disease with heart failure: Secondary | ICD-10-CM | POA: Diagnosis present

## 2019-11-25 DIAGNOSIS — D684 Acquired coagulation factor deficiency: Secondary | ICD-10-CM | POA: Diagnosis present

## 2019-11-25 DIAGNOSIS — E871 Hypo-osmolality and hyponatremia: Secondary | ICD-10-CM | POA: Diagnosis present

## 2019-11-25 DIAGNOSIS — Z8719 Personal history of other diseases of the digestive system: Secondary | ICD-10-CM

## 2019-11-25 DIAGNOSIS — Z841 Family history of disorders of kidney and ureter: Secondary | ICD-10-CM

## 2019-11-25 DIAGNOSIS — Z823 Family history of stroke: Secondary | ICD-10-CM

## 2019-11-25 DIAGNOSIS — K529 Noninfective gastroenteritis and colitis, unspecified: Secondary | ICD-10-CM | POA: Diagnosis present

## 2019-11-25 DIAGNOSIS — E878 Other disorders of electrolyte and fluid balance, not elsewhere classified: Secondary | ICD-10-CM | POA: Diagnosis present

## 2019-11-25 DIAGNOSIS — K219 Gastro-esophageal reflux disease without esophagitis: Secondary | ICD-10-CM | POA: Diagnosis present

## 2019-11-25 DIAGNOSIS — Z85118 Personal history of other malignant neoplasm of bronchus and lung: Secondary | ICD-10-CM

## 2019-11-25 DIAGNOSIS — Z7189 Other specified counseling: Secondary | ICD-10-CM | POA: Diagnosis not present

## 2019-11-25 DIAGNOSIS — Z6821 Body mass index (BMI) 21.0-21.9, adult: Secondary | ICD-10-CM

## 2019-11-25 DIAGNOSIS — R591 Generalized enlarged lymph nodes: Secondary | ICD-10-CM | POA: Diagnosis present

## 2019-11-25 LAB — CBC
HCT: 32.7 % — ABNORMAL LOW (ref 39.0–52.0)
Hemoglobin: 11.5 g/dL — ABNORMAL LOW (ref 13.0–17.0)
MCH: 30.7 pg (ref 26.0–34.0)
MCHC: 35.2 g/dL (ref 30.0–36.0)
MCV: 87.2 fL (ref 80.0–100.0)
Platelets: 222 10*3/uL (ref 150–400)
RBC: 3.75 MIL/uL — ABNORMAL LOW (ref 4.22–5.81)
RDW: 16.4 % — ABNORMAL HIGH (ref 11.5–15.5)
WBC: 5.4 10*3/uL (ref 4.0–10.5)
nRBC: 0 % (ref 0.0–0.2)

## 2019-11-25 LAB — COMPREHENSIVE METABOLIC PANEL
ALT: 129 U/L — ABNORMAL HIGH (ref 0–44)
AST: 171 U/L — ABNORMAL HIGH (ref 15–41)
Albumin: 1.8 g/dL — ABNORMAL LOW (ref 3.5–5.0)
Alkaline Phosphatase: 882 U/L — ABNORMAL HIGH (ref 38–126)
Anion gap: 12 (ref 5–15)
BUN: 27 mg/dL — ABNORMAL HIGH (ref 8–23)
CO2: 21 mmol/L — ABNORMAL LOW (ref 22–32)
Calcium: 7 mg/dL — ABNORMAL LOW (ref 8.9–10.3)
Chloride: 92 mmol/L — ABNORMAL LOW (ref 98–111)
Creatinine, Ser: 1.14 mg/dL (ref 0.61–1.24)
GFR calc Af Amer: >60 mL/min (ref >60)
GFR calc non Af Amer: >60 mL/min (ref >60)
Glucose, Bld: 41 mg/dL — CL (ref 70–99)
Potassium: 3.6 mmol/L (ref 3.5–5.1)
Sodium: 125 mmol/L — ABNORMAL LOW (ref 135–145)
Total Bilirubin: 8.4 mg/dL — ABNORMAL HIGH (ref 0.3–1.2)
Total Protein: 4.3 g/dL — ABNORMAL LOW (ref 6.5–8.1)

## 2019-11-25 LAB — RESPIRATORY PANEL BY RT PCR (FLU A&B, COVID)
Influenza A by PCR: NEGATIVE
Influenza B by PCR: NEGATIVE
SARS Coronavirus 2 by RT PCR: NEGATIVE

## 2019-11-25 LAB — PHOSPHORUS: Phosphorus: 3.6 mg/dL (ref 2.5–4.6)

## 2019-11-25 LAB — CBG MONITORING, ED
Glucose-Capillary: 22 mg/dL — CL (ref 70–99)
Glucose-Capillary: 162 mg/dL — ABNORMAL HIGH (ref 70–99)

## 2019-11-25 LAB — TROPONIN I (HIGH SENSITIVITY)
Troponin I (High Sensitivity): 32 ng/L — ABNORMAL HIGH (ref <18)
Troponin I (High Sensitivity): 35 ng/L — ABNORMAL HIGH (ref <18)

## 2019-11-25 LAB — TYPE AND SCREEN
ABO/RH(D): A POS
Antibody Screen: NEGATIVE

## 2019-11-25 LAB — LACTIC ACID, PLASMA: Lactic Acid, Venous: 0.8 mmol/L (ref 0.5–1.9)

## 2019-11-25 LAB — PROTIME-INR
INR: 4.6 (ref 0.8–1.2)
Prothrombin Time: 43.5 seconds — ABNORMAL HIGH (ref 11.4–15.2)

## 2019-11-25 LAB — MAGNESIUM: Magnesium: 1.9 mg/dL (ref 1.7–2.4)

## 2019-11-25 LAB — GLUCOSE, CAPILLARY: Glucose-Capillary: 147 mg/dL — ABNORMAL HIGH (ref 70–99)

## 2019-11-25 LAB — ABO/RH: ABO/RH(D): A POS

## 2019-11-25 LAB — POC OCCULT BLOOD, ED: Fecal Occult Bld: POSITIVE — AB

## 2019-11-25 LAB — AMMONIA: Ammonia: 35 umol/L (ref 9–35)

## 2019-11-25 MED ORDER — OXYCODONE HCL 5 MG PO TABS
5.0000 mg | ORAL_TABLET | ORAL | Status: DC | PRN
  Administered 2019-11-26 – 2019-11-27 (×2): 5 mg via ORAL
  Filled 2019-11-25 (×2): qty 1

## 2019-11-25 MED ORDER — DEXTROSE 50 % IV SOLN
25.0000 mL | Freq: Once | INTRAVENOUS | Status: AC
  Administered 2019-11-25: 25 mL via INTRAVENOUS

## 2019-11-25 MED ORDER — DEXTROSE IN LACTATED RINGERS 5 % IV SOLN: INTRAVENOUS | Status: DC

## 2019-11-25 MED ORDER — ONDANSETRON HCL 4 MG/2ML IJ SOLN: 4.0000 mg | Freq: Four times a day (QID) | INTRAMUSCULAR | Status: DC | PRN

## 2019-11-25 MED ORDER — ALBUTEROL SULFATE (2.5 MG/3ML) 0.083% IN NEBU: 2.5000 mg | INHALATION_SOLUTION | RESPIRATORY_TRACT | Status: DC | PRN

## 2019-11-25 MED ORDER — DEXTROSE 50 % IV SOLN
INTRAVENOUS | Status: AC
  Administered 2019-11-25: 14:00:00 25 mL via INTRAVENOUS
  Filled 2019-11-25: qty 50

## 2019-11-25 MED ORDER — VITAMIN K1 10 MG/ML IJ SOLN
5.0000 mg | Freq: Once | INTRAVENOUS | Status: AC
  Administered 2019-11-25: 5 mg via INTRAVENOUS
  Filled 2019-11-25: qty 0.5

## 2019-11-25 MED ORDER — INSULIN ASPART 100 UNIT/ML ~~LOC~~ SOLN
0.0000 [IU] | Freq: Three times a day (TID) | SUBCUTANEOUS | Status: DC
  Administered 2019-11-26: 2 [IU] via SUBCUTANEOUS
  Administered 2019-11-26: 4 [IU] via SUBCUTANEOUS

## 2019-11-25 MED ORDER — QUETIAPINE FUMARATE 25 MG PO TABS
12.0000 mg | ORAL_TABLET | Freq: Every day | ORAL | Status: DC
  Administered 2019-11-25 – 2019-11-26 (×2): 12.5 mg via ORAL
  Filled 2019-11-25 (×2): qty 1

## 2019-11-25 MED ORDER — ONDANSETRON HCL 4 MG PO TABS: 4.0000 mg | ORAL_TABLET | Freq: Four times a day (QID) | ORAL | Status: DC | PRN

## 2019-11-25 MED ORDER — LEVETIRACETAM 500 MG PO TABS
1000.0000 mg | ORAL_TABLET | Freq: Two times a day (BID) | ORAL | Status: DC
  Administered 2019-11-25 – 2019-11-27 (×4): 1000 mg via ORAL
  Filled 2019-11-25 (×4): qty 2

## 2019-11-25 MED ORDER — MORPHINE SULFATE (PF) 2 MG/ML IV SOLN: 2.0000 mg | INTRAVENOUS | Status: DC | PRN

## 2019-11-25 MED ORDER — METHYLPREDNISOLONE SODIUM SUCC 125 MG IJ SOLR
60.0000 mg | Freq: Two times a day (BID) | INTRAMUSCULAR | Status: DC
  Administered 2019-11-25 – 2019-11-26 (×2): 60 mg via INTRAVENOUS
  Filled 2019-11-25 (×2): qty 2

## 2019-11-25 NOTE — ED Provider Notes (Signed)
[facility] DEPT Provider Note   CSN: [id number] Arrival date & time: 11/25/19  1229     History Chief Complaint  Patient presents with  . GI Bleeding    Geoffrey Benjamin is a 79 y.o. male.  HPI Patient has history of stage IV, T1b, N3, M1 C) non-small cell lung cancer, adenocarcinoma diagnosed in November 2020 with multiple pulmonary nodules mainly in the right lung as well as the left lung in addition to mediastinal and supraclavicular lymphadenopathy and multiple metastatic brain lesions.  He underwent SRS treatment to the brain lesions.  He is getting treated by Dr[facility] Lorna Few with [facility] for palliative therapy.  Patient reports that he has been extremely sick and weak for at least several days.  He reports he is having recurrent diarrhea.  He reports sometimes it looks dark but is not seeing frank blood.  His INR was elevated several days earlier.  Patient reports he is just getting extremely weak.  He reports he has not actually build to eat anything for several days.  He reports even when he ate a few days ago, was hardly anything.  He reports sometimes he vomits as well.  He reports he is just getting so weak he does not feel like he can continue with any of his cancer regimen until this diarrhea and inability to eat improved.  He reports he also is urinating almost every hour.  He cannot sleep at night.  He reports he thinks is because he has to get up and urinate so frequently.    Past Medical History:  Diagnosis Date  . Aortic stenosis 09/13/2018  . CAP (community acquired pneumonia) 09/13/2018  . CHF (congestive heart failure) ([place])   . Difficulty walking   . DM (diabetes mellitus) ([place])   . GERD 06/04/2009   Qualifier: Diagnosis of  By: Henrene Pastor MD, Docia Chuck   . Hyperlipidemia 09/13/2018  . Hypertension   . Loss of weight 09/29/2008   Qualifier: Diagnosis of  By: Bobby Rumpf CMA (AAMA), [place]    . Non-small cell carcinoma of right lung, stage 4  ([place])   . PERSONAL HX COLONIC POLYPS 06/04/2009   Qualifier: Diagnosis of  By: Henrene Pastor MD, [place] PAIN 09/29/2008   Qualifier: Diagnosis of  By: Bobby Rumpf CMA (AAMA), Patty    . Tachycardia   . Tobacco use 09/13/2018  . Volume overload 09/13/2018    Patient Active Problem List   Diagnosis Date Noted  . Encounter for therapeutic drug monitoring 11/14/2019  . CVA (cerebral vascular accident) ([place]) 11/14/2019  . Primary hypercoagulable state ([place]) 11/14/2019  . Encounter for antineoplastic immunotherapy 10/30/2019  . Pressure injury of skin 10/16/2019  . Aortic atherosclerosis ([place]) 10/07/2019  . Emphysema of lung ([place]) 10/07/2019  . Metastatic adenocarcinoma ([place]) 10/07/2019  . 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 Status epilepticus ([place]) 10/01/2019  . Adenocarcinoma of right lung, stage 4 ([place]) 09/05/2019  . Goals of care, counseling/discussion 09/05/2019  . Brain metastases ([place]) 09/02/2019  . Right sided weakness 09/01/2019  . Acute systolic congestive heart failure ([place]) 09/19/2018  . CAP (community acquired pneumonia) 09/13/2018  . Hyperlipidemia 09/13/2018  . Volume overload 09/13/2018  . Tobacco use 09/13/2018  . Aortic stenosis 09/13/2018  . Chronic pain 11/10/2015  . Hypertension 11/10/2015  . Tobacco dependence 11/10/2015  . GERD 06/04/2009  . PERSONAL HX COLONIC POLYPS 06/04/2009  . LOSS OF WEIGHT 09/29/2008  . RLQ PAIN 09/29/2008  .  Type 2 diabetes mellitus ([place]) 09/22/2008    Past Surgical History:  Procedure Laterality Date  . CATARACT EXTRACTION, BILATERAL  08/2016  . DOPPLER ECHOCARDIOGRAPHY     Mitral inflow and tissue doppler consistent with impaired LV relaxation; Trileaflet aortic vlave with moderate aortic valve stenosis, Trivial mitral and tricuspid valve regurgitation.       Family History  Problem Relation Age of Onset  . Kidney disease Father   . Stroke Mother     Social History   Tobacco Use  . Smoking status: Current Every  Day Smoker    Packs/day: 1.50    Types: Cigarettes  . Smokeless tobacco: Never Used  . Tobacco comment: SMOKED FOR 60 PLUS YEARS  Substance Use Topics  . Alcohol use: Yes    Comment: occas  . Drug use: Never    Home Medications Prior to Admission medications   Medication Sig Start Date End Date Taking? Authorizing Provider  aspirin EC 81 MG tablet Take 81 mg by mouth daily.   Yes [provider]  atorvastatin (LIPITOR) 80 MG tablet Take 1 tablet (80 mg total) by mouth daily. 10/15/19  Yes Angiulli, Lavon Paganini, PA-C  dexamethasone (DECADRON) 2 MG tablet 2 tablets twice daily x7 days then 1 tablet twice daily x7 days then 1 tablet daily x7 days and 1 tablet every other day x12 days and stop 10/15/19  Yes Angiulli, Lavon Paganini, PA-C  famotidine (PEPCID) 20 MG tablet Take 1 tablet (20 mg total) by mouth at bedtime. 10/15/19  Yes Angiulli, Lavon Paganini, PA-C  glipiZIDE (GLUCOTROL) 5 MG tablet Take 1 tablet (5 mg total) by mouth daily before breakfast. 10/15/19  Yes Angiulli, Lavon Paganini, PA-C  levETIRAcetam (KEPPRA) 1000 MG tablet Take 1 tablet (1,000 mg total) by mouth 2 (two) times daily. 10/15/19  Yes Angiulli, Lavon Paganini, PA-C  metFORMIN (GLUCOPHAGE) 1000 MG tablet Take 1 tablet (1,000 mg total) by mouth 2 (two) times daily with a meal. 10/15/19  Yes Angiulli, Lavon Paganini, PA-C  mirtazapine (REMERON SOL-TAB) 15 MG disintegrating tablet Take 1 tablet (15 mg total) by mouth at bedtime. 10/15/19  Yes Angiulli, Lavon Paganini, PA-C  Omega-3 Fatty Acids (FISH OIL) 1200 MG CAPS Take 1,200 mg by mouth daily.   Yes [provider]  oxyCODONE (OXY IR/ROXICODONE) 5 MG immediate release tablet Take 1 tablet (5 mg total) by mouth every 6 (six) hours as needed for moderate pain or severe pain. 10/15/19  Yes Angiulli, Lavon Paganini, PA-C  prochlorperazine (COMPAZINE) 10 MG tablet Take 1 tablet (10 mg total) by mouth every 6 (six) hours as needed for nausea or vomiting. 10/30/19  Yes Curt Bears, MD  QUEtiapine  (SEROQUEL) 25 MG tablet Take 0.5 tablets (12.5 mg total) by mouth at bedtime. 10/15/19  Yes Angiulli, Lavon Paganini, PA-C  warfarin (COUMADIN) 5 MG tablet Take 1 tablet (5 mg total) by mouth daily. Patient taking differently: Take 5 mg by mouth daily. Do not take from 11/23/19-10/26/19 Had appointment 11/26/19   Last INR 9.2 suppose to be between 2& 3 11/05/19  Yes Josue Hector, MD  calcium carbonate (TUMS - DOSED IN MG ELEMENTAL CALCIUM) 500 MG chewable tablet Chew 1 tablet (200 mg of elemental calcium total) by mouth 2 (two) times daily as needed for indigestion or heartburn. Patient not taking: Reported on 10/30/2019 10/15/19   Angiulli, Lavon Paganini, PA-C  ibuprofen (ADVIL) 200 MG tablet Take 1 tablet (200 mg total) by mouth 2 (two) times daily as needed for headache.  Patient not taking: Reported on 10/30/2019 10/15/19   Angiulli, Lavon Paganini, PA-C  nicotine (NICODERM CQ - DOSED IN MG/24 HOURS) 21 mg/24hr patch 21 mg patch daily x1 week then 14 mg patch daily x3 weeks then 7 mg patch daily x3 weeks and stop Patient not taking: Reported on 10/30/2019 10/15/19   Angiulli, Lavon Paganini, PA-C  senna-docusate (SENOKOT-S) 8.6-50 MG tablet Take 1 tablet by mouth 2 (two) times daily. Patient not taking: Reported on 10/30/2019 10/15/19   Angiulli, Lavon Paganini, PA-C    Allergies    Patient has no known allergies.  Review of Systems   Review of Systems 10 Systems reviewed and are negative for acute change except as noted in the HPI.  Physical Exam Updated Vital Signs BP 97/65   Pulse (!) 103   Temp 98.5 F (36.9 C) (Axillary) Comment: oral temp did not register with attempt  Resp 18   Ht 6' (1.829 m)   Wt 65.8 kg   SpO2 94%   BMI 19.67 kg/m   Physical Exam Constitutional:      Comments: Alert with clear mental status.  No respiratory distress at rest.  Speech is clear.  HENT:     Head: Normocephalic and atraumatic.  Eyes:     Extraocular Movements: Extraocular movements intact.  Cardiovascular:     Comments:  Borderline tachycardia.  No gross rub murmur gallop. Pulmonary:     Comments: Breath sounds are very soft.  Occasional wheeze. Abdominal:     Comments: Abdomen is soft.  Mild distention of lower abdomen.  No guarding.  I cannot appreciate a palpable or firm bladder.  No focal tenderness.  Genitourinary:    Comments: No stool in the rectal vault.  Trace amount of greenish-brown stool.  No melena.  No frank blood. Musculoskeletal:        General: Normal range of motion.     Cervical back: Neck supple.     Comments: No significant peripheral edema.  Calves are soft and nontender.  No significant wounds or cellulitis to the extremities.  Skin:    General: Skin is warm and dry.     Coloration: Skin is jaundiced.  Neurological:     General: No focal deficit present.     Mental Status: He is oriented to person, place, and time.     Coordination: Coordination normal.  Psychiatric:        Mood and Affect: Mood normal.     ED Results / Procedures / Treatments   Labs (all labs ordered are listed, but only abnormal results are displayed) Labs Reviewed  COMPREHENSIVE METABOLIC PANEL - Abnormal; Notable for the following components:      Result Value   Sodium 125 (*)    Chloride 92 (*)    CO2 21 (*)    Glucose, Bld 41 (*)    BUN 27 (*)    Calcium 7.0 (*)    Total Protein 4.3 (*)    Albumin 1.8 (*)    AST 171 (*)    ALT 129 (*)    Alkaline Phosphatase 882 (*)    Total Bilirubin 8.4 (*)    All other components within normal limits  CBC - Abnormal; Notable for the following components:   RBC 3.75 (*)    Hemoglobin 11.5 (*)    HCT 32.7 (*)    RDW 16.4 (*)    All other components within normal limits  PROTIME-INR - Abnormal; Notable for the following components:   Prothrombin  Time 43.5 (*)    INR 4.6 (*)    All other components within normal limits  POC OCCULT BLOOD, ED - Abnormal; Notable for the following components:   Fecal Occult Bld POSITIVE (*)    All other components within  normal limits  CBG MONITORING, ED - Abnormal; Notable for the following components:   Glucose-Capillary 22 (*)    All other components within normal limits  TROPONIN I (HIGH SENSITIVITY) - Abnormal; Notable for the following components:   Troponin I (High Sensitivity) 32 (*)    All other components within normal limits  RESPIRATORY PANEL BY RT PCR (FLU A&B, COVID)  LACTIC ACID, PLASMA  URINALYSIS, ROUTINE W REFLEX MICROSCOPIC  TYPE AND SCREEN  ABO/RH  TROPONIN I (HIGH SENSITIVITY)    EKG EKG Interpretation  Date/Time:  Monday November 25 2019 13:06:33 EST Ventricular Rate:  95 PR Interval:    QRS Duration: 125 QT Interval:  355 QTC Calculation: 447 R Axis:   -56 Text Interpretation: Sinus tachycardia Atrial premature complexes Left bundle branch block Baseline wander in lead(s) II aVR V1 V3 V5 V6 similar to previous tracing Confirmed by Charlesetta Shanks [phone]) on 11/25/2019 2:09:27 PM   Radiology DG Chest Port 1 View  Result Date: 11/25/2019 CLINICAL DATA:  Weakness, decreased appetite EXAM: PORTABLE CHEST 1 VIEW COMPARISON:  11/12/2019 FINDINGS: Stable cardiomediastinal contours. Calcific aortic knob. Elevation of the right hemidiaphragm appears new from prior. Streaky right basilar opacity, which could reflect atelectasis. Known bilateral pulmonary nodules are only faintly visible radiographically. The right costophrenic angle was excluded from the field of view. No pneumothorax. IMPRESSION: New elevation of the right hemidiaphragm with streaky right basilar opacity, which could reflect pleural effusion and/or atelectasis. Electronically Signed   By: Davina Poke D.O.   On: 11/25/2019 13:25    Procedures Procedures (including critical care time) CRITICAL CARE Performed by: Charlesetta Shanks   Total critical care time: 30 minutes  Critical care time was exclusive of separately billable procedures and treating other patients.  Critical care was necessary to treat or  prevent imminent or life-threatening deterioration.  Critical care was time spent personally by me on the following activities: development of treatment plan with patient and/or surrogate as well as nursing, discussions with consultants, evaluation of patient's response to treatment, examination of patient, obtaining history from patient or surrogate, ordering and performing treatments and interventions, ordering and review of laboratory studies, ordering and review of radiographic studies, pulse oximetry and re-evaluation of patient's condition. Medications Ordered in ED Medications  dextrose 50 % solution (25 mLs Intravenous Given 11/25/19 1427)  dextrose 50 % solution 25 mL (25 mLs Intravenous Given 11/25/19 1452)    ED Course  I have reviewed the triage vital signs and the nursing notes.  Pertinent labs & imaging results that were available during my care of the patient were reviewed by me and considered in my medical decision making (see chart for details).  Clinical Course as of Nov 24 1633  Mon Nov 25, 2019  1551 Rectal exam: No formed stool in the vault.  Trace brownish-green stool.  No blood. No melena   [MP]    Clinical Course User Index [MP] Charlesetta Shanks, MD   MDM Rules/Calculators/A&P                      Patient has severe, advanced metastatic non-small cell lung cancer.  He is being treated palliatively by oncology.  Patient has developed several symptoms.  He  has been unable to eat and had persistent diarrhea.  Initial chemistry had glucose value of 44.  Patient's mental status is not reflective of severe hypoglycemia.  As I converse with him he is alert and oriented.  Recheck by fingerstick showed a CBG of 22.  Patient had gotten a half amp of D50.  There has been no change in his mental status.  He does not seem obtunded or lethargic.  Will initiate a D5 lactic ringer drip.  There has been a significant jump in the patient's hepatic function tests.  He does not have  significantly reproducible right upper quadrant pain.  His main complaint is urinary frequency and urgency.  We will proceed with CT scan.  With metastatic disease this may represent liver involvement or obstruction without pain.  Patient's INR is supratherapeutic.  It has trended down since earlier this week.  He is now 6.  Reports his stool has been dark.  It does test positive however on exam the stool is greenish-brown and does not appear melanotic and there is no blood or clots in the vault.  From perspective GI bleed, patient appears stable.  Any issues at this time appear to be decreased oral intake with hypoglycemia and intractable diarrhea.  We will plan for admission for symptomatic treatment and further diagnostic evaluation as indicated. Final Clinical Impression(s) / ED Diagnoses Final diagnoses:  Hypoglycemia  Liver failure without hepatic coma, unspecified chronicity ([place])  Primary malignant neoplasm of lung metastatic to other site, unspecified laterality ([place])  Polyuria    Rx / DC Orders ED Discharge Orders    None       Charlesetta Shanks, MD 11/25/19 1643

## 2019-11-25 NOTE — Telephone Encounter (Signed)
Persistent Symptoms: Received VM message from patient's wife Geoffrey Benjamin regarding patient's persistent symptoms. TCT to patient and spoke with wife, she reported that patient has been having diarrhea, unable to eat, difficulty voiding and when he does there is noted blood. Asked wife to take patient to the ED but she responded that he is unable to walk. Advised that she call 911 to get EMS dispatched to help transport patient to the ED. Geoffrey Benjamin was agreeable with this and verbalized understanding.

## 2019-11-25 NOTE — H&P (Signed)
History and Physical    Geoffrey Benjamin ZHG:[id number] DOB: [birth year] DOA: 11/25/2019  PCP: Heywood Bene, PA-C  Patient coming from: Home   I have personally briefly reviewed patient's old medical records available.   Chief Complaint: weakness, falling, not eating , hematuria ,   HPI: Geoffrey Benjamin is a 79 y.o. 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 According to the patient, he did not do well since last 3 weeks.  Patient reports multiple episodes of diarrhea, 3-4 loose bowel movements a day, urinating every 1 or 2 hours and many times mixed with blood, no appetite, ongoing nausea.  He thinks he might have eaten few bites many days ago.  Reports vomiting but no hematemesis.  Shortness of breath is gradually worsening.  He cannot sleep. ED Course: Blood pressure stable after 250 mL bolus given in route.  Afebrile.  On room air, 2 L oxygen for comfort.  Hemoglobin is a stable.  Sodium 125, chloride 92, renal functions normal.  INR 4.6.  Glucose was 41, however patient was alert and awake. Liver function test shows acute liver failure with bilirubin more than 8.4, transaminitis with AST 171, ALT 129.  His albumin is 1.8. CT scan of the chest abdomen shows multiple metastatic lesion, edema of the distal ileum.  Loculated right pleural effusion with extensive infiltrate versus tumor burden.  Metastatic pulmonary nodules bilaterally. Patient started on dextrose infusion and admission requested.  Review of Systems: all systems are reviewed and pertinent positive as per HPI otherwise rest are negative.    Past Medical History:  Diagnosis Date    . Aortic stenosis 09/13/2018  . CAP (community acquired pneumonia) 09/13/2018  . CHF (congestive heart failure) ([place])   . Difficulty walking   . DM (diabetes mellitus) ([place])   . GERD 06/04/2009   Qualifier: Diagnosis of  By: Henrene Pastor MD, Docia Chuck   . Hyperlipidemia 09/13/2018  . Hypertension   . Loss of weight 09/29/2008   Qualifier: Diagnosis of  By: Bobby Rumpf CMA (AAMA), Pat[place]    . Non-small cell carcinoma of right lung, stage 4 ([place])   . PERSONAL HX COLONIC POLYPS 06/04/2009   Qualifier: Diagnosis of  By: Henrene Pastor MD, [place] PAIN 09/29/2008   Qualifier: Diagnosis of  By: Bobby Rumpf CMA (AAMA), Patty    . Tachycardia   . Tobacco use 09/13/2018  . Volume overload 09/13/2018    Past Surgical History:  Procedure Laterality Date  . CATARACT EXTRACTION, BILATERAL  08/2016  . DOPPLER ECHOCARDIOGRAPHY     Mitral inflow and tissue doppler consistent with impaired LV relaxation; Trileaflet aortic vlave with moderate aortic valve stenosis, Trivial mitral and tricuspid valve regurgitation.     reports that he has been smoking cigarettes. He has been smoking about 1.50 packs per day. He has never used smokeless tobacco. He reports current alcohol use. He reports that he does not use drugs.  No Known Allergies  Family History  Problem Relation Age of Onset  . Kidney disease Father   . Stroke Mother      Prior to Admission medications   Medication Sig Start Date End Date Taking? Authorizing Provider  aspirin EC 81 MG tablet Take 81 mg by mouth daily.   Yes [provider]  atorvastatin (LIPITOR) 80 MG tablet Take 1 tablet (80 mg total) by mouth daily. 10/15/19  Yes Angiulli, Lavon Paganini, PA-C  dexamethasone (DECADRON) 2 MG tablet 2 tablets twice daily x7 days then 1 tablet twice daily x7 days then 1 tablet daily x7 days and 1 tablet every other day x12 days and stop 10/15/19  Yes Angiulli, Lavon Paganini, PA-C  famotidine (PEPCID) 20 MG tablet Take 1 tablet (20 mg total) by mouth at bedtime.  10/15/19  Yes Angiulli, Lavon Paganini, PA-C  glipiZIDE (GLUCOTROL) 5 MG tablet Take 1 tablet (5 mg total) by mouth daily before breakfast. 10/15/19  Yes Angiulli, Lavon Paganini, PA-C  levETIRAcetam (KEPPRA) 1000 MG tablet Take 1 tablet (1,000 mg total) by mouth 2 (two) times daily. 10/15/19  Yes Angiulli, Lavon Paganini, PA-C  metFORMIN (GLUCOPHAGE) 1000 MG tablet Take 1 tablet (1,000 mg total) by mouth 2 (two) times daily with a meal. 10/15/19  Yes Angiulli, Lavon Paganini, PA-C  mirtazapine (REMERON SOL-TAB) 15 MG disintegrating tablet Take 1 tablet (15 mg total) by mouth at bedtime. 10/15/19  Yes Angiulli, Lavon Paganini, PA-C  Omega-3 Fatty Acids (FISH OIL) 1200 MG CAPS Take 1,200 mg by mouth daily.   Yes [provider]  oxyCODONE (OXY IR/ROXICODONE) 5 MG immediate release tablet Take 1 tablet (5 mg total) by mouth every 6 (six) hours as needed for moderate pain or severe pain. 10/15/19  Yes Angiulli, Lavon Paganini, PA-C  prochlorperazine (COMPAZINE) 10 MG tablet Take 1 tablet (10 mg total) by mouth every 6 (six) hours as needed for nausea or vomiting. 10/30/19  Yes Curt Bears, MD  QUEtiapine (SEROQUEL) 25 MG tablet Take 0.5 tablets (12.5 mg total) by mouth at bedtime. 10/15/19  Yes Angiulli, Lavon Paganini, PA-C  warfarin (COUMADIN) 5 MG tablet Take 1 tablet (5 mg total) by mouth daily. Patient taking differently: Take 5 mg by mouth daily. Do not take from 11/23/19-10/26/19 Had appointment 11/26/19   Last INR 9.2 suppose to be between 2& 3 11/05/19  Yes Josue Hector, MD  calcium carbonate (TUMS - DOSED IN MG ELEMENTAL CALCIUM) 500 MG chewable tablet Chew 1 tablet (200 mg of elemental calcium total) by mouth 2 (two) times daily as needed for indigestion or heartburn. Patient not taking: Reported on 10/30/2019 10/15/19   Angiulli, Lavon Paganini, PA-C  ibuprofen (ADVIL) 200 MG tablet Take 1 tablet (200 mg total) by mouth 2 (two) times daily as needed for headache. Patient not taking: Reported on 10/30/2019 10/15/19   Angiulli, Lavon Paganini,  PA-C  nicotine (NICODERM CQ - DOSED IN MG/24 HOURS) 21 mg/24hr patch 21 mg patch daily x1 week then 14 mg patch daily x3 weeks then 7 mg patch daily x3 weeks and stop Patient not taking: Reported on 10/30/2019 10/15/19   Angiulli, Lavon Paganini, PA-C  senna-docusate (SENOKOT-S) 8.6-50 MG tablet Take 1 tablet by mouth 2 (two) times daily. Patient not taking: Reported on 10/30/2019 10/15/19   Cathlyn Parsons, PA-C    Physical Exam: Vitals:   11/25/19 1530 11/25/19 1600 11/25/19 1700 11/25/19 1748  BP: 98/64 97/65 97/65  107/62  Pulse:  (!) 103 99 95  Resp:  18 18   Temp:    97.7 F (36.5 C)  TempSrc:      SpO2:  94% 94% 96%  Weight:      Height:        Constitutional: NAD, calm,  comfortable Vitals:   11/25/19 1530 11/25/19 1600 11/25/19 1700 11/25/19 1748  BP: 98/64 97/65 97/65  107/62  Pulse:  (!) 103 99 95  Resp:  18 18   Temp:    97.7 F (36.5 C)  TempSrc:      SpO2:  94% 94% 96%  Weight:      Height:       Eyes: PERRL, lids and conjunctivae normal, pale and jaundiced. ENMT: Mucous membranes are dry.  Posterior pharynx clear of any exudate or lesions. Neck: normal, supple, no masses, no thyromegaly Respiratory: Bilateral conducted airway sounds.  Occasional expiratory wheezes.  Normal respiratory effort. No accessory muscle use.  Cardiovascular: Regular rate and rhythm, tachycardic.  No carotid bruits.  Bruised extremities. Abdomen: no tenderness, no masses palpated. Bowel sounds positive.  Musculoskeletal: no clubbing / cyanosis. No joint deformity upper and lower extremities. Good ROM, no contractures. Normal muscle tone.  Skin: no rashes, lesions, ulcers. No induration Neurologic: CN 2-12 grossly intact. Sensation intact, DTR normal. Strength 5/5 in all 4 with generalized weakness. Psychiatric: Normal judgment and insight. Alert and oriented x 3.  Anxious.    Labs on Admission: I have personally reviewed following labs and imaging studies  CBC: Recent Labs  Lab  11/25/19 1302  WBC 5.4  HGB 11.5*  HCT 32.7*  MCV 87.2  PLT 509   Basic Metabolic Panel: Recent Labs  Lab 11/25/19 1302  NA 125*  K 3.6  CL 92*  CO2 21*  GLUCOSE 41*  BUN 27*  CREATININE 1.14  CALCIUM 7.0*  MG 1.9  PHOS 3.6   GFR: Estimated Creatinine Clearance: 51.3 mL/min (by C-G formula based on SCr of 1.14 mg/dL). Liver Function Tests: Recent Labs  Lab 11/25/19 1302  AST 171*  ALT 129*  ALKPHOS 882*  BILITOT 8.4*  PROT 4.3*  ALBUMIN 1.8*   No results for input(s): LIPASE, AMYLASE in the last 168 hours. No results for input(s): AMMONIA in the last 168 hours. Coagulation Profile: Recent Labs  Lab 11/22/19 1438 11/22/19 1449 11/22/19 1557 11/25/19 1313  INR >10.0* 8.0* 9.5* 4.6*   Cardiac Enzymes: No results for input(s): CKTOTAL, CKMB, CKMBINDEX, TROPONINI in the last 168 hours. BNP (last 3 results) No results for input(s): PROBNP in the last 8760 hours. HbA1C: No results for input(s): HGBA1C in the last 72 hours. CBG: Recent Labs  Lab 11/25/19 1447 11/25/19 1629  GLUCAP 22* 162*   Lipid Profile: No results for input(s): CHOL, HDL, LDLCALC, TRIG, CHOLHDL, LDLDIRECT in the last 72 hours. Thyroid Function Tests: No results for input(s): TSH, T4TOTAL, FREET4, T3FREE, THYROIDAB in the last 72 hours. Anemia Panel: No results for input(s): VITAMINB12, FOLATE, FERRITIN, TIBC, IRON, RETICCTPCT in the last 72 hours. Urine analysis:    Component Value Date/Time   COLORURINE YELLOW 10/01/2019 1230   APPEARANCEUR CLEAR 10/01/2019 1230   LABSPEC 1.013 10/01/2019 1230   PHURINE 6.0 10/01/2019 1230   GLUCOSEU NEGATIVE 10/01/2019 1230   HGBUR NEGATIVE 10/01/2019 1230   BILIRUBINUR NEGATIVE 10/01/2019 1230   KETONESUR NEGATIVE 10/01/2019 1230   PROTEINUR NEGATIVE 10/01/2019 1230   NITRITE NEGATIVE 10/01/2019 1230   LEUKOCYTESUR NEGATIVE 10/01/2019 1230    Radiological Exams on Admission: CT Abdomen Pelvis Wo Contrast  Result Date:  11/25/2019 CLINICAL DATA:  Abdominal distention. Dark tarry stools. Metastatic non-small cell lung cancer. EXAM: CT ABDOMEN AND PELVIS WITHOUT CONTRAST TECHNIQUE: Multidetector CT imaging of the abdomen and pelvis was performed following the standard protocol without IV contrast. COMPARISON:  CT  scan dated 09/02/2019 and PET-CT dated 11/12/2019 FINDINGS: Lower chest: Increased loculated moderate right pleural effusion with new extensive infiltrate and/or tumor in the right lower lobe with marked narrowing and obstruction of lower lobe bronchi. New focal area of infiltrate or tumor at the right lung base. Progressive small metastatic pulmonary nodules bilaterally. Heart size is normal. Aortic atherosclerosis. Hepatobiliary: No focal liver abnormality is seen. No gallstones, gallbladder wall thickening, or biliary dilatation. Pancreas: Unremarkable. No pancreatic ductal dilatation or surrounding inflammatory changes. Spleen: Normal in size without focal abnormality. Adrenals/Urinary Tract: Adrenal glands are unremarkable. Kidneys are normal except for an exophytic 2.8 cm cyst on the mid left kidney, unchanged. Bladder is unremarkable. Stomach/Bowel: Small hiatal hernia. The stomach is otherwise normal. Slight edema in the mucosa of the terminal ileum best seen on image 88 of series 2. The small bowel otherwise appears normal. There are a few diverticula in the proximal sigmoid portion of the colon. No dilated colon. The appendix is not discretely identified. Vascular/Lymphatic: Extensive aortic atherosclerosis. Saccular 3.7 cm abdominal aortic aneurysm, essentially unchanged since the prior study. 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 Reproductive: Prostate is unremarkable. Other: No abdominal wall hernia or abnormality. No abdominopelvic ascites. Musculoskeletal: No acute abnormalities. IMPRESSION: 1. Slight edema in the  mucosa of the distal ileum. No other acute abnormality of the abdomen or pelvis. 2. Increased loculated moderate right pleural effusion with new extensive infiltrate and/or tumor in the right lower lobe with marked new narrowing and obstruction of the lower lobe bronchi. 3. Progressive small metastatic pulmonary nodules bilaterally. 4. Stable saccular abdominal aortic aneurysm. Aortic Atherosclerosis (ICD10-I70.0). Electronically Signed   By: Lorriane Shire M.D.   On: 11/25/2019 16:43   DG Chest Port 1 View  Result Date: 11/25/2019 CLINICAL DATA:  Weakness, decreased appetite EXAM: PORTABLE CHEST 1 VIEW COMPARISON:  11/12/2019 FINDINGS: Stable cardiomediastinal contours. Calcific aortic knob. Elevation of the right hemidiaphragm appears new from prior. Streaky right basilar opacity, which could reflect atelectasis. Known bilateral pulmonary nodules are only faintly visible radiographically. The right costophrenic angle was excluded from the field of view. No pneumothorax. IMPRESSION: New elevation of the right hemidiaphragm with streaky right basilar opacity, which could reflect pleural effusion and/or atelectasis. Electronically Signed   By: Davina Poke D.O.   On: 11/25/2019 13:25    EKG: Independently reviewed.  Shows left anterior fascicular block, PACs comparable to previous EKGs.  Assessment/Plan Principal Problem:   Liver failure, acute Active Problems:   Type 2 diabetes mellitus (H[facility])   Adenocarcinoma of right lung, stage 4 ([facility])   Goals of care, counseling/discussion   Focal and partial seizures ([place])   Primary hypercoagulable state ([place])   Hypoglycemia     1.  Acute liver failure with coagulopathy/transaminitis/diarrhea with ileitis: Probably related to progressive cancer, might have been aggravated by [facility] causing mucositis and liver inflammation. Mental status is stable. Continue resuscitation fluid. We will try IV Solu-Medrol 60 mg twice a day, will discuss with his  oncologist. Vitamin K x1 today.  Hold all anticoagulation.  2.  Coagulopathy with hematuria: Hemoglobin is stable.  No evidence of urinary retention. Vitamin K x1.  Discontinue Coumadin.  Monitor hemoglobin and transfuse if needed.  3.  Stage IV adenocarcinoma of the lungs, bilateral pulmonary metastasis, liver metastasis, brain mets: Status post radiation therapy to the brain.  1 dose of Keytruda. Patient has progressive debility, failure to thrive,  unable to eat.  Probably terminal stage. Advance care planning, discussed with patient who was not able to keep up conversation. Patient's wife called, updated about results and probable terminal nature of the disease.  Discussed about palliative care consultation and hospice. At this time, we will continue IV fluids and treatment, will allow comfort care medications if needed. If condition to deteriorate, will start comfort care measures. DNR/DNI with no escalation of care. Consult palliative care, anticipate inpatient hospice if agreed by patient and family. Patient on Keppra for seizure prophylaxis that he will continue.  4.  Hyponatremia/hypochloremia: Overall due to dehydration.  Treated with isotonic fluid, will continue dextrose Ringer lactate and monitor levels.  5.  Type 2 diabetes with hypoglycemia: Patient is fairly asymptomatic given low blood sugars.  Aggressively replaced.  Patient was on sulfonylurea at home, last dose taken 24 hours ago.  Also with acute liver failure. Discontinue all insulin and oral hypoglycemics.  Will keep patient on dextrose infusion, also started on steroids and hopefully that will help.  Customize sliding scale, only cover for blood sugars of more than 250.  DVT prophylaxis: Not needed.  Patient is coagulopathic. Code Status: DNR/DNI Family Communication: Wife on the phone Disposition Plan: Inpatient hospice if agreed Consults called: Oncology notified, palliative care consulted Admission status:  Inpatient.   Barb Merino MD [facility] Pager [phone]

## 2019-11-25 NOTE — ED Notes (Signed)
Brought juice and sandwich to pt. Pt stated that he needed to go to the restroom to urinate and possibly have a BM. Pt brought to restroom via Denna Haggard.

## 2019-11-25 NOTE — ED Notes (Signed)
Date and time results received: 11/25/19 1:50 PM  (use smartphrase ".now" to insert current time)  Test: Glucose Critical Value: 25  Name of Provider Notified: Pfeiffer  Orders Received? Or Actions Taken?: Orders Received - See Orders for details

## 2019-11-25 NOTE — ED Triage Notes (Addendum)
Pt EMS pt worsening dark tarry stools over past week; pt on blood thinners and hx of cancer. Pt verbalizes poor appetite as well.   VS with EMS BP 107/58 HR 94 RR 18 92% on RA oxygen 94% on 2 lpm Altha 63 CBG  Given 250 ml bolus of normal saline en route to hospital.

## 2019-11-26 DIAGNOSIS — E440 Moderate protein-calorie malnutrition: Secondary | ICD-10-CM | POA: Insufficient documentation

## 2019-11-26 LAB — COMPREHENSIVE METABOLIC PANEL
ALT: 115 U/L — ABNORMAL HIGH (ref 0–44)
AST: 113 U/L — ABNORMAL HIGH (ref 15–41)
Albumin: 1.8 g/dL — ABNORMAL LOW (ref 3.5–5.0)
Alkaline Phosphatase: 802 U/L — ABNORMAL HIGH (ref 38–126)
Anion gap: 13 (ref 5–15)
BUN: 30 mg/dL — ABNORMAL HIGH (ref 8–23)
CO2: 20 mmol/L — ABNORMAL LOW (ref 22–32)
Calcium: 7.4 mg/dL — ABNORMAL LOW (ref 8.9–10.3)
Chloride: 91 mmol/L — ABNORMAL LOW (ref 98–111)
Creatinine, Ser: 1.34 mg/dL — ABNORMAL HIGH (ref 0.61–1.24)
GFR calc Af Amer: 59 mL/min — ABNORMAL LOW (ref >60)
GFR calc non Af Amer: 51 mL/min — ABNORMAL LOW (ref >60)
Glucose, Bld: 269 mg/dL — ABNORMAL HIGH (ref 70–99)
Potassium: 3.4 mmol/L — ABNORMAL LOW (ref 3.5–5.1)
Sodium: 124 mmol/L — ABNORMAL LOW (ref 135–145)
Total Bilirubin: 8 mg/dL — ABNORMAL HIGH (ref 0.3–1.2)
Total Protein: 4.6 g/dL — ABNORMAL LOW (ref 6.5–8.1)

## 2019-11-26 LAB — URINALYSIS, ROUTINE W REFLEX MICROSCOPIC
Bilirubin Urine: NEGATIVE
Glucose, UA: NEGATIVE mg/dL
Ketones, ur: NEGATIVE mg/dL
Nitrite: NEGATIVE
Protein, ur: NEGATIVE mg/dL
Specific Gravity, Urine: 1.008 (ref 1.005–1.030)
pH: 5 (ref 5.0–8.0)

## 2019-11-26 LAB — GLUCOSE, CAPILLARY
Glucose-Capillary: 211 mg/dL — ABNORMAL HIGH (ref 70–99)
Glucose-Capillary: 247 mg/dL — ABNORMAL HIGH (ref 70–99)
Glucose-Capillary: 277 mg/dL — ABNORMAL HIGH (ref 70–99)
Glucose-Capillary: 293 mg/dL — ABNORMAL HIGH (ref 70–99)
Glucose-Capillary: 330 mg/dL — ABNORMAL HIGH (ref 70–99)
Glucose-Capillary: 357 mg/dL — ABNORMAL HIGH (ref 70–99)

## 2019-11-26 LAB — CBC
HCT: 31.7 % — ABNORMAL LOW (ref 39.0–52.0)
Hemoglobin: 11.1 g/dL — ABNORMAL LOW (ref 13.0–17.0)
MCH: 30.4 pg (ref 26.0–34.0)
MCHC: 35 g/dL (ref 30.0–36.0)
MCV: 86.8 fL (ref 80.0–100.0)
Platelets: 222 10*3/uL (ref 150–400)
RBC: 3.65 MIL/uL — ABNORMAL LOW (ref 4.22–5.81)
RDW: 16.5 % — ABNORMAL HIGH (ref 11.5–15.5)
WBC: 3.8 10*3/uL — ABNORMAL LOW (ref 4.0–10.5)
nRBC: 0 % (ref 0.0–0.2)

## 2019-11-26 LAB — PROTIME-INR
INR: 1.1 (ref 0.8–1.2)
Prothrombin Time: 13.9 seconds (ref 11.4–15.2)

## 2019-11-26 MED ORDER — INSULIN ASPART 100 UNIT/ML ~~LOC~~ SOLN
0.0000 [IU] | Freq: Three times a day (TID) | SUBCUTANEOUS | Status: DC
  Administered 2019-11-26: 15 [IU] via SUBCUTANEOUS
  Administered 2019-11-27: 5 [IU] via SUBCUTANEOUS
  Administered 2019-11-27: 2 [IU] via SUBCUTANEOUS

## 2019-11-26 MED ORDER — ENSURE ENLIVE PO LIQD: 237.0000 mL | Freq: Two times a day (BID) | ORAL | Status: DC

## 2019-11-26 MED ORDER — PRO-STAT SUGAR FREE PO LIQD
30.0000 mL | Freq: Two times a day (BID) | ORAL | Status: DC
  Administered 2019-11-26 – 2019-11-27 (×3): 30 mL via ORAL
  Filled 2019-11-26 (×3): qty 30

## 2019-11-26 MED ORDER — INSULIN ASPART 100 UNIT/ML ~~LOC~~ SOLN
0.0000 [IU] | Freq: Every day | SUBCUTANEOUS | Status: DC
  Administered 2019-11-26: 3 [IU] via SUBCUTANEOUS

## 2019-11-26 MED ORDER — SODIUM CHLORIDE 0.9% FLUSH: 10.0000 mL | INTRAVENOUS | Status: DC | PRN

## 2019-11-26 MED ORDER — SODIUM CHLORIDE 0.9% FLUSH
10.0000 mL | Freq: Two times a day (BID) | INTRAVENOUS | Status: DC
  Administered 2019-11-26 – 2019-11-27 (×2): 10 mL

## 2019-11-26 MED ORDER — POTASSIUM CHLORIDE CRYS ER 20 MEQ PO TBCR
40.0000 meq | EXTENDED_RELEASE_TABLET | Freq: Two times a day (BID) | ORAL | Status: DC
  Administered 2019-11-26 – 2019-11-27 (×3): 40 meq via ORAL
  Filled 2019-11-26 (×3): qty 2

## 2019-11-26 MED ORDER — PREDNISONE 50 MG PO TABS
60.0000 mg | ORAL_TABLET | Freq: Every day | ORAL | Status: DC
  Administered 2019-11-27: 09:00:00 60 mg via ORAL
  Filled 2019-11-26: qty 1

## 2019-11-26 MED ORDER — LACTATED RINGERS IV SOLN: INTRAVENOUS | Status: DC

## 2019-11-26 MED ORDER — ADULT MULTIVITAMIN W/MINERALS CH
1.0000 | ORAL_TABLET | Freq: Every day | ORAL | Status: DC
  Administered 2019-11-26 – 2019-11-27 (×2): 1 via ORAL
  Filled 2019-11-26 (×2): qty 1

## 2019-11-26 MED ORDER — LIP MEDEX EX OINT
TOPICAL_OINTMENT | CUTANEOUS | Status: DC | PRN
  Filled 2019-11-26: qty 7

## 2019-11-26 NOTE — Plan of Care (Signed)

## 2019-11-26 NOTE — Progress Notes (Signed)
PROGRESS NOTE    Geoffrey Benjamin  KGY:[id number] DOB: 06/29/1941 DOA: 11/25/2019 PCP: Heywood Bene, PA-C    Brief Narrative:  From admission history, Dr. Raelyn Mora  HPI: Geoffrey Benjamin is a 79 y.o. 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 According to the patient, he did not do well since last 3 weeks.  Patient reports multiple episodes of diarrhea, 3-4 loose bowel movements a day, urinating every 1 or 2 hours and many times mixed with blood, no appetite, ongoing nausea.  He thinks he might have eaten few bites many days ago.  Reports vomiting but no hematemesis.  Shortness of breath is gradually worsening.  He cannot sleep. ED Course: Blood pressure stable after 250 mL bolus given in route.  Afebrile.  On room air, 2 L oxygen for comfort.  Hemoglobin is a stable.  Sodium 125, chloride 92, renal functions normal.  INR 4.6.  Glucose was 41, however patient was alert and awake. Liver function test shows acute liver failure with bilirubin more than 8.4, transaminitis with AST 171, ALT 129.  His albumin is 1.8. CT scan of the chest abdomen shows multiple metastatic lesion, edema of the distal ileum.  Loculated right pleural effusion with extensive infiltrate versus tumor burden.  Metastatic pulmonary nodules bilaterally. Patient started on dextrose infusion and admission requested.   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  Acute liver failure with transaminitis: Suspected due to [facility]. Stabilizing. Started on high-dose Solu-Medrol, will change to prednisone 1 mg/kg/day.  Patient will need long-term steroid therapy and gradual taper.  Notified patient's oncologist. INR normalized.  No evidence of encephalopathy. Continue steroids and checking LFTs.  #2. coagulopathy with hematuria: Patient on Coumadin for chronic systolic heart failure, severe aortic stenosis.  Reversed with vitamin K.  Currently hemodynamically stable.  INR normalized.  We will keep patient off Coumadin.  #3. type 2 diabetes with hypoglycemia: Due to poor oral intake.  Treated with dextrose infusion with improvement.  Discontinue dextrose.  Will still keep on customize sliding scale and monitor.  Allow regular diet.  May not need any treatment.  # 4.  Hypochloremic hyponatremia: Due to overall poor oral intake.  Will start patient on isotonic fluid and slowly correct.  # 5.  Stage IV adenocarcinoma of the lungs with metastasis: Received first dose of Keytruda on 2/3.  Probably had severe reaction to it.  Patient is status post brain radiation. Further management as per oncology.  However, currently not ready for another dose of Keytruda. We will consult palliative care to coordinate care and introduce hospice care.   DVT prophylaxis: SCDs Code Status: DNR Family Communication: Wife at the bedside Disposition Plan: patient is from home. Anticipated DC to unknown, Barriers to discharge on active treatment including IV fluids, steroids, monitoring of liver functions   Consultants:   Oncology, notified  Palliative medicine  Procedures:   None  Antimicrobials:   None   Subjective: Patient seen and examined.  No overnight events.  His diarrhea improved.  Nausea vomiting improved.  After so many days he was able to eat some breakfast and it really tasted well. Wife at the bedside.  We discussed  about abnormal test results, liver failure. Patient states that the treatment today are helping and making him comfortable and he wants to continue to do that.  Objective: Vitals:   11/26/19 0446 11/26/19 0448 11/26/19 0500 11/26/19 0803  BP: (!) 90/53   91/60  Pulse: 99 98  96  Resp: 18     Temp: 97.8 F (36.6 C)   98.4 F (36.9 C)  TempSrc: Oral   Oral  SpO2: (!) 89% 92%  94%  Weight:   66.6 kg   Height:        Intake/Output Summary (Last 24 hours) at 11/26/2019 1143 Last data filed at 11/26/2019 1008 Gross per 24 hour  Intake 854.55 ml  Output 670 ml  Net 184.55 ml   Filed Weights   11/25/19 1240 11/26/19 0500  Weight: 65.8 kg 66.6 kg    Examination:  General exam: Appears calm and comfortable on room air.  Chronically sick looking however today he is comfortable and pleasant. Respiratory system: Some conducted airway sounds. Cardiovascular system: S1 & S2 heard, RRR. No pedal edema. Gastrointestinal system: Abdomen is nondistended, soft and nontender. No organomegaly or masses felt. Normal bowel sounds heard. Central nervous system: Alert and oriented. No focal neurological deficits.  Generalized weakness. Extremities: Symmetric 5 x 5 power. Skin: No rashes, lesions or ulcers Psychiatry: Judgement and insight appear normal. Mood & affect appropriate.     Data Reviewed: I have personally reviewed following labs and imaging studies  CBC: Recent Labs  Lab 11/25/19 1302 11/26/19 0452  WBC 5.4 3.8*  HGB 11.5* 11.1*  HCT 32.7* 31.7*  MCV 87.2 86.8  PLT 222 366   Basic Metabolic Panel: Recent Labs  Lab 11/25/19 1302 11/26/19 0452  NA 125* 124*  K 3.6 3.4*  CL 92* 91*  CO2 21* 20*  GLUCOSE 41* 269*  BUN 27* 30*  CREATININE 1.14 1.34*  CALCIUM 7.0* 7.4*  MG 1.9  --   PHOS 3.6  --    GFR: Estimated Creatinine Clearance: 44.2 mL/min (A) (by C-G formula based on SCr of 1.34 mg/dL (H)). Liver Function Tests: Recent Labs  Lab 11/25/19 1302  11/26/19 0452  AST 171* 113*  ALT 129* 115*  ALKPHOS 882* 802*  BILITOT 8.4* 8.0*  PROT 4.3* 4.6*  ALBUMIN 1.8* 1.8*   No results for input(s): LIPASE, AMYLASE in the last 168 hours. Recent Labs  Lab 11/25/19 1758  AMMONIA 35   Coagulation Profile: Recent Labs  Lab 11/22/19 1438 11/22/19 1449 11/22/19 1557 11/25/19 1313 11/26/19 0452  INR >10.0* 8.0* 9.5* 4.6* 1.1   Cardiac Enzymes: No results for input(s): CKTOTAL, CKMB, CKMBINDEX, TROPONINI in the last 168 hours. BNP (last 3 results) No results for input(s): PROBNP in the last 8760 hours. HbA1C: No results for input(s): HGBA1C in the last 72 hours. CBG: Recent Labs  Lab 11/25/19 1629 11/25/19 2017 11/26/19 0010 11/26/19 0401 11/26/19 0739  GLUCAP 162* 147* 211* 247* 293*   Lipid Profile: No results for input(s): CHOL, HDL, LDLCALC, TRIG, CHOLHDL, LDLDIRECT in the last 72 hours. Thyroid Function Tests: No results for input(s): TSH, T4TOTAL, FREET4, T3FREE, THYROIDAB in the last 72 hours. Anemia Panel: No results for input(s): VITAMINB12, FOLATE, FERRITIN, TIBC,  IRON, RETICCTPCT in the last 72 hours. Sepsis Labs: Recent Labs  Lab 11/25/19 1313  LATICACIDVEN 0.8    Recent Results (from the past 240 hour(s))  Respiratory Panel by RT PCR (Flu A&B, Covid) - Nasopharyngeal Swab     Status: None   Collection Time: 11/25/19  2:10 PM   Specimen: Nasopharyngeal Swab  Result Value Ref Range Status   SARS Coronavirus 2 by RT PCR NEGATIVE NEGATIVE Final    Comment: (NOTE) SARS-CoV-2 target nucleic acids are NOT DETECTED. The SARS-CoV-2 RNA is generally detectable in upper respiratoy specimens during the acute phase of infection. The lowest concentration of SARS-CoV-2 viral copies this assay can detect is 131 copies/mL. A negative result does not preclude SARS-Cov-2 infection and should not be used as the sole basis for treatment or other patient management decisions. A negative result may occur with  improper  specimen collection/handling, submission of specimen other than nasopharyngeal swab, presence of viral mutation(s) within the areas targeted by this assay, and inadequate number of viral copies (<131 copies/mL). A negative result must be combined with clinical observations, patient history, and epidemiological information. The expected result is Negative. Fact Sheet for Patients:  PinkCheek.be Fact Sheet for Healthcare Providers:  GravelBags.it This test is not yet ap proved or cleared by the [place] FDA and  has been authorized for detection and/or diagnosis of SARS-CoV-2 by FDA under an Emergency Use Authorization (EUA). This EUA will remain  in effect (meaning this test can be used) for the duration of the COVID-19 declaration under Section 564(b)(1) of the Act, 21 U.S.C. section 360bbb-3(b)(1), unless the authorization is terminated or revoked sooner.    Influenza A by PCR NEGATIVE NEGATIVE Final   Influenza B by PCR NEGATIVE NEGATIVE Final    Comment: (NOTE) The Xpert Xpress SARS-CoV-2/FLU/RSV assay is intended as an aid in  the diagnosis of influenza from Nasopharyngeal swab specimens and  should not be used as a sole basis for treatment. Nasal washings and  aspirates are unacceptable for Xpert Xpress SARS-CoV-2/FLU/RSV  testing. Fact Sheet for Patients: PinkCheek.be Fact Sheet for Healthcare Providers: GravelBags.it This test is not yet approved or cleared by the [place] FDA and  has been authorized for detection and/or diagnosis of SARS-CoV-2 by  FDA under an Emergency Use Authorization (EUA). This EUA will remain  in effect (meaning this test can be used) for the duration of the  Covid-19 declaration under Section 564(b)(1) of the Act, 21  U.S.C. section 360bbb-3(b)(1), unless the authorization is  terminated or revoked. Performed at [facility], [place] [address], [place], [place] [address]          Radiology Studies: CT Abdomen Pelvis Wo Contrast  Result Date: 11/25/2019 CLINICAL DATA:  Abdominal distention. Dark tarry stools. Metastatic non-small cell lung cancer. EXAM: CT ABDOMEN AND PELVIS WITHOUT CONTRAST TECHNIQUE: Multidetector CT imaging of the abdomen and pelvis was performed following the standard protocol without IV contrast. COMPARISON:  CT scan dated 09/02/2019 and PET-CT dated 11/12/2019 FINDINGS: Lower chest: Increased loculated moderate right pleural effusion with new extensive infiltrate and/or tumor in the right lower lobe with marked narrowing and obstruction of lower lobe bronchi. New focal area of infiltrate or tumor at the right lung base. Progressive small metastatic pulmonary nodules bilaterally. Heart size is normal. Aortic atherosclerosis. Hepatobiliary: No focal liver abnormality is seen. No gallstones, gallbladder wall thickening, or biliary dilatation. Pancreas: Unremarkable. No pancreatic ductal dilatation or surrounding inflammatory changes. Spleen: Normal in size without  focal abnormality. Adrenals/Urinary Tract: Adrenal glands are unremarkable. Kidneys are normal except for an exophytic 2.8 cm cyst on the mid left kidney, unchanged. Bladder is unremarkable. Stomach/Bowel: Small hiatal hernia. The stomach is otherwise normal. Slight edema in the mucosa of the terminal ileum best seen on image 88 of series 2. The small bowel otherwise appears normal. There are a few diverticula in the proximal sigmoid portion of the colon. No dilated colon. The appendix is not discretely identified. Vascular/Lymphatic: Extensive aortic atherosclerosis. Saccular 3.7 cm abdominal aortic aneurysm, essentially unchanged since the prior study. 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 Reproductive: Prostate is unremarkable.  Other: No abdominal wall hernia or abnormality. No abdominopelvic ascites. Musculoskeletal: No acute abnormalities. IMPRESSION: 1. Slight edema in the mucosa of the distal ileum. No other acute abnormality of the abdomen or pelvis. 2. Increased loculated moderate right pleural effusion with new extensive infiltrate and/or tumor in the right lower lobe with marked new narrowing and obstruction of the lower lobe bronchi. 3. Progressive small metastatic pulmonary nodules bilaterally. 4. Stable saccular abdominal aortic aneurysm. Aortic Atherosclerosis (ICD10-I70.0). Electronically Signed   By: Lorriane Shire M.D.   On: 11/25/2019 16:43   DG Chest Port 1 View  Result Date: 11/25/2019 CLINICAL DATA:  Weakness, decreased appetite EXAM: PORTABLE CHEST 1 VIEW COMPARISON:  11/12/2019 FINDINGS: Stable cardiomediastinal contours. Calcific aortic knob. Elevation of the right hemidiaphragm appears new from prior. Streaky right basilar opacity, which could reflect atelectasis. Known bilateral pulmonary nodules are only faintly visible radiographically. The right costophrenic angle was excluded from the field of view. No pneumothorax. IMPRESSION: New elevation of the right hemidiaphragm with streaky right basilar opacity, which could reflect pleural effusion and/or atelectasis. Electronically Signed   By: Davina Poke D.O.   On: 11/25/2019 13:25        Scheduled Meds: . insulin aspart  0-6 Units Subcutaneous TID WC  . levETIRAcetam  1,000 mg Oral BID  . [START ON 11/27/2019] predniSONE  60 mg Oral Q breakfast  . QUEtiapine  12.5 mg Oral QHS  . sodium chloride flush  10-40 mL Intracatheter Q12H   Continuous Infusions: . lactated ringers 75 mL/hr at 11/26/19 1043     LOS: 1 day    Time spent: 30 minutes    Barb Merino, MD [facility] Pager [phone]

## 2019-11-26 NOTE — Progress Notes (Signed)
Initial Nutrition Assessment  DOCUMENTATION CODES:   Non-severe (moderate) malnutrition in context of chronic illness  INTERVENTION:  - will order Ensure Enlive BID, each supplement provides 350 kcal and 20 grams of protein. - will order Magic Cup with dinner meals, each supplement provides 290 kcal and 9 grams of protein. - will order 30 mL Prostat BID, each supplement provides 100 kcal and 15 grams of protein. - will order daily multivitamin with minerals.    NUTRITION DIAGNOSIS:   Moderate Malnutrition related to chronic illness, cancer and cancer related treatments as evidenced by mild fat depletion, mild muscle depletion.  GOAL:   Patient will meet greater than or equal to 90% of their needs  MONITOR:   PO intake, Supplement acceptance, Labs, Weight trends  REASON FOR ASSESSMENT:   Malnutrition Screening Tool  ASSESSMENT:   80 y.o. male with medical history of seizure disorder and stage IV lung cancer (dx 08/2019) with metastasis to liver, bilateral lungs, started on palliative immunotherapy with Keytruda (first dose: 11/06/19), s/p a course of brain radiation. He presented to the ED d/t feeling extremely weak, overall unwell/sick, poor appetite, and diarrhea for >1 week. Patient reports feeling unwell x3 weeks with 3-4 loose BMs/day, urinating every 1-2 hours, persistent nausea, periods of emesis, and eating poorly. He also reported progressive SOB.  No intakes documented. Nursing staff providing meds and care during the first half of RD visit. Wife is at bedside so obtained PTA information from her before completing NFPE and then helping patient get set up with lunch tray.   Wife's responses to questions were fairly short in nature so it was difficult to obtain much detail. Patient did have decreased appetite over the past 3 weeks but was eating 2-3 meals and some snacks each day; unable to determine if portion sizes were smaller/how much patient truly ate.   Patient reports  no abdominal discomfort or nausea today. Wife denies him having any vomiting or abdominal pressure PTA but indicates he was having nausea; unsure if this is what led to decreased appetite and intakes. She states that he ate 100% of breakfast this AM (1061 kcal, 33 grams protein).   Wife denies patient having any difficulties with chewing or swallowing. She confirms he has lost weight in the past few weeks but is unsure of the amount lost. Per chart review, weight today is 147 lb and weight on 2/3 was 150 lb. This indicates 3 lb weight loss (2% body weight); no significant for time frame. Weight on 10/15/19 was 154 lb. This indicates 7 lb weight loss (4.5% body weight) in 5 weeks; not significant.   Per notes: - acute liver failure thought to be 2/2 progressive cancer and possibly aggravated by Keytruda - hematuria - stage 4 adenocarcinoma of the lungs with bilateral lung involvement, liver mets, brain mets--progressive debility and FTT; thought to be terminal - DNR/DNI with Palliative Care consulted    Labs reviewed; CBGs: 211, 247, 293, and 330 mg/dl, Na: 124 mmol/l, K: 3.4 mmol/l, Cl: 91 mmol/l, BUN: 30 mg/dl, creatinine: 1.34 mg/dl, Ca: 7.4 mg/dl, Alk Phos and LFTs elevated but trending down from 2/22, GFR: 51 ml/min. Medications reviewed; sliding scale novolog, 5 mg IV vitamin K x1 dose 2/22, 40 mEq Klor-Con BID, 60 mg deltasone/day. IVF; LR @ 75 ml/hr.     NUTRITION - FOCUSED PHYSICAL EXAM:    Most Recent Value  Orbital Region  Mild depletion  Upper Arm Region  Mild depletion  Thoracic and Lumbar Region  Unable to assess  Buccal Region  No depletion  Temple Region  No depletion  Clavicle Bone Region  Moderate depletion  Clavicle and Acromion Bone Region  Moderate depletion  Scapular Bone Region  Mild depletion  Dorsal Hand  No depletion  Patellar Region  No depletion  Anterior Thigh Region  Mild depletion  Posterior Calf Region  Mild depletion  Edema (RD Assessment)  None  Hair   Reviewed  Eyes  Reviewed  Mouth  Reviewed  Skin  Reviewed  Nails  Reviewed       Diet Order:   Diet Order            Diet regular Room service appropriate? Yes; Fluid consistency: Thin  Diet effective now              EDUCATION NEEDS:   No education needs have been identified at this time  Skin:  Skin Assessment: Skin Integrity Issues: Skin Integrity Issues:: Other (Comment) Other: R arm wound  Last BM:  2/22  Height:   Ht Readings from Last 1 Encounters:  11/25/19 6' (1.829 m)    Weight:   Wt Readings from Last 1 Encounters:  11/26/19 66.6 kg    Ideal Body Weight:  80.9 kg  BMI:  Body mass index is 19.91 kg/m.  Estimated Nutritional Needs:   Kcal:  2000-2260 kcal  Protein:  100-115 grams  Fluid:  >/= 2.2 L/day     Jarome Matin, MS, RD, LDN, CNSC Inpatient Clinical Dietitian RD pager # available in [facility]  After hours/weekend pager # available in [facility]

## 2019-11-26 NOTE — Progress Notes (Signed)
Pt stable at this time with no needs. No changes in overall pt condition. Assessment performed. Agree with previous nurse assessment. No s/s of distress. Pt denies pain. Rn will continue to monitor.

## 2019-11-27 ENCOUNTER — Telehealth: Payer: Self-pay | Admitting: Internal Medicine

## 2019-11-27 ENCOUNTER — Inpatient Hospital Stay: Payer: Medicare Other

## 2019-11-27 ENCOUNTER — Inpatient Hospital Stay: Payer: Medicare Other | Admitting: Nutrition

## 2019-11-27 ENCOUNTER — Inpatient Hospital Stay: Payer: Medicare Other | Admitting: Physician Assistant

## 2019-11-27 DIAGNOSIS — Z515 Encounter for palliative care: Secondary | ICD-10-CM

## 2019-11-27 DIAGNOSIS — D6859 Other primary thrombophilia: Secondary | ICD-10-CM

## 2019-11-27 DIAGNOSIS — E119 Type 2 diabetes mellitus without complications: Secondary | ICD-10-CM

## 2019-11-27 DIAGNOSIS — Z7189 Other specified counseling: Secondary | ICD-10-CM

## 2019-11-27 DIAGNOSIS — E440 Moderate protein-calorie malnutrition: Secondary | ICD-10-CM

## 2019-11-27 LAB — CBC WITH DIFFERENTIAL/PLATELET
Abs Immature Granulocytes: 0.05 10*3/uL (ref 0.00–0.07)
Basophils Absolute: 0 10*3/uL (ref 0.0–0.1)
Basophils Relative: 0 %
Eosinophils Absolute: 0 10*3/uL (ref 0.0–0.5)
Eosinophils Relative: 0 %
HCT: 32.5 % — ABNORMAL LOW (ref 39.0–52.0)
Hemoglobin: 11.1 g/dL — ABNORMAL LOW (ref 13.0–17.0)
Immature Granulocytes: 1 %
Lymphocytes Relative: 16 %
Lymphs Abs: 1.4 10*3/uL (ref 0.7–4.0)
MCH: 29.8 pg (ref 26.0–34.0)
MCHC: 34.2 g/dL (ref 30.0–36.0)
MCV: 87.4 fL (ref 80.0–100.0)
Monocytes Absolute: 1.3 10*3/uL — ABNORMAL HIGH (ref 0.1–1.0)
Monocytes Relative: 16 %
Neutro Abs: 5.6 10*3/uL (ref 1.7–7.7)
Neutrophils Relative %: 67 %
Platelets: 258 10*3/uL (ref 150–400)
RBC: 3.72 MIL/uL — ABNORMAL LOW (ref 4.22–5.81)
RDW: 16.8 % — ABNORMAL HIGH (ref 11.5–15.5)
WBC: 8.4 10*3/uL (ref 4.0–10.5)
nRBC: 0 % (ref 0.0–0.2)

## 2019-11-27 LAB — COMPREHENSIVE METABOLIC PANEL
ALT: 177 U/L — ABNORMAL HIGH (ref 0–44)
AST: 209 U/L — ABNORMAL HIGH (ref 15–41)
Albumin: 2 g/dL — ABNORMAL LOW (ref 3.5–5.0)
Alkaline Phosphatase: 969 U/L — ABNORMAL HIGH (ref 38–126)
Anion gap: 8 (ref 5–15)
BUN: 33 mg/dL — ABNORMAL HIGH (ref 8–23)
CO2: 24 mmol/L (ref 22–32)
Calcium: 7.7 mg/dL — ABNORMAL LOW (ref 8.9–10.3)
Chloride: 95 mmol/L — ABNORMAL LOW (ref 98–111)
Creatinine, Ser: 1.39 mg/dL — ABNORMAL HIGH (ref 0.61–1.24)
GFR calc Af Amer: 57 mL/min — ABNORMAL LOW (ref >60)
GFR calc non Af Amer: 49 mL/min — ABNORMAL LOW (ref >60)
Glucose, Bld: 142 mg/dL — ABNORMAL HIGH (ref 70–99)
Potassium: 4.2 mmol/L (ref 3.5–5.1)
Sodium: 127 mmol/L — ABNORMAL LOW (ref 135–145)
Total Bilirubin: 7.8 mg/dL — ABNORMAL HIGH (ref 0.3–1.2)
Total Protein: 4.9 g/dL — ABNORMAL LOW (ref 6.5–8.1)

## 2019-11-27 LAB — GLUCOSE, CAPILLARY
Glucose-Capillary: 141 mg/dL — ABNORMAL HIGH (ref 70–99)
Glucose-Capillary: 241 mg/dL — ABNORMAL HIGH (ref 70–99)

## 2019-11-27 LAB — MAGNESIUM: Magnesium: 2 mg/dL (ref 1.7–2.4)

## 2019-11-27 LAB — PHOSPHORUS: Phosphorus: 3.3 mg/dL (ref 2.5–4.6)

## 2019-11-27 MED ORDER — HALOPERIDOL LACTATE 2 MG/ML PO CONC
1.0000 mg | Freq: Four times a day (QID) | ORAL | Status: DC | PRN
  Filled 2019-11-27: qty 0.5

## 2019-11-27 MED ORDER — LORAZEPAM 0.5 MG PO TABS: 0.5000 mg | ORAL_TABLET | ORAL | Status: DC | PRN

## 2019-11-27 MED ORDER — LOPERAMIDE HCL 2 MG PO CAPS
2.0000 mg | ORAL_CAPSULE | Freq: Once | ORAL | Status: AC
  Administered 2019-11-27: 2 mg via ORAL
  Filled 2019-11-27: qty 1

## 2019-11-27 NOTE — Progress Notes (Signed)
Pt is being discharged home today. Discharge instructions including medications and follow up appointments included. Pt had no further questions at this time.

## 2019-11-27 NOTE — TOC Initial Note (Signed)
Transition of Care [facility]) - Initial/Assessment Note    Patient Details  Name: Geoffrey Benjamin MRN: [id number] Date of Birth: 1941-06-15  Transition [facility] [place]) CM/SW Contact:    Trish Mage, LCSW Phone Number: 11/27/2019, 1:39 PM  Clinical Narrative:   Received call from Palliative stating patient and wife want hospice in the home services, specifically [facility].  Called [facility] to make the referral.  TOC will continue to follow during the course of hospitalization.                 Expected Discharge Plan: Home w Hospice Care Barriers to Discharge: No Barriers Identified   Patient Goals and CMS Choice Patient states their goals for this hospitalization and ongoing recovery are:: "I want to go home today."      Expected Discharge Plan and Services Expected Discharge Plan: [place] arrangements for the past 2 months: Single Family Home                                      Prior Living Arrangements/Services Living arrangements for the past 2 months: Single Family Home Lives with:: Spouse                   Activities of Daily Living Home Assistive Devices/Equipment: CBG Meter ADL Screening (condition at time of admission) Patient's cognitive ability adequate to safely complete daily activities?: Yes Is the patient deaf or have difficulty hearing?: No Does the patient have difficulty seeing, even when wearing glasses/contacts?: No Does the patient have difficulty concentrating, remembering, or making decisions?: No Patient able to express need for assistance with ADLs?: Yes Does the patient have difficulty dressing or bathing?: No Independently performs ADLs?: Yes (appropriate for developmental age) Communication: Independent Dressing (OT): Needs assistance Is this a change from baseline?: Pre-admission baseline Grooming: Needs assistance Is this a change from baseline?: Pre-admission baseline Feeding: Needs assistance Is  this a change from baseline?: Pre-admission baseline Bathing: Needs assistance Is this a change from baseline?: Pre-admission baseline Toileting: Needs assistance Is this a change from baseline?: Pre-admission baseline In/Out Bed: Needs assistance Is this a change from baseline?: Pre-admission baseline Walks in Home: Needs assistance Is this a change from baseline?: Pre-admission baseline Does the patient have difficulty walking or climbing stairs?: No Weakness of Legs: Both Weakness of Arms/Hands: Both  Permission Sought/Granted                  Emotional Assessment              Admission diagnosis:  Polyuria [R35.8] Liver failure, acute [K72.00] Hypoglycemia [E16.2] Primary malignant neoplasm of lung metastatic to other site, unspecified laterality ([place]) [C34.90] Liver failure without hepatic coma, unspecified chronicity ([place]) [K72.90] Patient Active Problem List   Diagnosis Date Noted  . Malnutrition of moderate degree 11/26/2019  . Liver failure, acute 11/25/2019  . Hypoglycemia 11/25/2019  . Encounter for therapeutic drug monitoring 11/14/2019  . CVA (cerebral vascular accident) ([place]) 11/14/2019  . Primary hypercoagulable state ([place]) 11/14/2019  . Encounter for antineoplastic immunotherapy 10/30/2019  . Pressure injury of skin 10/16/2019  . Aortic atherosclerosis ([place]) 10/07/2019  . Emphysema of lung ([place]) 10/07/2019  . Metastatic adenocarcinoma ([place]) 10/07/2019  . Focal and partial seizures ([place]) 10/01/2019  . Status epilepticus ([place]) 10/01/2019  . Adenocarcinoma of right lung, stage 4 ([place]) 09/05/2019  .  Goals of care, counseling/discussion 09/05/2019  . Brain metastases ([place]) 09/02/2019  . Right sided weakness 09/01/2019  . Acute systolic congestive heart failure ([place]) 09/19/2018  . CAP (community acquired pneumonia) 09/13/2018  . Hyperlipidemia 09/13/2018  . Volume overload 09/13/2018  . Tobacco use 09/13/2018  . Aortic stenosis 09/13/2018  .  Chronic pain 11/10/2015  . Hypertension 11/10/2015  . Tobacco dependence 11/10/2015  . GERD 06/04/2009  . PERSONAL HX COLONIC POLYPS 06/04/2009  . LOSS OF WEIGHT 09/29/2008  . RLQ PAIN 09/29/2008  . Type 2 diabetes mellitus ([place]) 09/22/2008   PCP:  Heywood Bene, PA-C Pharmacy:   [facility] ([place]), [place] - [address] [place] ([place]) [place] [address] Phone: [phone] Fax: [phone]     Social Determinants of Health (SDOH) Interventions    Readmission Risk Interventions Readmission Risk Prevention Plan 11/27/2019  Transportation Screening Complete  Medication Review ([place]) Complete  PCP or Specialist appointment within 3-5 days of discharge Complete  HRI or [place] Complete  SW Recovery Care/Counseling Consult Complete  Palliative Care Screening Complete  [place] Not Applicable  Some recent data might be hidden

## 2019-11-27 NOTE — Discharge Summary (Signed)
Physician Discharge Summary  Geoffrey Benjamin SPQ:[id number] DOB: April 28, 1941 DOA: 11/25/2019  PCP: Heywood Bene, PA-C  Admit date: 11/25/2019 Discharge date: 11/27/2019  Admitted From: Home  Discharge disposition: Home with hospice   Recommendations for Outpatient Follow-Up:   . 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  Carbohydrate-modified.   Wound care: None.  Code status: DNR   History of Present Illness:   Geoffrey Benjamin a 79 y.o.malewith medical history significant ofstage IV lung cancer with metastasis to liver, bilateral lungs, brain, seizure disorder, diagnosed with cancer in November 2020, started on palliative immunotherapy with Beryle Flock, first dose on November 06, 2019 after a course of brain radiation presented to the emergency room with feeling extremely weak, sick, not eating, diarrhea for more than 1 week. According to the patient, he did not do well since for 3 weeks. Patient reported multiple episodes of diarrhea, 3-4 loose bowel movements a day, urinating every 1 or 2 hours and many times mixed with blood, no appetite, ongoing nausea.   In the ED,blood pressure stable after 250 mL bolus given in route. Afebrile. On room air, 2 L oxygen for comfort. Hemoglobin was stable. Sodium 125, chloride 92, renal functions normal. INR 4.6. Glucose was 41, however patient was alert and awake. Liver function test shows elevated bilirubin more than 8.4, transaminitis with AST 171, ALT 129. His albumin was 1.8. CT scan of the chest abdomen showed multiple  metastatic lesion, edema of the distal ileum. Loculated right pleural effusion with extensive infiltrate versus tumor burden. Metastatic pulmonary nodules bilaterally. Patient was started on dextrose infusion and was admitted to the hospital.   Hospital Course:   Following conditions were addressed during hospitalization as listed below,  Elevated bilirubin/transaminitis: Suspected due to [facility].  Patient received Solu-Medrol followed by prednisone.   No evidence of encephalopathy.  Currently being considered for hospice care at home.  Will hold statins on discharge.  Coagulopathy with hematuria: Patient is on Coumadin as outpatient.  Vitamin K was given and it was reversed.  Patient on Coumadin for chronic systolic heart failure, severe aortic stenosis.  Reversed with vitamin K.  Currently hemodynamically stable.  INR normalized.    Discontinue Coumadin on discharge.  Type 2 diabetes with hypoglycemia: Due to poor oral intake.  Treated with dextrose infusion with improvement.  Will discontinue glipizide on discharge.  Hypochloremic hyponatremia: Due to overall poor oral intake.  Received isotonic fluid during hospitalization  History of seizures.  Continue Keppra  Stage IV adenocarcinoma of the lungs with metastasis: Received first dose of Keytruda on 2/3. status post brain radiation.  Palliative care on board and at this time patient and the family wishes to go home with hospice care at home.  Disposition.  At this time, patient is stable for disposition home with hospice care.  Spoke with Dr. Domingo Cocking, palliative care.  Further care and management as per hospice.  Spoke with the patient's spouse on the phone.  Medical Consultants:    Palliative care  Procedures:    None Subjective:   Today, patient strongly wishes  to go home with hospice.  Has generalized fatigue weakness.  States that he did have a few bowel movements.  Complains of generalized body pain.  Discharge Exam:    Vitals:   11/26/19 2024 11/27/19 0447  BP: (!) 102/51 104/71  Pulse: 88 (!) 107  Resp: 20 20  Temp: (!) 97.3 F (36.3 C) (!) 97.3 F (36.3 C)  SpO2: 96% 100%   Vitals:   11/26/19 1944 11/26/19 2024 11/27/19 0441 11/27/19 0447  BP:  (!) 102/51  104/71  Pulse:  88  (!) 107  Resp:  20  20  Temp:  (!) 97.3 F (36.3 C)  (!) 97.3 F (36.3 C)  TempSrc:      SpO2: 93% 96%  100%  Weight:   70.3 kg   Height:        General: Alert awake, not in obvious distress, ill and deconditioned, thinly built, on nasal cannula oxygen HENT: pupils equally reacting to light,  No scleral pallor or icterus noted. Oral mucosa is mildly dry Chest:   Diminished breath sounds bilaterally. No crackles or wheezes.  CVS: S1 &S2 heard. No murmur.  Regular rate and rhythm. Abdomen: Soft, nontender, nondistended.  Bowel sounds are heard.   Extremities: No cyanosis, clubbing or edema.  Peripheral pulses are palpable. Psych: Alert, awake and oriented CNS:  No cranial nerve deficits.  Power equal in all extremities but generalized weakness noted Skin: Warm and dry.  Diminished skin turgor, no rashes noted.  The results of significant diagnostics from this hospitalization (including imaging, microbiology, ancillary and laboratory) are listed below for reference.     Diagnostic Studies:   CT Abdomen Pelvis Wo Contrast  Result Date: 11/25/2019 CLINICAL DATA:  Abdominal distention. Dark tarry stools. Metastatic non-small cell lung cancer. EXAM: CT ABDOMEN AND PELVIS WITHOUT CONTRAST TECHNIQUE: Multidetector CT imaging of the abdomen and pelvis was performed following the standard protocol without IV contrast. COMPARISON:  CT scan dated 09/02/2019 and PET-CT dated 11/12/2019 FINDINGS: Lower chest: Increased loculated moderate right pleural effusion with new extensive infiltrate and/or tumor in the right lower lobe with marked narrowing and obstruction of lower lobe bronchi. New focal area of infiltrate or tumor at  the right lung base. Progressive small metastatic pulmonary nodules bilaterally. Heart size is normal. Aortic atherosclerosis. Hepatobiliary: No focal liver abnormality is seen. No gallstones, gallbladder wall thickening, or biliary dilatation. Pancreas: Unremarkable. No pancreatic ductal dilatation or surrounding inflammatory changes. Spleen: Normal in size without focal abnormality. Adrenals/Urinary Tract: Adrenal glands are unremarkable. Kidneys are normal except for an exophytic 2.8 cm cyst on the mid left kidney, unchanged. Bladder is unremarkable. Stomach/Bowel: Small hiatal hernia. The stomach is otherwise normal. Slight edema in the mucosa of the terminal ileum best seen on image 88 of series 2. The small bowel otherwise appears normal. There are a few diverticula in the proximal sigmoid portion of the colon. No dilated colon. The appendix is not discretely identified. Vascular/Lymphatic: Extensive aortic atherosclerosis. Saccular 3.7 cm abdominal aortic aneurysm, essentially unchanged since the prior study. 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 Reproductive: Prostate is unremarkable. Other: No abdominal wall hernia or abnormality. No abdominopelvic ascites. Musculoskeletal: No acute abnormalities. IMPRESSION: 1. Slight edema in the mucosa of the distal ileum. No other acute abnormality of the abdomen or pelvis. 2. Increased loculated moderate right pleural effusion with new extensive infiltrate and/or tumor in the right lower lobe with  marked new narrowing and obstruction of the lower lobe bronchi. 3. Progressive small metastatic pulmonary nodules bilaterally. 4. Stable saccular abdominal aortic aneurysm. Aortic Atherosclerosis (ICD10-I70.0). Electronically Signed   By: Lorriane Shire M.D.   On: 11/25/2019 16:43   DG Chest Port 1 View  Result Date: 11/25/2019 CLINICAL DATA:  Weakness, decreased appetite  EXAM: PORTABLE CHEST 1 VIEW COMPARISON:  11/12/2019 FINDINGS: Stable cardiomediastinal contours. Calcific aortic knob. Elevation of the right hemidiaphragm appears new from prior. Streaky right basilar opacity, which could reflect atelectasis. Known bilateral pulmonary nodules are only faintly visible radiographically. The right costophrenic angle was excluded from the field of view. No pneumothorax. IMPRESSION: New elevation of the right hemidiaphragm with streaky right basilar opacity, which could reflect pleural effusion and/or atelectasis. Electronically Signed   By: Davina Poke D.O.   On: 11/25/2019 13:25     Labs:   Basic Metabolic Panel: Recent Labs  Lab 11/25/19 1302 11/25/19 1302 11/26/19 0452 11/27/19 0509  NA 125*  --  124* 127*  K 3.6   < > 3.4* 4.2  CL 92*  --  91* 95*  CO2 21*  --  20* 24  GLUCOSE 41*  --  269* 142*  BUN 27*  --  30* 33*  CREATININE 1.14  --  1.34* 1.39*  CALCIUM 7.0*  --  7.4* 7.7*  MG 1.9  --   --  2.0  PHOS 3.6  --   --  3.3   < > = values in this interval not displayed.   GFR Estimated Creatinine Clearance: 45 mL/min (A) (by C-G formula based on SCr of 1.39 mg/dL (H)). Liver Function Tests: Recent Labs  Lab 11/25/19 1302 11/26/19 0452 11/27/19 0509  AST 171* 113* 209*  ALT 129* 115* 177*  ALKPHOS 882* 802* 969*  BILITOT 8.4* 8.0* 7.8*  PROT 4.3* 4.6* 4.9*  ALBUMIN 1.8* 1.8* 2.0*   No results for input(s): LIPASE, AMYLASE in the last 168 hours. Recent Labs  Lab 11/25/19 1758  AMMONIA 35   Coagulation profile Recent Labs  Lab 11/22/19 1438 11/22/19 1449 11/22/19 1557 11/25/19 1313 11/26/19 0452  INR >10.0* 8.0* 9.5* 4.6* 1.1    CBC: Recent Labs  Lab 11/25/19 1302 11/26/19 0452 11/27/19 0509  WBC 5.4 3.8* 8.4  NEUTROABS  --   --  5.6  HGB 11.5* 11.1* 11.1*  HCT 32.7* 31.7* 32.5*  MCV 87.2 86.8 87.4  PLT 222 222 258   Cardiac Enzymes: No results for input(s): CKTOTAL, CKMB, CKMBINDEX, TROPONINI in the last 168  hours. BNP: Invalid input(s): POCBNP CBG: Recent Labs  Lab 11/26/19 1142 11/26/19 1615 11/26/19 2025 11/27/19 0807 11/27/19 1159  GLUCAP 330* 357* 277* 141* 241*   D-Dimer No results for input(s): DDIMER in the last 72 hours. Hgb A1c No results for input(s): HGBA1C in the last 72 hours. Lipid Profile No results for input(s): CHOL, HDL, LDLCALC, TRIG, CHOLHDL, LDLDIRECT in the last 72 hours. Thyroid function studies No results for input(s): TSH, T4TOTAL, T3FREE, THYROIDAB in the last 72 hours.  Invalid input(s): FREET3 Anemia work up No results for input(s): VITAMINB12, FOLATE, FERRITIN, TIBC, IRON, RETICCTPCT in the last 72 hours. Microbiology Recent Results (from the past 240 hour(s))  Respiratory Panel by RT PCR (Flu A&B, Covid) - Nasopharyngeal Swab     Status: None   Collection Time: 11/25/19  2:10 PM   Specimen: Nasopharyngeal Swab  Result Value Ref Range Status   SARS Coronavirus 2 by RT PCR NEGATIVE NEGATIVE Final  Comment: (NOTE) SARS-CoV-2 target nucleic acids are NOT DETECTED. The SARS-CoV-2 RNA is generally detectable in upper respiratoy specimens during the acute phase of infection. The lowest concentration of SARS-CoV-2 viral copies this assay can detect is 131 copies/mL. A negative result does not preclude SARS-Cov-2 infection and should not be used as the sole basis for treatment or other patient management decisions. A negative result may occur with  improper specimen collection/handling, submission of specimen other than nasopharyngeal swab, presence of viral mutation(s) within the areas targeted by this assay, and inadequate number of viral copies (<131 copies/mL). A negative result must be combined with clinical observations, patient history, and epidemiological information. The expected result is Negative. Fact Sheet for Patients:  PinkCheek.be Fact Sheet for Healthcare Providers:   GravelBags.it This test is not yet ap proved or cleared by the [place] FDA and  has been authorized for detection and/or diagnosis of SARS-CoV-2 by FDA under an Emergency Use Authorization (EUA). This EUA will remain  in effect (meaning this test can be used) for the duration of the COVID-19 declaration under Section 564(b)(1) of the Act, 21 U.S.C. section 360bbb-3(b)(1), unless the authorization is terminated or revoked sooner.    Influenza A by PCR NEGATIVE NEGATIVE Final   Influenza B by PCR NEGATIVE NEGATIVE Final    Comment: (NOTE) The Xpert Xpress SARS-CoV-2/FLU/RSV assay is intended as an aid in  the diagnosis of influenza from Nasopharyngeal swab specimens and  should not be used as a sole basis for treatment. Nasal washings and  aspirates are unacceptable for Xpert Xpress SARS-CoV-2/FLU/RSV  testing. Fact Sheet for Patients: PinkCheek.be Fact Sheet for Healthcare Providers: GravelBags.it This test is not yet approved or cleared by the [place] FDA and  has been authorized for detection and/or diagnosis of SARS-CoV-2 by  FDA under an Emergency Use Authorization (EUA). This EUA will remain  in effect (meaning this test can be used) for the duration of the  Covid-19 declaration under Section 564(b)(1) of the Act, 21  U.S.C. section 360bbb-3(b)(1), unless the authorization is  terminated or revoked. Performed at [facility], [place] [address] [place], [place] [address]      Discharge Instructions:   Discharge Instructions    Diet Carb Modified   Complete by: As directed    Discharge instructions   Complete by: As directed    Follow-up plan as per the hospice provider.   Increase activity slowly   Complete by: As directed      Allergies as of 11/27/2019   No Known Allergies     Medication List    STOP taking these medications   atorvastatin 80 MG  tablet Commonly known as: LIPITOR   glipiZIDE 5 MG tablet Commonly known as: GLUCOTROL   ibuprofen 200 MG tablet Commonly known as: ADVIL   nicotine 21 mg/24hr patch Commonly known as: NICODERM CQ - dosed in mg/24 hours   warfarin 5 MG tablet Commonly known as: COUMADIN     TAKE these medications   aspirin EC 81 MG tablet Take 81 mg by mouth daily.   calcium carbonate 500 MG chewable tablet Commonly known as: TUMS - dosed in mg elemental calcium Chew 1 tablet (200 mg of elemental calcium total) by mouth 2 (two) times daily as needed for indigestion or heartburn.   dexamethasone 2 MG tablet Commonly known as: DECADRON 2 tablets twice daily x7 days then 1 tablet twice daily x7 days then 1 tablet daily x7 days and 1 tablet every  other day x12 days and stop   famotidine 20 MG tablet Commonly known as: PEPCID Take 1 tablet (20 mg total) by mouth at bedtime.   Fish Oil 1200 MG Caps Take 1,200 mg by mouth daily.   levETIRAcetam 1000 MG tablet Commonly known as: KEPPRA Take 1 tablet (1,000 mg total) by mouth 2 (two) times daily.   metFORMIN 1000 MG tablet Commonly known as: GLUCOPHAGE Take 1 tablet (1,000 mg total) by mouth 2 (two) times daily with a meal.   mirtazapine 15 MG disintegrating tablet Commonly known as: REMERON SOL-TAB Take 1 tablet (15 mg total) by mouth at bedtime.   oxyCODONE 5 MG immediate release tablet Commonly known as: Oxy IR/ROXICODONE Take 1 tablet (5 mg total) by mouth every 6 (six) hours as needed for moderate pain or severe pain.   prochlorperazine 10 MG tablet Commonly known as: COMPAZINE Take 1 tablet (10 mg total) by mouth every 6 (six) hours as needed for nausea or vomiting.   QUEtiapine 25 MG tablet Commonly known as: SEROQUEL Take 0.5 tablets (12.5 mg total) by mouth at bedtime.   senna-docusate 8.6-50 MG tablet Commonly known as: Senokot-S Take 1 tablet by mouth 2 (two) times daily.         Follow-up [facility]. Go on 12/03/2019.   Specialty: Oncology Why: Tuesday at 1:30.  Contact information: [place] [id number] [place] [phone]           Time coordinating discharge: 39 minutes  Signed:  Emslee Lopezmartinez  [facility] 11/27/2019, 2:05 PM

## 2019-11-27 NOTE — Progress Notes (Signed)
Provider notified of need for medication for diarrhea. Pt started having diarrhea this am.

## 2019-11-27 NOTE — Telephone Encounter (Signed)
R./s appt per 2/24 sch message - pt wife aware

## 2019-11-27 NOTE — Consult Note (Signed)
Palliative care consult note  Reason for consult: Goals of care in light of stage IV lung cancer complicated by acute liver failure  Medicare consult received.  Chart reviewed including personal review of pertinent labs and imaging.  Mr. Iddings is known to our team from prior admission where he was seen by my partner, Dr. Rhea Pink.  Her note was reviewed and I discussed case with her as well prior to seeing Mr. Kendzierski.  Briefly, Mr. 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  I met today with Mr. Franchini and his wife.  I introduced palliative care as specialized medical care for people living with serious illness. It focuses on providing relief from the symptoms and stress of a serious illness. The goal is to improve quality of life for both the patient and the family.  We discussed clinical course as well as wishes moving forward in regard to  care plan in light of his advanced cancer and liver failure.  Concepts specific to code status and rehospitalization discussed.  We discussed difference between a aggressive medical intervention path and a palliative, comfort focused care path.  Values and goals of care important to patient and family were attempted to be elicited.  Mr. Warchol reports understanding that he has terminal illness and that he likely does not have long to live.  He reports that his goal moving forward is to discharge home today, stay at home as long as his wife is able to care for him, and then transition somewhere else to be cared for until the time that he dies.  Concept of Hospice and Palliative Care were  discussed.  He expressed interest in hospice services and we talked about hospice care and venues where hospice care can be received.  Following conversation, he reports that he is done pursuing disease modifying therapy and wants to transition home with the support of hospice.  He is hopeful to stay at home as long as possible, stating "I do not care if it is 2 days or 2 weeks."  He then would like to transition to residential hospice as he approaches his death.  -Mr. Sarno and his wife are clear that they desire to transition home with the support of hospice.  He is adamant that he be discharged today. -I placed a referral to social work to help facilitate home hospice. -Discussed with Dr. Louanne Belton regarding care plan and recommendation for discharge home with hospice today. -Questions and concerns addressed.     Please call if there are other specific needs with which we can be of assistance.  Time in: 1230 Total time: Time out: 1340 Total time: 70 minutes  Greater than 50%  of this time was spent counseling and coordinating care related to the above assessment and plan.  Micheline Rough, MD [place] Team [phone]

## 2019-11-27 NOTE — Progress Notes (Signed)
Palliative Medicine RN Note: Rec'd call from Shanon Brow, who is rounding on Geoffrey Benjamin, following up on consult (placed 2/22 at 1737). Geoffrey Benjamin is anxious to leave; recommended OP Palliative Care referral, as Dr Hilma Favors is assigned to see him & is currently in a conference call and cannot tell me when she will be seeing him.  Rec'd a message back from Dr Hilma Favors, who is familiar with Geoffrey Benjamin from her consult on 10/03/19. She was planning to see him later this afternoon. I called Shanon Brow back with an update. He reports that Geoffrey Benjamin is very anxious to leave and that OP PC is likely the plan. I did explain that Dr Hilma Favors had to initiate a benzo at her 12/31 visit, and Shanon Brow verbalized understanding.   Marjie Skiff Alexcia Schools, RN, BSN, [facility] Palliative Medicine Team 11/27/2019 11:54 AM Office [phone]

## 2019-11-28 ENCOUNTER — Emergency Department (HOSPITAL_COMMUNITY): Payer: Medicare Other

## 2019-11-28 ENCOUNTER — Inpatient Hospital Stay (HOSPITAL_COMMUNITY)
Admission: EM | Admit: 2019-11-28 | Discharge: 2019-11-29 | DRG: 193 | Disposition: A | Discharge status: Hospice | Payer: Medicare Other | Attending: Internal Medicine | Admitting: Internal Medicine

## 2019-11-28 ENCOUNTER — Encounter (HOSPITAL_COMMUNITY): Payer: Self-pay

## 2019-11-28 ENCOUNTER — Other Ambulatory Visit: Payer: Self-pay

## 2019-11-28 DIAGNOSIS — N289 Disorder of kidney and ureter, unspecified: Secondary | ICD-10-CM

## 2019-11-28 DIAGNOSIS — E878 Other disorders of electrolyte and fluid balance, not elsewhere classified: Secondary | ICD-10-CM | POA: Diagnosis present

## 2019-11-28 DIAGNOSIS — K7200 Acute and subacute hepatic failure without coma: Secondary | ICD-10-CM | POA: Diagnosis present

## 2019-11-28 DIAGNOSIS — Z20822 Contact with and (suspected) exposure to covid-19: Secondary | ICD-10-CM | POA: Diagnosis present

## 2019-11-28 DIAGNOSIS — C7931 Secondary malignant neoplasm of brain: Secondary | ICD-10-CM | POA: Diagnosis present

## 2019-11-28 DIAGNOSIS — I509 Heart failure, unspecified: Secondary | ICD-10-CM | POA: Diagnosis present

## 2019-11-28 DIAGNOSIS — Z66 Do not resuscitate: Secondary | ICD-10-CM | POA: Diagnosis present

## 2019-11-28 DIAGNOSIS — Z7952 Long term (current) use of systemic steroids: Secondary | ICD-10-CM

## 2019-11-28 DIAGNOSIS — F17210 Nicotine dependence, cigarettes, uncomplicated: Secondary | ICD-10-CM | POA: Diagnosis present

## 2019-11-28 DIAGNOSIS — D6859 Other primary thrombophilia: Secondary | ICD-10-CM | POA: Diagnosis present

## 2019-11-28 DIAGNOSIS — J9601 Acute respiratory failure with hypoxia: Secondary | ICD-10-CM

## 2019-11-28 DIAGNOSIS — R630 Anorexia: Secondary | ICD-10-CM | POA: Diagnosis present

## 2019-11-28 DIAGNOSIS — G40909 Epilepsy, unspecified, not intractable, without status epilepticus: Secondary | ICD-10-CM | POA: Diagnosis present

## 2019-11-28 DIAGNOSIS — Z841 Family history of disorders of kidney and ureter: Secondary | ICD-10-CM

## 2019-11-28 DIAGNOSIS — J90 Pleural effusion, not elsewhere classified: Secondary | ICD-10-CM

## 2019-11-28 DIAGNOSIS — Z7189 Other specified counseling: Secondary | ICD-10-CM

## 2019-11-28 DIAGNOSIS — E785 Hyperlipidemia, unspecified: Secondary | ICD-10-CM | POA: Diagnosis present

## 2019-11-28 DIAGNOSIS — Z8719 Personal history of other diseases of the digestive system: Secondary | ICD-10-CM

## 2019-11-28 DIAGNOSIS — C3491 Malignant neoplasm of unspecified part of right bronchus or lung: Secondary | ICD-10-CM

## 2019-11-28 DIAGNOSIS — Z515 Encounter for palliative care: Secondary | ICD-10-CM

## 2019-11-28 DIAGNOSIS — K219 Gastro-esophageal reflux disease without esophagitis: Secondary | ICD-10-CM | POA: Diagnosis present

## 2019-11-28 DIAGNOSIS — Z7982 Long term (current) use of aspirin: Secondary | ICD-10-CM

## 2019-11-28 DIAGNOSIS — Z823 Family history of stroke: Secondary | ICD-10-CM | POA: Diagnosis not present

## 2019-11-28 DIAGNOSIS — R262 Difficulty in walking, not elsewhere classified: Secondary | ICD-10-CM | POA: Diagnosis present

## 2019-11-28 DIAGNOSIS — E871 Hypo-osmolality and hyponatremia: Secondary | ICD-10-CM | POA: Diagnosis present

## 2019-11-28 DIAGNOSIS — Z6821 Body mass index (BMI) 21.0-21.9, adult: Secondary | ICD-10-CM

## 2019-11-28 DIAGNOSIS — Z9981 Dependence on supplemental oxygen: Secondary | ICD-10-CM

## 2019-11-28 DIAGNOSIS — E11649 Type 2 diabetes mellitus with hypoglycemia without coma: Secondary | ICD-10-CM | POA: Diagnosis present

## 2019-11-28 DIAGNOSIS — C787 Secondary malignant neoplasm of liver and intrahepatic bile duct: Secondary | ICD-10-CM | POA: Diagnosis present

## 2019-11-28 DIAGNOSIS — I350 Nonrheumatic aortic (valve) stenosis: Secondary | ICD-10-CM | POA: Diagnosis present

## 2019-11-28 DIAGNOSIS — I110 Hypertensive heart disease with heart failure: Secondary | ICD-10-CM | POA: Diagnosis present

## 2019-11-28 DIAGNOSIS — Z7984 Long term (current) use of oral hypoglycemic drugs: Secondary | ICD-10-CM

## 2019-11-28 DIAGNOSIS — J189 Pneumonia, unspecified organism: Secondary | ICD-10-CM | POA: Diagnosis not present

## 2019-11-28 DIAGNOSIS — R7989 Other specified abnormal findings of blood chemistry: Secondary | ICD-10-CM

## 2019-11-28 DIAGNOSIS — R351 Nocturia: Secondary | ICD-10-CM | POA: Diagnosis present

## 2019-11-28 DIAGNOSIS — E119 Type 2 diabetes mellitus without complications: Secondary | ICD-10-CM

## 2019-11-28 DIAGNOSIS — Z79899 Other long term (current) drug therapy: Secondary | ICD-10-CM

## 2019-11-28 LAB — COMPREHENSIVE METABOLIC PANEL
ALT: 149 U/L — ABNORMAL HIGH (ref 0–44)
AST: 102 U/L — ABNORMAL HIGH (ref 15–41)
Albumin: 2 g/dL — ABNORMAL LOW (ref 3.5–5.0)
Alkaline Phosphatase: 844 U/L — ABNORMAL HIGH (ref 38–126)
Anion gap: 8 (ref 5–15)
BUN: 29 mg/dL — ABNORMAL HIGH (ref 8–23)
CO2: 24 mmol/L (ref 22–32)
Calcium: 7.7 mg/dL — ABNORMAL LOW (ref 8.9–10.3)
Chloride: 96 mmol/L — ABNORMAL LOW (ref 98–111)
Creatinine, Ser: 1.38 mg/dL — ABNORMAL HIGH (ref 0.61–1.24)
GFR calc Af Amer: 57 mL/min — ABNORMAL LOW (ref >60)
GFR calc non Af Amer: 49 mL/min — ABNORMAL LOW (ref >60)
Glucose, Bld: 203 mg/dL — ABNORMAL HIGH (ref 70–99)
Potassium: 4.6 mmol/L (ref 3.5–5.1)
Sodium: 128 mmol/L — ABNORMAL LOW (ref 135–145)
Total Bilirubin: 5.4 mg/dL — ABNORMAL HIGH (ref 0.3–1.2)
Total Protein: 4.8 g/dL — ABNORMAL LOW (ref 6.5–8.1)

## 2019-11-28 LAB — CBC WITH DIFFERENTIAL/PLATELET
Abs Immature Granulocytes: 0.04 10*3/uL (ref 0.00–0.07)
Basophils Absolute: 0 10*3/uL (ref 0.0–0.1)
Basophils Relative: 0 %
Eosinophils Absolute: 0 10*3/uL (ref 0.0–0.5)
Eosinophils Relative: 0 %
HCT: 31.6 % — ABNORMAL LOW (ref 39.0–52.0)
Hemoglobin: 10.6 g/dL — ABNORMAL LOW (ref 13.0–17.0)
Immature Granulocytes: 1 %
Lymphocytes Relative: 16 %
Lymphs Abs: 1.1 10*3/uL (ref 0.7–4.0)
MCH: 29.4 pg (ref 26.0–34.0)
MCHC: 33.5 g/dL (ref 30.0–36.0)
MCV: 87.5 fL (ref 80.0–100.0)
Monocytes Absolute: 1.1 10*3/uL — ABNORMAL HIGH (ref 0.1–1.0)
Monocytes Relative: 16 %
Neutro Abs: 4.4 10*3/uL (ref 1.7–7.7)
Neutrophils Relative %: 67 %
Platelets: 214 10*3/uL (ref 150–400)
RBC: 3.61 MIL/uL — ABNORMAL LOW (ref 4.22–5.81)
RDW: 16.9 % — ABNORMAL HIGH (ref 11.5–15.5)
WBC: 6.6 10*3/uL (ref 4.0–10.5)
nRBC: 0 % (ref 0.0–0.2)

## 2019-11-28 LAB — GLUCOSE, CAPILLARY
Glucose-Capillary: 166 mg/dL — ABNORMAL HIGH (ref 70–99)
Glucose-Capillary: 214 mg/dL — ABNORMAL HIGH (ref 70–99)
Glucose-Capillary: 262 mg/dL — ABNORMAL HIGH (ref 70–99)
Glucose-Capillary: 166 mg/dL — ABNORMAL HIGH (ref 70–99)

## 2019-11-28 LAB — AMMONIA: Ammonia: 35 umol/L (ref 9–35)

## 2019-11-28 LAB — D-DIMER, QUANTITATIVE: D-Dimer, Quant: 1.43 ug/mL-FEU — ABNORMAL HIGH (ref 0.00–0.50)

## 2019-11-28 LAB — LEVETIRACETAM LEVEL: Levetiracetam Lvl: 36.1 ug/mL (ref 10.0–40.0)

## 2019-11-28 LAB — BRAIN NATRIURETIC PEPTIDE: B Natriuretic Peptide: 965.1 pg/mL — ABNORMAL HIGH (ref 0.0–100.0)

## 2019-11-28 LAB — PROTIME-INR
INR: 0.9 (ref 0.8–1.2)
Prothrombin Time: 12.2 seconds (ref 11.4–15.2)

## 2019-11-28 LAB — SARS CORONAVIRUS 2 (TAT 6-24 HRS): SARS Coronavirus 2: NEGATIVE

## 2019-11-28 MED ORDER — SODIUM CHLORIDE 0.9 % IV SOLN
1.0000 g | INTRAVENOUS | Status: DC
  Administered 2019-11-29: 06:00:00 1 g via INTRAVENOUS
  Filled 2019-11-28: qty 10

## 2019-11-28 MED ORDER — INSULIN ASPART 100 UNIT/ML ~~LOC~~ SOLN
0.0000 [IU] | Freq: Three times a day (TID) | SUBCUTANEOUS | Status: DC
  Administered 2019-11-28: 8 [IU] via SUBCUTANEOUS
  Administered 2019-11-28: 10:00:00 3 [IU] via SUBCUTANEOUS
  Administered 2019-11-28: 13:00:00 5 [IU] via SUBCUTANEOUS
  Administered 2019-11-29: 13:00:00 8 [IU] via SUBCUTANEOUS
  Filled 2019-11-28: qty 0.15

## 2019-11-28 MED ORDER — PROCHLORPERAZINE MALEATE 10 MG PO TABS: 10.0000 mg | ORAL_TABLET | Freq: Four times a day (QID) | ORAL | Status: DC | PRN

## 2019-11-28 MED ORDER — SODIUM CHLORIDE (PF) 0.9 % IJ SOLN
INTRAMUSCULAR | Status: AC
  Filled 2019-11-28: qty 50

## 2019-11-28 MED ORDER — IOHEXOL 350 MG/ML SOLN
100.0000 mL | Freq: Once | INTRAVENOUS | Status: AC | PRN
  Administered 2019-11-28: 04:00:00 100 mL via INTRAVENOUS

## 2019-11-28 MED ORDER — OXYCODONE HCL 5 MG PO TABS
5.0000 mg | ORAL_TABLET | ORAL | Status: DC | PRN
  Administered 2019-11-28 – 2019-11-29 (×5): 5 mg via ORAL
  Filled 2019-11-28 (×5): qty 1

## 2019-11-28 MED ORDER — HEPARIN SODIUM (PORCINE) 5000 UNIT/ML IJ SOLN
5000.0000 [IU] | Freq: Three times a day (TID) | INTRAMUSCULAR | Status: DC
  Administered 2019-11-28 – 2019-11-29 (×5): 5000 [IU] via SUBCUTANEOUS
  Filled 2019-11-28 (×5): qty 1

## 2019-11-28 MED ORDER — LEVETIRACETAM 500 MG PO TABS
1000.0000 mg | ORAL_TABLET | Freq: Two times a day (BID) | ORAL | Status: DC
  Administered 2019-11-28 – 2019-11-29 (×2): 1000 mg via ORAL
  Filled 2019-11-28 (×3): qty 2

## 2019-11-28 MED ORDER — IPRATROPIUM-ALBUTEROL 0.5-2.5 (3) MG/3ML IN SOLN
3.0000 mL | Freq: Once | RESPIRATORY_TRACT | Status: AC
  Administered 2019-11-28: 05:00:00 3 mL via RESPIRATORY_TRACT
  Filled 2019-11-28: qty 3

## 2019-11-28 MED ORDER — IPRATROPIUM-ALBUTEROL 0.5-2.5 (3) MG/3ML IN SOLN
3.0000 mL | Freq: Four times a day (QID) | RESPIRATORY_TRACT | Status: DC
  Administered 2019-11-28 (×2): 3 mL via RESPIRATORY_TRACT
  Filled 2019-11-28 (×2): qty 3

## 2019-11-28 MED ORDER — ORAL CARE MOUTH RINSE
15.0000 mL | Freq: Two times a day (BID) | OROMUCOSAL | Status: DC
  Administered 2019-11-29: 15 mL via OROMUCOSAL

## 2019-11-28 MED ORDER — SODIUM CHLORIDE 0.9 % IV SOLN
500.0000 mg | Freq: Once | INTRAVENOUS | Status: AC
  Administered 2019-11-28: 500 mg via INTRAVENOUS
  Filled 2019-11-28: qty 500

## 2019-11-28 MED ORDER — ONDANSETRON HCL 4 MG/2ML IJ SOLN: 4.0000 mg | Freq: Four times a day (QID) | INTRAMUSCULAR | Status: DC | PRN

## 2019-11-28 MED ORDER — ENSURE ENLIVE PO LIQD: 237.0000 mL | Freq: Two times a day (BID) | ORAL | Status: DC

## 2019-11-28 MED ORDER — ONDANSETRON HCL 4 MG PO TABS: 4.0000 mg | ORAL_TABLET | Freq: Four times a day (QID) | ORAL | Status: DC | PRN

## 2019-11-28 MED ORDER — AZITHROMYCIN 250 MG PO TABS
500.0000 mg | ORAL_TABLET | Freq: Every day | ORAL | Status: DC
  Administered 2019-11-29: 500 mg via ORAL
  Filled 2019-11-28: qty 2

## 2019-11-28 MED ORDER — IPRATROPIUM-ALBUTEROL 0.5-2.5 (3) MG/3ML IN SOLN
3.0000 mL | Freq: Three times a day (TID) | RESPIRATORY_TRACT | Status: DC
  Filled 2019-11-28 (×2): qty 3

## 2019-11-28 MED ORDER — INSULIN ASPART 100 UNIT/ML ~~LOC~~ SOLN
0.0000 [IU] | Freq: Every day | SUBCUTANEOUS | Status: DC
  Filled 2019-11-28: qty 0.05

## 2019-11-28 MED ORDER — MIRTAZAPINE 15 MG PO TBDP
15.0000 mg | ORAL_TABLET | Freq: Every day | ORAL | Status: DC
  Administered 2019-11-28: 15 mg via ORAL
  Filled 2019-11-28 (×2): qty 1

## 2019-11-28 MED ORDER — SODIUM CHLORIDE 0.9 % IV SOLN
2.0000 g | Freq: Once | INTRAVENOUS | Status: AC
  Administered 2019-11-28: 05:00:00 2 g via INTRAVENOUS
  Filled 2019-11-28: qty 20

## 2019-11-28 MED ORDER — IPRATROPIUM-ALBUTEROL 0.5-2.5 (3) MG/3ML IN SOLN: 3.0000 mL | Freq: Two times a day (BID) | RESPIRATORY_TRACT | Status: DC

## 2019-11-28 MED ORDER — METHYLPREDNISOLONE SODIUM SUCC 40 MG IJ SOLR
40.0000 mg | INTRAMUSCULAR | Status: DC
  Administered 2019-11-28 – 2019-11-29 (×2): 40 mg via INTRAVENOUS
  Filled 2019-11-28 (×2): qty 1

## 2019-11-28 MED ORDER — IPRATROPIUM-ALBUTEROL 0.5-2.5 (3) MG/3ML IN SOLN
3.0000 mL | RESPIRATORY_TRACT | Status: DC | PRN
  Administered 2019-11-28 – 2019-11-29 (×2): 3 mL via RESPIRATORY_TRACT
  Filled 2019-11-28 (×2): qty 3

## 2019-11-28 MED ORDER — QUETIAPINE 12.5 MG HALF TABLET
12.0000 mg | ORAL_TABLET | Freq: Every day | ORAL | Status: DC
  Administered 2019-11-28: 22:00:00 12.5 mg via ORAL
  Filled 2019-11-28 (×2): qty 1

## 2019-11-28 MED ORDER — IPRATROPIUM-ALBUTEROL 0.5-2.5 (3) MG/3ML IN SOLN
3.0000 mL | Freq: Once | RESPIRATORY_TRACT | Status: AC
  Administered 2019-11-28: 02:00:00 3 mL via RESPIRATORY_TRACT
  Filled 2019-11-28: qty 3

## 2019-11-28 MED ORDER — TRAMADOL HCL 50 MG PO TABS
50.0000 mg | ORAL_TABLET | Freq: Four times a day (QID) | ORAL | Status: DC | PRN
  Administered 2019-11-28: 50 mg via ORAL
  Filled 2019-11-28: qty 1

## 2019-11-28 NOTE — H&P (Signed)
History and Physical    Geoffrey Benjamin RKY:[id number] DOB: [birth year] DOA: 11/28/2019  PCP: Heywood Bene, PA-C (Confirm with patient/family/NH records and if not entered, this has to be entered at [facility] point of entry) Patient coming from: Home  I have personally briefly reviewed patient's old medical records in [place]  Chief Complaint: Worsening shortness of breath  HPI: Geoffrey Benjamin is a 79 y.o. male with medical history significant of stage IV lung cancer, brain cancer, hypertension, hyperlipidemia is brought to ED by EMS for evaluation of worsening shortness of breath.  Patient states that he has a history of exertional dyspnea because of stage IV lung cancer but for the last 2 days his condition continue to worsen.  Normally he uses oxygen at home on as needed basis but today he continuously used oxygen but still it was not helping with the symptoms.  Patient is also admitting of having episodic productive cough but denies fever, chills, night sweats, chest pain.  Patient states that his condition is not getting better with anything but dyspnea worsens with mild exertion.  Patient further mentioned that he was here last night because he wants palliative care.  Patient also reported that he had 1 infusion on February 3RD for his lungs and brain cancer and will have another infusion on March 3.   Note : Patient CODE STATUS was DNR during the last visit but today when patient was asked about the CODE STATUS, he answered that he does not know and he wants everything to be done if his heart or lungs stop working.  Patient further mentioned that his wife will be here in the morning and they can decide about the CODE STATUS.  It is a request to the day team to confirm his CODE STATUS in the presence of his wife in the morning.  ED Course: On arrival to ED patient had blood pressure of 101/63, temperature 96.7, heart rate 73, respiratory rate 21 and was already placed on 4 L of oxygen  with nasal cannula by the EMS.  Blood work showed WBC 6.6, hemoglobin 10.6, creatinine 1.38, blood glucose 203, alkaline phosphatase 844, AST 102, ALT 149, bilirubin 5.4 and D-dimer 1.43.  Chest x-ray showed right-sided pleural effusion and bilateral pulmonary nodules and mediastinal adenopathy.  CTA chest was negative for acute pulmonary artery embolism but showed complete opacification of right lower lobe airways and partial opacification of right middle lobe airways and multifocal nodular opacities with both lungs have increased from prior studies.  Moderate to large right-sided pleural effusion.  It was managed with DuoNeb, 1 dose of IV ceftriaxone and IV azithromycin.  Review of Systems: As per HPI otherwise 10 point review of systems negative.    Past Medical History:  Diagnosis Date   Aortic stenosis 09/13/2018   CAP (community acquired pneumonia) 09/13/2018   CHF (congestive heart failure) ([place])    Difficulty walking    DM (diabetes mellitus) ([place])    GERD 06/04/2009   Qualifier: Diagnosis of  By: Henrene Pastor MD, Taurus N    Hyperlipidemia 09/13/2018   Hypertension    Loss of weight 09/29/2008   Qualifier: Diagnosis of  By: Bobby Rumpf CMA (AAMA), Patty     Non-small cell carcinoma of right lung, stage 4 ([place])    PERSONAL HX COLONIC POLYPS 06/04/2009   Qualifier: Diagnosis of  By: Henrene Pastor MD, Docia Chuck    RLQ PAIN 09/29/2008   Qualifier: Diagnosis of  By: Bobby Rumpf CMA (AAMA), Patty  Tachycardia    Tobacco use 09/13/2018   Volume overload 09/13/2018    Past Surgical History:  Procedure Laterality Date   CATARACT EXTRACTION, BILATERAL  08/2016   DOPPLER ECHOCARDIOGRAPHY     Mitral inflow and tissue doppler consistent with impaired LV relaxation; Trileaflet aortic vlave with moderate aortic valve stenosis, Trivial mitral and tricuspid valve regurgitation.     reports that he has been smoking cigarettes. He has been smoking about 1.50 packs per day. He has never used smokeless  tobacco. He reports current alcohol use. He reports that he does not use drugs.  No Known Allergies  Family History  Problem Relation Age of Onset   Kidney disease Father    Stroke Mother     Prior to Admission medications   Medication Sig Start Date End Date Taking? Authorizing Provider  aspirin EC 81 MG tablet Take 81 mg by mouth daily.   Yes [provider]  famotidine (PEPCID) 20 MG tablet Take 1 tablet (20 mg total) by mouth at bedtime. 10/15/19  Yes Angiulli, Lavon Paganini, PA-C  levETIRAcetam (KEPPRA) 1000 MG tablet Take 1 tablet (1,000 mg total) by mouth 2 (two) times daily. 10/15/19  Yes Angiulli, Lavon Paganini, PA-C  metFORMIN (GLUCOPHAGE) 1000 MG tablet Take 1 tablet (1,000 mg total) by mouth 2 (two) times daily with a meal. 10/15/19  Yes Angiulli, Lavon Paganini, PA-C  mirtazapine (REMERON SOL-TAB) 15 MG disintegrating tablet Take 1 tablet (15 mg total) by mouth at bedtime. 10/15/19  Yes Angiulli, Lavon Paganini, PA-C  Omega-3 Fatty Acids (FISH OIL) 1200 MG CAPS Take 1,200 mg by mouth daily.   Yes [provider]  oxyCODONE (OXY IR/ROXICODONE) 5 MG immediate release tablet Take 1 tablet (5 mg total) by mouth every 6 (six) hours as needed for moderate pain or severe pain. 10/15/19  Yes Angiulli, Lavon Paganini, PA-C  prochlorperazine (COMPAZINE) 10 MG tablet Take 1 tablet (10 mg total) by mouth every 6 (six) hours as needed for nausea or vomiting. 10/30/19  Yes Curt Bears, MD  QUEtiapine (SEROQUEL) 25 MG tablet Take 0.5 tablets (12.5 mg total) by mouth at bedtime. 10/15/19  Yes Angiulli, Lavon Paganini, PA-C  calcium carbonate (TUMS - DOSED IN MG ELEMENTAL CALCIUM) 500 MG chewable tablet Chew 1 tablet (200 mg of elemental calcium total) by mouth 2 (two) times daily as needed for indigestion or heartburn. Patient not taking: Reported on 10/30/2019 10/15/19   Angiulli, Lavon Paganini, PA-C  dexamethasone (DECADRON) 2 MG tablet 2 tablets twice daily x7 days then 1 tablet twice daily x7 days then 1 tablet  daily x7 days and 1 tablet every other day x12 days and stop Patient not taking: Reported on 11/28/2019 10/15/19   Angiulli, Lavon Paganini, PA-C  senna-docusate (SENOKOT-S) 8.6-50 MG tablet Take 1 tablet by mouth 2 (two) times daily. Patient not taking: Reported on 10/30/2019 10/15/19   Cathlyn Parsons, PA-C    Physical Exam: Vitals:   11/28/19 0400 11/28/19 0430 11/28/19 0500 11/28/19 0530  BP: 116/90 96/64 100/64 (!) 92/59  Pulse: (!) 111 98 (!) 107 (!) 106  Resp: (!) 22 15 16 15   Temp:      TempSrc:      SpO2: 95% 92% 99% 96%  Weight:      Height:        Constitutional: NAD, calm, comfortable Vitals:   11/28/19 0400 11/28/19 0430 11/28/19 0500 11/28/19 0530  BP: 116/90 96/64 100/64 (!) 92/59  Pulse: (!) 111 98 (!) 107 (!) 106  Resp: (!) 22 15 16 15   Temp:      TempSrc:      SpO2: 95% 92% 99% 96%  Weight:      Height:        General: Patient is a 79 years old cachectic Caucasian male on 4 L of oxygen with nasal cannula, laying comfortably in the bed. Eyes: PERRL, lids and conjunctivae normal ENMT: Mucous membranes are moist. Posterior pharynx clear of any exudate or lesions.Normal dentition.  Neck: normal, supple, no masses, no thyromegaly Respiratory: Patient is on 4 L of oxygen with nasal cannula.  Lungs have diffuse inspiratory and expiratory rhonchi.  Diffuse wheezing on bilateral lungs.  No accessory muscle use.  Cardiovascular: Regular rate and rhythm, no murmurs / rubs / gallops. No extremity edema. 2+ pedal pulses. No carotid bruits.  Abdomen: Mild tenderness in the right upper quadrant with deep palpation, no masses palpated. No hepatosplenomegaly. Bowel sounds positive.  Musculoskeletal: no clubbing / cyanosis. No joint deformity upper and lower extremities. Good ROM, no contractures. Normal muscle tone.  Skin: no rashes, lesions, ulcers. No induration Neurologic: CN 2-12 grossly intact. Sensation intact, DTR normal. Strength 5/5 in all 4.  Psychiatric: Normal judgment  and insight. Alert and oriented x 3. Normal mood.    Labs on Admission: I have personally reviewed following labs and imaging studies  CBC: Recent Labs  Lab 11/25/19 1302 11/26/19 0452 11/27/19 0509 11/28/19 0202  WBC 5.4 3.8* 8.4 6.6  NEUTROABS  --   --  5.6 4.4  HGB 11.5* 11.1* 11.1* 10.6*  HCT 32.7* 31.7* 32.5* 31.6*  MCV 87.2 86.8 87.4 87.5  PLT 222 222 258 696   Basic Metabolic Panel: Recent Labs  Lab 11/25/19 1302 11/26/19 0452 11/27/19 0509 11/28/19 0202  NA 125* 124* 127* 128*  K 3.6 3.4* 4.2 4.6  CL 92* 91* 95* 96*  CO2 21* 20* 24 24  GLUCOSE 41* 269* 142* 203*  BUN 27* 30* 33* 29*  CREATININE 1.14 1.34* 1.39* 1.38*  CALCIUM 7.0* 7.4* 7.7* 7.7*  MG 1.9  --  2.0  --   PHOS 3.6  --  3.3  --    GFR: Estimated Creatinine Clearance: 45.3 mL/min (A) (by C-G formula based on SCr of 1.38 mg/dL (H)). Liver Function Tests: Recent Labs  Lab 11/25/19 1302 11/26/19 0452 11/27/19 0509 11/28/19 0202  AST 171* 113* 209* 102*  ALT 129* 115* 177* 149*  ALKPHOS 882* 802* 969* 844*  BILITOT 8.4* 8.0* 7.8* 5.4*  PROT 4.3* 4.6* 4.9* 4.8*  ALBUMIN 1.8* 1.8* 2.0* 2.0*   No results for input(s): LIPASE, AMYLASE in the last 168 hours. Recent Labs  Lab 11/25/19 1758 11/28/19 0202  AMMONIA 35 35   Coagulation Profile: Recent Labs  Lab 11/22/19 1449 11/22/19 1557 11/25/19 1313 11/26/19 0452 11/28/19 0202  INR 8.0* 9.5* 4.6* 1.1 0.9   Cardiac Enzymes: No results for input(s): CKTOTAL, CKMB, CKMBINDEX, TROPONINI in the last 168 hours. BNP (last 3 results) No results for input(s): PROBNP in the last 8760 hours. HbA1C: No results for input(s): HGBA1C in the last 72 hours. CBG: Recent Labs  Lab 11/26/19 1142 11/26/19 1615 11/26/19 2025 11/27/19 0807 11/27/19 1159  GLUCAP 330* 357* 277* 141* 241*   Lipid Profile: No results for input(s): CHOL, HDL, LDLCALC, TRIG, CHOLHDL, LDLDIRECT in the last 72 hours. Thyroid Function Tests: No results for input(s):  TSH, T4TOTAL, FREET4, T3FREE, THYROIDAB in the last 72 hours. Anemia Panel: No results for input(s): VITAMINB12, FOLATE,  FERRITIN, TIBC, IRON, RETICCTPCT in the last 72 hours. Urine analysis:    Component Value Date/Time   COLORURINE AMBER (A) 11/25/2019 2234   APPEARANCEUR CLOUDY (A) 11/25/2019 2234   LABSPEC 1.008 11/25/2019 2234   PHURINE 5.0 11/25/2019 2234   GLUCOSEU NEGATIVE 11/25/2019 2234   HGBUR MODERATE (A) 11/25/2019 2234   BILIRUBINUR NEGATIVE 11/25/2019 2234   KETONESUR NEGATIVE 11/25/2019 2234   PROTEINUR NEGATIVE 11/25/2019 2234   NITRITE NEGATIVE 11/25/2019 2234   LEUKOCYTESUR MODERATE (A) 11/25/2019 2234    Radiological Exams on Admission: CT Angio Chest PE W and/or Wo Contrast  Result Date: 11/28/2019 CLINICAL DATA:  PE suspected, positive D-dimer, extreme dyspnea, stage IV lung and brain cancer EXAM: CT ANGIOGRAPHY CHEST WITH CONTRAST TECHNIQUE: Multidetector CT imaging of the chest was performed using the standard protocol during bolus administration of intravenous contrast. Multiplanar CT image reconstructions and MIPs were obtained to evaluate the vascular anatomy. CONTRAST:  167mL OMNIPAQUE IOHEXOL 350 MG/ML SOLN COMPARISON:  CT 09/02/2019 FINDINGS: Cardiovascular: Satisfactory opacification the pulmonary arteries to the segmental level. No pulmonary artery filling defects are identified. Central pulmonary arteries are normal caliber. Cardiac size at the upper limits of normal. Coronary artery calcifications are present. No pericardial effusion. Calcifications present on the aortic leaflets with a calcified but normal caliber thoracic aorta. Additional atherosclerotic plaque noted at the origins of the great vessels which branch normally from the aortic arch and within the upper abdominal aorta which demonstrates some irregular eccentric mural plaque, similar to comparison study. Major venous structures are unremarkable. Mediastinum/Nodes: Extensive mediastinal and hilar  adenopathy is noted, largest nodes include a 19 mm right paratracheal node (4/56) a 16 mm left hilar node (4/66) and a 27 mm subcarinal node (4/76). Coronal narrowing of the trachea compatible with obstructive pulmonary disease. There are extensive secretions in the airways including near complete opacification of the right lower lobe airways and partial opacification of the right middle lobe airways. Esophagus appears diffusely thickened. Thyroid gland and thoracic inlet are unremarkable. Lungs/Pleura: There is a background of centrilobular and paraseptal emphysematous changes with superimposed areas of patchy ground-glass and consolidative opacity within the lungs. Multifocal nodular opacities within both lungs have increased from prior compatible worsening metastatic disease, largest lesions include a an 11 mm in nodule in the superior segment left lower lobe (5/67), a spiculated 16 mm nodule in the medial right lower lobe (6/89) and a 14 mm nodule in the periphery of the superior segment right upper lobe (6/37). Moderate to large right pleural effusion. Small left effusion. Essentially complete atelectatic collapse of the right middle lobe and subtotal atelectatic collapse of the right lower lobe, the latter of which demonstrates a relative hypoattenuating appearance which may be related to decreased relative density though underlying infection could present with a similar appearance. Interstitial, tree-in-bud and ground-glass opacities are present in the remaining aerated portions of the right lower lobe superior segment. Upper Abdomen: No acute abnormalities present in the visualized portions of the upper abdomen. Mild bilateral symmetric perinephric stranding, a nonspecific finding though may correlate with either age or decreased renal function. Appearance similar to prior. Musculoskeletal: Multilevel degenerative changes are present in the imaged portions of the spine. Stable compression deformity at T12.  No new suspicious lesions. Remote posttraumatic deformity of the left eighth through tenth ribs. Circumferential body wall edema and paucity of subcutaneous fat, correlate with cachexia. Review of the MIP images confirms the above findings. IMPRESSION: 1. No evidence of acute pulmonary artery embolism. 2. Extensive  secretions in the airways including near complete opacification of the right lower lobe airways and partial opacification of the right middle lobe airways. Complete collapse of the right middle lobe and subtotal collapse of right lower lobe. Differential attenuation of the atelectatic lung may be related to the degree atelectasis versus underlying infection in the lower lobe. Additional infectious/inflammatory opacities present in the remaining aerated portions of the superior segment right lower lobe. 3. Multifocal nodular opacities within both lungs have increased from prior study compatible with worsening metastatic disease. 4. Extensive mediastinal and hilar adenopathy consistent with a combination of metastatic disease and reactive adenopathy. 5. Moderate to large right pleural effusion.  Small left effusion. 6. Extensive body wall edema and paucity of subcutaneous fat, correlate with edema on a background of cachexia. 7. Aortic Atherosclerosis (ICD10-I70.0). Electronically Signed   By: Lovena Le M.D.   On: 11/28/2019 04:06   DG Chest Port 1 View  Result Date: 11/28/2019 CLINICAL DATA:  Increasing shortness of breath, metastatic lung cancer EXAM: PORTABLE CHEST 1 VIEW COMPARISON:  11/12/2019, 11/25/2019 FINDINGS: Two frontal views of the chest demonstrate a stable enlarged cardiac silhouette. Bilateral hilar fullness consistent with metastatic adenopathy as per recent PET scan. Persistent right pleural effusion. Diffuse nodularity throughout the lungs compatible with known bilateral pulmonary metastases. Right basilar atelectasis again noted. No pneumothorax. No acute fractures. IMPRESSION: 1.  Continued right pleural effusion. 2. Widespread pulmonary metastatic disease, with bilateral pulmonary nodules and mediastinal adenopathy. Please refer to recent PET scan. Electronically Signed   By: Randa Ngo M.D.   On: 11/28/2019 01:48     Assessment/Plan Principal Problem:    Acute respiratory failure with hypoxia (HCC) Patient normally uses 2 L of oxygen at home as needed but today he required 4 L of oxygen to maintain oxygen saturation within normal limits.  Patient has a history of stage IV lung cancer and getting treatment for that as outpatient.  Patient states that he wants to be comfortable and need to talk about palliative care. DuoNeb scheduled and as needed ordered Continue IV ceftriaxone and azithromycin for multifocal pneumonia IV Solu-Medrol ordered Active Problems:  Multifocal pneumonia See above    Type 2 diabetes mellitus (HCC) Sliding scale insulin ordered    Adenocarcinoma of right lung, stage 4 (HCC)   Liver failure, acute Patient has elevated liver enzymes most likely from metastasis due to lung cancer.  DVT prophylaxis: Heparin  Code Status: Patient CODE STATUS was DNR according to the medical record on his previous visit recently but patient states that he want to be full code till his wife comes here in the morning. Disposition Plan: Patient states that he want to be on palliative care.  Palliative care consult ordered. Consults called: Palliative care consult ordered Admission status: Inpatient /MedSurg   Edmonia Lynch MD [facility] Pager [phone]  If 7PM-7AM, please contact night-coverage [email or web address] Password   11/28/2019, 6:14 AM

## 2019-11-28 NOTE — Consult Note (Signed)
Palliative care consult note  Reason for consult: Goals of care in light of stage IV lung cancer complicated by acute liver failure  Palliative care consult received.  Chart reviewed including personal review of pertinent labs and imaging.  Briefly, Mr. Rothbauer is a 79 year old male with past medical history of stage IV lung cancer with metastasis to liver, bilateral lungs, brain, seizure disorder who was started on palliative immunotherapy with Keytruda with first dose of February 30 2021 who was then admitted with increasing weakness, diarrhea, poor appetite and found to have acute liver failure with elevated bilirubin, transaminitis, and decreased albumin.  I met today with Mr. Goddard and his wife.  He is known to me from visit yesterday when he was discharged home with plan for hospice support.  Mr. Imes reports understanding that he has terminal illness and that he likely does not have long to live.  He confirms that his goal moving forward is to discharge home, stay at home as long as his wife is able to care for him, and then transition somewhere else to be cared for until the time that he dies.  He reports after discharging home yesterday, he became more symptomatic last evening and "did not know what to do."  While plan has been for transition home with the support of hospice, he was unable to have intake visit yesterday and discharged home yesterday despite this.  He still wants to work to get home with hospice, however, he is willing to wait until his intake visit can be arranged and would like to discharge home that same day of intake visit.  -Mr. Karwowski and his wife are clear that they desire to transition home with the support of hospice.  He decided this yesterday and was adamant that he be discharged yesterday.  Unfortunately, he became more symptomatic overnight and returned to the hospital prior to having his intake visit for hospice (was working to be scheduled this morning).  He  is still wanting to transition home with the support of hospice.  He is now in agreement to stay till tomorrow while we work on arrangements. -Discussed with Dr. Bonner Puna regarding care plan and plan for discharge home with hospice tomorrow.   -Will call and discuss with [facility] liaison to ensure continuity of care and that they are able to admit him the same day that he discharges. -Confirmed CODE STATUS of DNR.  Order placed. -Agree with plan for oxycodone for pain or shortness of breath. -Questions and concerns addressed.     Please call if there are other specific needs with which we can be of assistance.  Time in: 1420  Total time: Time out: 1500 Total time: 40 minutes  Greater than 50%  of this time was spent counseling and coordinating care related to the above assessment and plan.  Micheline Rough, MD [place] Team [phone]

## 2019-11-28 NOTE — ED Provider Notes (Signed)
[facility] DEPT Provider Note   CSN: [id number] Arrival date & time: 11/28/19  0033   History Chief Complaint  Patient presents with  . Shortness of Breath    Geoffrey Benjamin is a 79 y.o. male.  The history is provided by the patient.  Shortness of Breath He has history of metastatic lung cancer, diabetes, hypertension, hyperlipidemia, heart failure, primary hypercoagulable state and comes in tonight with shortness of breath which started about 2 hours ago.  There has been a slight cough productive of small amount of clear sputum.  He denies fever, chills, sweats.  Nothing seems to make his dyspnea better, nothing makes it worse.  He denies chest pain, heaviness, tightness, pressure.  EMS arrived and placed on oxygen.  There was no report of what his initial oxygen saturation was.  At home, he uses oxygen on an as-needed basis.  Past Medical History:  Diagnosis Date  . Aortic stenosis 09/13/2018  . CAP (community acquired pneumonia) 09/13/2018  . CHF (congestive heart failure) ([place])   . Difficulty walking   . DM (diabetes mellitus) ([place])   . GERD 06/04/2009   Qualifier: Diagnosis of  By: Henrene Pastor MD, Docia Chuck   . Hyperlipidemia 09/13/2018  . Hypertension   . Loss of weight 09/29/2008   Qualifier: Diagnosis of  By: Bobby Rumpf CMA (AAMA), [place]    . Non-small cell carcinoma of right lung, stage 4 ([place])   . PERSONAL HX COLONIC POLYPS 06/04/2009   Qualifier: Diagnosis of  By: Henrene Pastor MD, [place] PAIN 09/29/2008   Qualifier: Diagnosis of  By: Bobby Rumpf CMA (AAMA), [place]    . Tachycardia   . Tobacco use 09/13/2018  . Volume overload 09/13/2018    Patient Active Problem List   Diagnosis Date Noted  . Malnutrition of moderate degree 11/26/2019  . Liver failure, acute 11/25/2019  . Hypoglycemia 11/25/2019  . Encounter for therapeutic drug monitoring 11/14/2019  . CVA (cerebral vascular accident) ([place]) 11/14/2019  . Primary hypercoagulable state ([place]) 11/14/2019   . Encounter for antineoplastic immunotherapy 10/30/2019  . Pressure injury of skin 10/16/2019  . Aortic atherosclerosis ([place]) 10/07/2019  . Emphysema of lung ([place]) 10/07/2019  . Metastatic adenocarcinoma ([place]) 10/07/2019  . 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 Status epilepticus ([place]) 10/01/2019  . Adenocarcinoma of right lung, stage 4 ([place]) 09/05/2019  . Goals of care, counseling/discussion 09/05/2019  . Brain metastases ([place]) 09/02/2019  . Right sided weakness 09/01/2019  . Acute systolic congestive heart failure ([place]) 09/19/2018  . CAP (community acquired pneumonia) 09/13/2018  . Hyperlipidemia 09/13/2018  . Volume overload 09/13/2018  . Tobacco use 09/13/2018  . Aortic stenosis 09/13/2018  . Chronic pain 11/10/2015  . Hypertension 11/10/2015  . Tobacco dependence 11/10/2015  . GERD 06/04/2009  . PERSONAL HX COLONIC POLYPS 06/04/2009  . LOSS OF WEIGHT 09/29/2008  . RLQ PAIN 09/29/2008  . Type 2 diabetes mellitus ([place]) 09/22/2008    Past Surgical History:  Procedure Laterality Date  . CATARACT EXTRACTION, BILATERAL  08/2016  . DOPPLER ECHOCARDIOGRAPHY     Mitral inflow and tissue doppler consistent with impaired LV relaxation; Trileaflet aortic vlave with moderate aortic valve stenosis, Trivial mitral and tricuspid valve regurgitation.       Family History  Problem Relation Age of Onset  . Kidney disease Father   . Stroke Mother     Social History   Tobacco Use  . Smoking status: Current Every Day Smoker  Packs/day: 1.50    Types: Cigarettes  . Smokeless tobacco: Never Used  . Tobacco comment: SMOKED FOR 60 PLUS YEARS  Substance Use Topics  . Alcohol use: Yes    Comment: occas  . Drug use: Never    Home Medications Prior to Admission medications   Medication Sig Start Date End Date Taking? Authorizing Provider  aspirin EC 81 MG tablet Take 81 mg by mouth daily.    [provider]  calcium carbonate (TUMS - DOSED IN MG ELEMENTAL  CALCIUM) 500 MG chewable tablet Chew 1 tablet (200 mg of elemental calcium total) by mouth 2 (two) times daily as needed for indigestion or heartburn. Patient not taking: Reported on 10/30/2019 10/15/19   Angiulli, Lavon Paganini, PA-C  dexamethasone (DECADRON) 2 MG tablet 2 tablets twice daily x7 days then 1 tablet twice daily x7 days then 1 tablet daily x7 days and 1 tablet every other day x12 days and stop 10/15/19   Angiulli, Lavon Paganini, PA-C  famotidine (PEPCID) 20 MG tablet Take 1 tablet (20 mg total) by mouth at bedtime. 10/15/19   Angiulli, Lavon Paganini, PA-C  levETIRAcetam (KEPPRA) 1000 MG tablet Take 1 tablet (1,000 mg total) by mouth 2 (two) times daily. 10/15/19   Angiulli, Lavon Paganini, PA-C  metFORMIN (GLUCOPHAGE) 1000 MG tablet Take 1 tablet (1,000 mg total) by mouth 2 (two) times daily with a meal. 10/15/19   Angiulli, Lavon Paganini, PA-C  mirtazapine (REMERON SOL-TAB) 15 MG disintegrating tablet Take 1 tablet (15 mg total) by mouth at bedtime. 10/15/19   Angiulli, Lavon Paganini, PA-C  Omega-3 Fatty Acids (FISH OIL) 1200 MG CAPS Take 1,200 mg by mouth daily.    [provider]  oxyCODONE (OXY IR/ROXICODONE) 5 MG immediate release tablet Take 1 tablet (5 mg total) by mouth every 6 (six) hours as needed for moderate pain or severe pain. 10/15/19   Angiulli, Lavon Paganini, PA-C  prochlorperazine (COMPAZINE) 10 MG tablet Take 1 tablet (10 mg total) by mouth every 6 (six) hours as needed for nausea or vomiting. 10/30/19   Curt Bears, MD  QUEtiapine (SEROQUEL) 25 MG tablet Take 0.5 tablets (12.5 mg total) by mouth at bedtime. 10/15/19   Angiulli, Lavon Paganini, PA-C  senna-docusate (SENOKOT-S) 8.6-50 MG tablet Take 1 tablet by mouth 2 (two) times daily. Patient not taking: Reported on 10/30/2019 10/15/19   Angiulli, Lavon Paganini, PA-C    Allergies    Patient has no known allergies.  Review of Systems   Review of Systems  Respiratory: Positive for shortness of breath.   All other systems reviewed and are  negative.   Physical Exam Updated Vital Signs BP 99/63 (BP Location: Right Arm)   Pulse 91   Temp (!) 96.7 F (35.9 C) (Axillary)   Resp 18   Ht 6' (1.829 m)   Wt 70.3 kg   SpO2 99% Comment: weaned to 2L  BMI 21.02 kg/m   Physical Exam Vitals and nursing note reviewed.   79 year old male, resting comfortably and in no acute distress. Vital signs are normal. Oxygen saturation is 99%, which is normal. Head is normocephalic and atraumatic. PERRLA, EOMI. Oropharynx is clear. Neck is nontender and supple without adenopathy or JVD. Back is nontender and there is no CVA tenderness. Lungs have rather diffuse inspiratory and expiratory rhonchi, scattered expiratory wheezes.  There are no rales appreciated. Chest is nontender. Heart has regular rate and rhythm without murmur. Abdomen is soft, flat, nontender without masses or hepatosplenomegaly and peristalsis  is normoactive. Extremities have no cyanosis or edema, full range of motion is present. Skin is warm and dry without rash. Neurologic: Mental status is normal, cranial nerves are intact, there are no motor or sensory deficits.  ED Results / Procedures / Treatments   Labs (all labs ordered are listed, but only abnormal results are displayed) Labs Reviewed  COMPREHENSIVE METABOLIC PANEL - Abnormal; Notable for the following components:      Result Value   Sodium 128 (*)    Chloride 96 (*)    Glucose, Bld 203 (*)    BUN 29 (*)    Creatinine, Ser 1.38 (*)    Calcium 7.7 (*)    Total Protein 4.8 (*)    Albumin 2.0 (*)    AST 102 (*)    ALT 149 (*)    Alkaline Phosphatase 844 (*)    Total Bilirubin 5.4 (*)    GFR calc non Af Amer 49 (*)    GFR calc Af Amer 57 (*)    All other components within normal limits  CBC WITH DIFFERENTIAL/PLATELET - Abnormal; Notable for the following components:   RBC 3.61 (*)    Hemoglobin 10.6 (*)    HCT 31.6 (*)    RDW 16.9 (*)    Monocytes Absolute 1.1 (*)    All other components within  normal limits  BRAIN NATRIURETIC PEPTIDE - Abnormal; Notable for the following components:   B Natriuretic Peptide 965.1 (*)    All other components within normal limits  D-DIMER, QUANTITATIVE (NOT AT [facility]) - Abnormal; Notable for the following components:   D-Dimer, Quant 1.43 (*)    All other components within normal limits  SARS CORONAVIRUS 2 (TAT 6-24 HRS)  PROTIME-INR  AMMONIA    EKG EKG Interpretation  Date/Time:  Thursday November 28 2019 01:27:31 EST Ventricular Rate:  91 PR Interval:    QRS Duration: 127 QT Interval:  372 QTC Calculation: 458 R Axis:   -54 Text Interpretation: Sinus rhythm Left bundle branch block When compared with ECG of 11/25/2019, No significant change was found Confirmed by Delora Fuel ([id number]) on 11/28/2019 1:31:24 AM   Radiology CT Angio Chest PE W and/or Wo Contrast  Result Date: 11/28/2019 CLINICAL DATA:  PE suspected, positive D-dimer, extreme dyspnea, stage IV lung and brain cancer EXAM: CT ANGIOGRAPHY CHEST WITH CONTRAST TECHNIQUE: Multidetector CT imaging of the chest was performed using the standard protocol during bolus administration of intravenous contrast. Multiplanar CT image reconstructions and MIPs were obtained to evaluate the vascular anatomy. CONTRAST:  154mL OMNIPAQUE IOHEXOL 350 MG/ML SOLN COMPARISON:  CT 09/02/2019 FINDINGS: Cardiovascular: Satisfactory opacification the pulmonary arteries to the segmental level. No pulmonary artery filling defects are identified. Central pulmonary arteries are normal caliber. Cardiac size at the upper limits of normal. Coronary artery calcifications are present. No pericardial effusion. Calcifications present on the aortic leaflets with a calcified but normal caliber thoracic aorta. Additional atherosclerotic plaque noted at the origins of the great vessels which branch normally from the aortic arch and within the upper abdominal aorta which demonstrates some irregular eccentric mural plaque, similar to  comparison study. Major venous structures are unremarkable. Mediastinum/Nodes: Extensive mediastinal and hilar adenopathy is noted, largest nodes include a 19 mm right paratracheal node (4/56) a 16 mm left hilar node (4/66) and a 27 mm subcarinal node (4/76). Coronal narrowing of the trachea compatible with obstructive pulmonary disease. There are extensive secretions in the airways including near complete opacification of the right lower lobe airways  and partial opacification of the right middle lobe airways. Esophagus appears diffusely thickened. Thyroid gland and thoracic inlet are unremarkable. Lungs/Pleura: There is a background of centrilobular and paraseptal emphysematous changes with superimposed areas of patchy ground-glass and consolidative opacity within the lungs. Multifocal nodular opacities within both lungs have increased from prior compatible worsening metastatic disease, largest lesions include a an 11 mm in nodule in the superior segment left lower lobe (5/67), a spiculated 16 mm nodule in the medial right lower lobe (6/89) and a 14 mm nodule in the periphery of the superior segment right upper lobe (6/37). Moderate to large right pleural effusion. Small left effusion. Essentially complete atelectatic collapse of the right middle lobe and subtotal atelectatic collapse of the right lower lobe, the latter of which demonstrates a relative hypoattenuating appearance which may be related to decreased relative density though underlying infection could present with a similar appearance. Interstitial, tree-in-bud and ground-glass opacities are present in the remaining aerated portions of the right lower lobe superior segment. Upper Abdomen: No acute abnormalities present in the visualized portions of the upper abdomen. Mild bilateral symmetric perinephric stranding, a nonspecific finding though may correlate with either age or decreased renal function. Appearance similar to prior. Musculoskeletal:  Multilevel degenerative changes are present in the imaged portions of the spine. Stable compression deformity at T12. No new suspicious lesions. Remote posttraumatic deformity of the left eighth through tenth ribs. Circumferential body wall edema and paucity of subcutaneous fat, correlate with cachexia. Review of the MIP images confirms the above findings. IMPRESSION: 1. No evidence of acute pulmonary artery embolism. 2. Extensive secretions in the airways including near complete opacification of the right lower lobe airways and partial opacification of the right middle lobe airways. Complete collapse of the right middle lobe and subtotal collapse of right lower lobe. Differential attenuation of the atelectatic lung may be related to the degree atelectasis versus underlying infection in the lower lobe. Additional infectious/inflammatory opacities present in the remaining aerated portions of the superior segment right lower lobe. 3. Multifocal nodular opacities within both lungs have increased from prior study compatible with worsening metastatic disease. 4. Extensive mediastinal and hilar adenopathy consistent with a combination of metastatic disease and reactive adenopathy. 5. Moderate to large right pleural effusion.  Small left effusion. 6. Extensive body wall edema and paucity of subcutaneous fat, correlate with edema on a background of cachexia. 7. Aortic Atherosclerosis (ICD10-I70.0). Electronically Signed   By: Lovena Le M.D.   On: 11/28/2019 04:06   DG Chest Port 1 View  Result Date: 11/28/2019 CLINICAL DATA:  Increasing shortness of breath, metastatic lung cancer EXAM: PORTABLE CHEST 1 VIEW COMPARISON:  11/12/2019, 11/25/2019 FINDINGS: Two frontal views of the chest demonstrate a stable enlarged cardiac silhouette. Bilateral hilar fullness consistent with metastatic adenopathy as per recent PET scan. Persistent right pleural effusion. Diffuse nodularity throughout the lungs compatible with known  bilateral pulmonary metastases. Right basilar atelectasis again noted. No pneumothorax. No acute fractures. IMPRESSION: 1. Continued right pleural effusion. 2. Widespread pulmonary metastatic disease, with bilateral pulmonary nodules and mediastinal adenopathy. Please refer to recent PET scan. Electronically Signed   By: Randa Ngo M.D.   On: 11/28/2019 01:48    Procedures Procedures   Medications Ordered in ED Medications - No data to display  ED Course  I have reviewed the triage vital signs and the nursing notes.  Pertinent labs & imaging results that were available during my care of the patient were reviewed by me and considered  in my medical decision making (see chart for details).  MDM Rules/Calculators/A&P Dyspnea of uncertain cause.  Oxygen saturation is 99-100% on 4 L of nasal oxygen.  Oxygen was reduced to 2 L/min with no change in oxygen saturation.  Oxygen was then turned off, and oxygen saturation has stabilized at 94%.  Old records are reviewed, and he was discharged earlier today following hospital admission for liver failure and GI bleed.  He had been on warfarin for his hypercoagulable state, and that had been discontinued because of the GI bleed.  Will check chest x-ray.  Because of lung cancer and known hypercoagulable state and having been taken off of warfarin, will screen for pulmonary embolism with D-dimer.  We will also check BNP.  During recent hospitalization, he also had hyponatremia as well as hypoglycemia.  Glucose has been satisfactory here.  Chest x-ray is not significantly changed from prior showing metastatic lung cancer.  Labs show stable anemia and stable renal insufficiency.  Hyponatremia is present and not significantly changed from prior.  D-dimer is elevated and he is sent for CT angiogram of the chest which shows bilateral pleural effusions, debris obstructing the right middle lobe and right lower lobe bronchi and probable postobstructive pneumonia.  He is  started on antibiotics and will be admitted for antibiotics and aggressive pulmonary toilet.  Case is discussed with Dr. Humphrey Rolls of [facility], who agrees to admit the patient.  Sequan Auxier Alessandrini was evaluated in Emergency Department on 11/28/2019 for the symptoms described in the history of present illness. He was evaluated in the context of the global COVID-19 pandemic, which necessitated consideration that the patient might be at risk for infection with the SARS-CoV-2 virus that causes COVID-19. Institutional protocols and algorithms that pertain to the evaluation of patients at risk for COVID-19 are in a state of rapid change based on information released by regulatory bodies including the CDC and federal and state organizations. These policies and algorithms were followed during the patient's care in the ED.  Final Clinical Impression(s) / ED Diagnoses Final diagnoses:  None    Rx / DC Orders ED Discharge Orders    None       Delora Fuel, MD [birth year] [phone]

## 2019-11-28 NOTE — Progress Notes (Signed)
[id number] -- Manufacturing engineer [facility]) Hospital Liaison RN Note  Notified by Dr. Micheline Rough of patient/family request for [facility] services at home after discharge. Chart will be reviewed at time of visit of [place] admission RN in the home with the hospice MD.  Spoke with wife Geoffrey Benjamin to initiate education related to hospice philosophy, services and team approach to care. She will need additional support of information given. Per discussion, plan is for discharge home tomorrow by private vehicle.  Please send signed and completed out of facility DNR from with patient/family.  Patient will need prescriptions for discharge comfort medications.  DME needs discussed, patient already has a walker in the home. Patient requests hospital bed with 1/2 rails, OBT, 3 in 1 and shower transfer bench. [place] equipment specialist has been notified and will contact [employer] to arrange delivery to the home. Home address has been verified and is correct in the chart. Geoffrey Benjamin [phone]) is the family member to contact to arrange the time of equipment delivery.  [facility] aware of the above. Please fax completed discharge summary to [facility] at [phone] when final. Please notify [facility] when patient is ready to leave the unit at discharge. Please call [phone].  Above information share with Alinda Sierras with [facility] team.  Please call with any hospice related questions or concerns.  Thank you, Margaretmary Eddy, RN, BSN [facility] Liaison [phone]  [place] are on [place]

## 2019-11-28 NOTE — TOC Transition Note (Signed)
Transition of Care [facility]) - CM/SW Discharge Note   Patient Details  Name: Geoffrey Benjamin MRN: [id number] Date of Birth: [birth year]  Transition of Care [facility]) CM/SW Contact:  Lynnell Catalan, RN Phone Number: 11/28/2019, 2:32 PM   Clinical Narrative:     CM consult for home with hospice. Pt had been referred to [facility] yesterday prior to pt discharging from the hospital. [facility] was contacted and they will follow along, and see pt at his home when discharged from the hospital.          Readmission Risk Interventions Readmission Risk Prevention Plan 11/27/2019  Transportation Screening Complete  Medication Review ([place]) Complete  PCP or Specialist appointment within 3-5 days of discharge Complete  HRI or [place] Complete  SW Recovery Care/Counseling Consult Complete  [place] Not Applicable  Some recent data might be hidden

## 2019-11-28 NOTE — ED Notes (Signed)
Patient transported to X-ray 

## 2019-11-28 NOTE — Progress Notes (Signed)
PROGRESS NOTE  Brief Narrative: Geoffrey Benjamin is a 79 y.o. 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 He was discharged yesterday afternoon to home hospice only to return a few hours later to the ED with dyspnea. Work up has suggested postobstructive pneumonia and he remains on IV antibiotics, supplemental oxygen, having been admitted this morning by Dr. Humphrey Rolls.   Subjective: Short of breath slightly better than admission this morning. Has severe pain all over which is improved with oxycodone. Wants to go home as soon as possible. Reporting bothersome low volume frequent nocturia.  Objective: BP (!) 91/55 (BP Location: Left Arm)   Pulse 96   Temp 97.6 F (36.4 C) (Oral)   Resp 18   Ht 6' (1.829 m)   Wt 70.3 kg   SpO2 98%   BMI 21.02 kg/m   Gen: Chronically ill-appearing but in no distress Pulm: Coarse, diminished in right lung fields, nonlabored on supplemental oxygen   Assessment & Plan: Prolonged discussion with patient and wife regarding hospice rationale and goals of care to which they agree. They just didn't have it set up yet and felt they had nothing else to do but come back to the hospital. Palliative care is consulted here and will assist with arranging a seamless transition to home hospice services, ideally would be admitted to those services on arrival back home after discharge from here. Will change tramadol to oxycodone. Based on my discussions with the patient and wife, will change back to DNR status. Continue IV antibiotics for now. Check postvoid residual. Continue home AED.  Patrecia Pour, MD Pager on [facility] 11/28/2019, 2:46 PM

## 2019-11-28 NOTE — ED Triage Notes (Signed)
Per EMS, patient c/o shortness of breath. Pt says he is exertionally dyspneic at baseline, but feels his SOB is getting worse. EMS put patient on 4L of nasal cannula and is satting at 97%. Hx of stage 4 lung and brain cancer, next infusion march 3rd (last infusion feb 3rd). Patient was seen here last night for abdominal bleeding, but states his goal for this hospital visit is to get a referral for hospice. Per EMS pt has a bed sore around buttocks, and c/o right arm neuropathy pain.  Patient from home, is a&ox4 and ambulatory at baseline.

## 2019-11-29 DIAGNOSIS — Z515 Encounter for palliative care: Secondary | ICD-10-CM

## 2019-11-29 LAB — COMPREHENSIVE METABOLIC PANEL
ALT: 124 U/L — ABNORMAL HIGH (ref 0–44)
AST: 61 U/L — ABNORMAL HIGH (ref 15–41)
Albumin: 2 g/dL — ABNORMAL LOW (ref 3.5–5.0)
Alkaline Phosphatase: 617 U/L — ABNORMAL HIGH (ref 38–126)
Anion gap: 9 (ref 5–15)
BUN: 25 mg/dL — ABNORMAL HIGH (ref 8–23)
CO2: 26 mmol/L (ref 22–32)
Calcium: 7.7 mg/dL — ABNORMAL LOW (ref 8.9–10.3)
Chloride: 98 mmol/L (ref 98–111)
Creatinine, Ser: 1.22 mg/dL (ref 0.61–1.24)
GFR calc Af Amer: >60 mL/min (ref >60)
GFR calc non Af Amer: 57 mL/min — ABNORMAL LOW (ref >60)
Glucose, Bld: 105 mg/dL — ABNORMAL HIGH (ref 70–99)
Potassium: 4.3 mmol/L (ref 3.5–5.1)
Sodium: 133 mmol/L — ABNORMAL LOW (ref 135–145)
Total Bilirubin: 3.5 mg/dL — ABNORMAL HIGH (ref 0.3–1.2)
Total Protein: 4.6 g/dL — ABNORMAL LOW (ref 6.5–8.1)

## 2019-11-29 LAB — CBC
HCT: 32.1 % — ABNORMAL LOW (ref 39.0–52.0)
Hemoglobin: 11 g/dL — ABNORMAL LOW (ref 13.0–17.0)
MCH: 30 pg (ref 26.0–34.0)
MCHC: 34.3 g/dL (ref 30.0–36.0)
MCV: 87.5 fL (ref 80.0–100.0)
Platelets: 173 10*3/uL (ref 150–400)
RBC: 3.67 MIL/uL — ABNORMAL LOW (ref 4.22–5.81)
RDW: 16.8 % — ABNORMAL HIGH (ref 11.5–15.5)
WBC: 6.5 10*3/uL (ref 4.0–10.5)
nRBC: 0 % (ref 0.0–0.2)

## 2019-11-29 LAB — GLUCOSE, CAPILLARY
Glucose-Capillary: 116 mg/dL — ABNORMAL HIGH (ref 70–99)
Glucose-Capillary: 280 mg/dL — ABNORMAL HIGH (ref 70–99)

## 2019-11-29 MED ORDER — ATROPINE SULFATE 1 % OP SOLN: 2.0000 [drp] | Freq: Three times a day (TID) | OPHTHALMIC | 0 refills | Status: AC | PRN

## 2019-11-29 MED ORDER — SENNOSIDES-DOCUSATE SODIUM 8.6-50 MG PO TABS: 1.0000 | ORAL_TABLET | Freq: Every day | ORAL | 0 refills | Status: AC

## 2019-11-29 MED ORDER — LORAZEPAM 0.5 MG PO TABS: 0.5000 mg | ORAL_TABLET | Freq: Three times a day (TID) | ORAL | 0 refills | Status: AC | PRN

## 2019-11-29 MED ORDER — IPRATROPIUM-ALBUTEROL 0.5-2.5 (3) MG/3ML IN SOLN: 3.0000 mL | RESPIRATORY_TRACT | 0 refills | Status: AC | PRN

## 2019-11-29 MED ORDER — OXYCODONE HCL 5 MG PO TABS: 5.0000 mg | ORAL_TABLET | ORAL | 0 refills | Status: AC | PRN

## 2019-11-29 NOTE — Progress Notes (Signed)
Palliative care brief note  I checked in on Geoffrey Benjamin today.  Wife at bedside as well.  They deny questions or needs.  Plan is for discharge home and hospice intake appointment for 5PM today.  Please call if there are specific needs with which our team can be of assistance.  Micheline Rough, MD [facility] Team [phone]  NO CHARGE NOTE

## 2019-11-29 NOTE — Progress Notes (Signed)
Manufacturing engineer  Additional DME ordered, nebulizer machine and oxygen.  Venia Carbon RN, BSN, [facility] Liaison  [phone]

## 2019-12-01 NOTE — Discharge Summary (Signed)
[facility] Discharge Summary   Patient: Geoffrey Benjamin WVP:[id number]  PCP: Heywood Bene, PA-C  Date of admission: 11/28/2019   Date of discharge: 11/29/2019      Discharge Diagnoses:  Principal Problem:   Acute respiratory failure with hypoxia ([place]) Active Problems:   Type 2 diabetes mellitus ([place])   Adenocarcinoma of right lung, stage 4 (HCC)   Liver failure, acute   Hospice care patient   Admitted From: home Disposition:  Home with hospice  Recommendations for Outpatient Follow-up:  1. PCP: establish care with hospice 2. Follow up LABS/TEST:  none  Follow-up Information    [facility] Follow up.   Why: establish care.  Contact information: [place] [id number]       Heywood Bene, [place]. Call.   Specialty: Physician Assistant Why: as needed Contact information: [address] [place] [address] [place] [address] [phone]          Diet recommendation: Regular diet  Activity: The patient is advised to gradually reintroduce usual activities, as tolerated  Discharge Condition: stable  Code Status: DNR   History of present illness: As per the H and P dictated on admission, "Geoffrey Benjamin is a 79 y.o. male with medical history significant of stage IV lung cancer, brain cancer, hypertension, hyperlipidemia is brought to ED by EMS for evaluation of worsening shortness of breath.  Patient states that he has a history of exertional dyspnea because of stage IV lung cancer but for the last 2 days his condition continue to worsen.  Normally he uses oxygen at home on as needed basis but today he continuously used oxygen but still it was not helping with the symptoms.  Patient is also admitting of having episodic productive cough but denies fever, chills, night sweats, chest pain.  Patient states that his condition is not getting better with anything but dyspnea worsens with mild exertion.  Patient further mentioned that he  was here last night because he wants palliative care.  Patient also reported that he had 1 infusion on February 3RD for his lungs and brain cancer and will have another infusion on March 3.   Note : Patient CODE STATUS was DNR during the last visit but today when patient was asked about the CODE STATUS, he answered that he does not know and he wants everything to be done if his heart or lungs stop working.  Patient further mentioned that his wife will be here in the morning and they can decide about the CODE STATUS.  It is a request to the day team to confirm his CODE STATUS in the presence of his wife in the morning."  Hospital Course:  Summary of his active problems in the hospital is as following.  Goals of care discussion  Postobstructive pneumonia  Prolonged discussion with patient and wife regarding hospice rationale and goals of care to which they agree.  They just didn't have it set up yet and felt they had nothing else to do but come back to the hospital.  Palliative care is consulted here. Will change tramadol to oxycodone.  change back to DNR status.  No further antibiotics for now.   Stage IV adenocarcinoma of the lungs with metastasis:  Received first dose of Keytruda on 2/3.  status post brain radiation.  Palliative care on board and at this time patient and the family wishes to go home with hospice care at home.  Elevated bilirubin/transaminitis:  Coagulopathy with hematuria:  Type 2 diabetes with hypoglycemia: Hypochloremic hyponatremia: History of seizures.  Continue Keppra  On the day of the discharge the patient's vitals were stable, and no other acute medical condition were reported by patient. the patient was felt safe to be discharge at Home with home hospice.  Consultants: Palliative care  Procedures: none  Discharge Exam: General: Appear in mild distress, no Rash; Oral Mucosa Clear, moist. Cardiovascular: S1 and S2 Present, no Murmur, Respiratory:  increased respiratory effort, Bilateral Air entry present and bilateral  Crackles, no wheezes Abdomen: Bowel Sound present, Soft and no tenderness, no hernia Extremities: no Pedal edema, no calf tenderness Neurology: alert and oriented to time, place, and person affect anxious.  Filed Weights   11/28/19 0102  Weight: 70.3 kg   Vitals:   11/29/19 0507 11/29/19 1326  BP: 100/60 (!) 96/59  Pulse: 100 100  Resp: 18 16  Temp:  98 F (36.7 C)  SpO2: 96% 90%    DISCHARGE MEDICATION: Allergies as of 11/29/2019   No Known Allergies     Medication List    STOP taking these medications   aspirin EC 81 MG tablet   calcium carbonate 500 MG chewable tablet Commonly known as: TUMS - dosed in mg elemental calcium   dexamethasone 2 MG tablet Commonly known as: DECADRON   Fish Oil 1200 MG Caps   metFORMIN 1000 MG tablet Commonly known as: GLUCOPHAGE     TAKE these medications   atropine 1 % ophthalmic solution Place 2 drops under the tongue 3 (three) times daily as needed (excessive secretion).   famotidine 20 MG tablet Commonly known as: PEPCID Take 1 tablet (20 mg total) by mouth at bedtime.   ipratropium-albuterol 0.5-2.5 (3) MG/3ML Soln Commonly known as: DUONEB Take 3 mLs by nebulization every 2 (two) hours as needed.   levETIRAcetam 1000 MG tablet Commonly known as: KEPPRA Take 1 tablet (1,000 mg total) by mouth 2 (two) times daily.   LORazepam 0.5 MG tablet Commonly known as: Ativan Take 1 tablet (0.5 mg total) by mouth every 8 (eight) hours as needed for anxiety.   mirtazapine 15 MG disintegrating tablet Commonly known as: REMERON SOL-TAB Take 1 tablet (15 mg total) by mouth at bedtime.   oxyCODONE 5 MG immediate release tablet Commonly known as: Oxy IR/ROXICODONE Take 1 tablet (5 mg total) by mouth every 4 (four) hours as needed for moderate pain or severe pain. What changed: when to take this   prochlorperazine 10 MG tablet Commonly known as:  COMPAZINE Take 1 tablet (10 mg total) by mouth every 6 (six) hours as needed for nausea or vomiting.   QUEtiapine 25 MG tablet Commonly known as: SEROQUEL Take 0.5 tablets (12.5 mg total) by mouth at bedtime.   senna-docusate 8.6-50 MG tablet Commonly known as: Senokot-S Take 1 tablet by mouth at bedtime. What changed: when to take this      No Known Allergies   The results of significant diagnostics from this hospitalization (including imaging, microbiology, ancillary and laboratory) are listed below for reference.    Significant Diagnostic Studies: CT Abdomen Pelvis Wo Contrast  Result Date: 11/25/2019 CLINICAL DATA:  Abdominal distention. Dark tarry stools. Metastatic non-small cell lung cancer. EXAM: CT ABDOMEN AND PELVIS WITHOUT CONTRAST TECHNIQUE: Multidetector CT imaging of the abdomen and pelvis was performed following the standard protocol without IV contrast. COMPARISON:  CT scan dated 09/02/2019 and PET-CT dated 11/12/2019 FINDINGS: Lower chest: Increased loculated moderate right pleural effusion with new extensive infiltrate and/or tumor  in the right lower lobe with marked narrowing and obstruction of lower lobe bronchi. New focal area of infiltrate or tumor at the right lung base. Progressive small metastatic pulmonary nodules bilaterally. Heart size is normal. Aortic atherosclerosis. Hepatobiliary: No focal liver abnormality is seen. No gallstones, gallbladder wall thickening, or biliary dilatation. Pancreas: Unremarkable. No pancreatic ductal dilatation or surrounding inflammatory changes. Spleen: Normal in size without focal abnormality. Adrenals/Urinary Tract: Adrenal glands are unremarkable. Kidneys are normal except for an exophytic 2.8 cm cyst on the mid left kidney, unchanged. Bladder is unremarkable. Stomach/Bowel: Small hiatal hernia. The stomach is otherwise normal. Slight edema in the mucosa of the terminal ileum best seen on image 88 of series 2. The small bowel  otherwise appears normal. There are a few diverticula in the proximal sigmoid portion of the colon. No dilated colon. The appendix is not discretely identified. Vascular/Lymphatic: Extensive aortic atherosclerosis. Saccular 3.7 cm abdominal aortic aneurysm, essentially unchanged since the prior study. 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 Reproductive: Prostate is unremarkable. Other: No abdominal wall hernia or abnormality. No abdominopelvic ascites. Musculoskeletal: No acute abnormalities. IMPRESSION: 1. Slight edema in the mucosa of the distal ileum. No other acute abnormality of the abdomen or pelvis. 2. Increased loculated moderate right pleural effusion with new extensive infiltrate and/or tumor in the right lower lobe with marked new narrowing and obstruction of the lower lobe bronchi. 3. Progressive small metastatic pulmonary nodules bilaterally. 4. Stable saccular abdominal aortic aneurysm. Aortic Atherosclerosis (ICD10-I70.0). Electronically Signed   By: Lorriane Shire M.D.   On: 11/25/2019 16:43   CT Angio Chest PE W and/or Wo Contrast  Result Date: 11/28/2019 CLINICAL DATA:  PE suspected, positive D-dimer, extreme dyspnea, stage IV lung and brain cancer EXAM: CT ANGIOGRAPHY CHEST WITH CONTRAST TECHNIQUE: Multidetector CT imaging of the chest was performed using the standard protocol during bolus administration of intravenous contrast. Multiplanar CT image reconstructions and MIPs were obtained to evaluate the vascular anatomy. CONTRAST:  129mL OMNIPAQUE IOHEXOL 350 MG/ML SOLN COMPARISON:  CT 09/02/2019 FINDINGS: Cardiovascular: Satisfactory opacification the pulmonary arteries to the segmental level. No pulmonary artery filling defects are identified. Central pulmonary arteries are normal caliber. Cardiac size at the upper limits of normal. Coronary artery calcifications are present. No pericardial  effusion. Calcifications present on the aortic leaflets with a calcified but normal caliber thoracic aorta. Additional atherosclerotic plaque noted at the origins of the great vessels which branch normally from the aortic arch and within the upper abdominal aorta which demonstrates some irregular eccentric mural plaque, similar to comparison study. Major venous structures are unremarkable. Mediastinum/Nodes: Extensive mediastinal and hilar adenopathy is noted, largest nodes include a 19 mm right paratracheal node (4/56) a 16 mm left hilar node (4/66) and a 27 mm subcarinal node (4/76). Coronal narrowing of the trachea compatible with obstructive pulmonary disease. There are extensive secretions in the airways including near complete opacification of the right lower lobe airways and partial opacification of the right middle lobe airways. Esophagus appears diffusely thickened. Thyroid gland and thoracic inlet are unremarkable. Lungs/Pleura: There is a background of centrilobular and paraseptal emphysematous changes with superimposed areas of patchy ground-glass and consolidative opacity within the lungs. Multifocal nodular opacities within both lungs have increased from prior compatible worsening metastatic disease, largest lesions include a an 11 mm in nodule in the superior segment left lower lobe (5/67), a spiculated 16 mm nodule in the medial  right lower lobe (6/89) and a 14 mm nodule in the periphery of the superior segment right upper lobe (6/37). Moderate to large right pleural effusion. Small left effusion. Essentially complete atelectatic collapse of the right middle lobe and subtotal atelectatic collapse of the right lower lobe, the latter of which demonstrates a relative hypoattenuating appearance which may be related to decreased relative density though underlying infection could present with a similar appearance. Interstitial, tree-in-bud and ground-glass opacities are present in the remaining aerated  portions of the right lower lobe superior segment. Upper Abdomen: No acute abnormalities present in the visualized portions of the upper abdomen. Mild bilateral symmetric perinephric stranding, a nonspecific finding though may correlate with either age or decreased renal function. Appearance similar to prior. Musculoskeletal: Multilevel degenerative changes are present in the imaged portions of the spine. Stable compression deformity at T12. No new suspicious lesions. Remote posttraumatic deformity of the left eighth through tenth ribs. Circumferential body wall edema and paucity of subcutaneous fat, correlate with cachexia. Review of the MIP images confirms the above findings. IMPRESSION: 1. No evidence of acute pulmonary artery embolism. 2. Extensive secretions in the airways including near complete opacification of the right lower lobe airways and partial opacification of the right middle lobe airways. Complete collapse of the right middle lobe and subtotal collapse of right lower lobe. Differential attenuation of the atelectatic lung may be related to the degree atelectasis versus underlying infection in the lower lobe. Additional infectious/inflammatory opacities present in the remaining aerated portions of the superior segment right lower lobe. 3. Multifocal nodular opacities within both lungs have increased from prior study compatible with worsening metastatic disease. 4. Extensive mediastinal and hilar adenopathy consistent with a combination of metastatic disease and reactive adenopathy. 5. Moderate to large right pleural effusion.  Small left effusion. 6. Extensive body wall edema and paucity of subcutaneous fat, correlate with edema on a background of cachexia. 7. Aortic Atherosclerosis (ICD10-I70.0). Electronically Signed   By: Lovena Le M.D.   On: 11/28/2019 04:06   NM PET Image Initial (PI) Skull Base To Thigh  Result Date: 11/12/2019 CLINICAL DATA:  Subsequent treatment strategy for metastatic  non-small cell lung cancer. EXAM: NUCLEAR MEDICINE PET SKULL BASE TO THIGH TECHNIQUE: 7.3 mCi F-18 FDG was injected intravenously. Full-ring PET imaging was performed from the skull base to thigh after the radiotracer. CT data was obtained and used for attenuation correction and anatomic localization. Fasting blood glucose: 138 mg/dl COMPARISON:  Multiple exams, including CT scan 09/02/2019 FINDINGS: Mediastinal blood pool activity: SUV max 2.1 Liver activity: SUV max NA NECK: Left level II and right level III and V hypermetabolic adenopathy observed. A right level III lymph node measuring 1.1 cm in short axis on image 39/4 has a maximum SUV of 8.7. Activity along paraspinal musculature in the neck is likely benign. Incidental CT findings: Bilateral common carotid atherosclerotic calcification. CHEST: Extensive hypermetabolic supraclavicular, prevascular, paratracheal, AP window, hilar, subcarinal, paraesophageal adenopathy. Index AP window lymph node measures 1.9 cm in short axis on image 73/4 (formerly same) with maximum SUV 9.9. Numerous scattered hypermetabolic metastatic pulmonary nodules are present. These are enlarged from 09/02/2019 and some have shaggy borders. An index right lower lobe nodule measures 2.4 by 1.6 cm on image 39/8 (formerly 1.2 by 1.2 cm) and has a maximum SUV of 9.6. Trace right pleural effusion, nonspecific with regard to malignancy. Incidental CT findings: Coronary, aortic arch, and branch vessel atherosclerotic vascular disease. Mild cardiomegaly. Small pericardial effusion. Paraseptal emphysema. ABDOMEN/PELVIS: Hypermetabolic  retrocrural and para-aortic adenopathy is identified. A left retrocrural node measuring 0.8 cm in short axis on image 119/4 (formerly 0.4 cm) has maximum SUV of 9.7. Small hepatic metastatic lesions are present. An approximately 1.0 by 0.8 cm lesion at the junction of the right and left hepatic lobes has maximum SUV of 6.6. A mesenteric lymph node with short axis  diameter of 0.7 cm on image 126/4 (not previously visible) has a maximum SUV of 4.4, compatible with malignancy. Scattered physiologic activity in bowel. Incidental CT findings: Aortoiliac atherosclerotic vascular disease. Small saccular aneurysm or chronic penetrating ulcer in the left posterior abdominal aorta on image 134/4. Abdominal aortic aneurysm with chronic mural thrombus common 3.6 cm in diameter. Descending and sigmoid colon diverticulosis. SKELETON: There are few scattered osseous metastatic lesions including the sacrum, left iliac bone, and right femoral head. Index right upper sacral lesion appears lytic, measuring 1.4 cm in long axis with maximum SUV 14.0. The right femoral head lesion is only about 1.2 cm in diameter, not currently a fracture risk, with maximum SUV 6.8. Incidental CT findings: none IMPRESSION: 1. Progressive widespread metastatic malignancy, with enlarging bilateral pulmonary nodules and worsening adenopathy in the chest and abdomen. Hypermetabolic lymph nodes in the neck are present and there are a few osseous metastatic lesions primarily in the bony pelvis and right proximal femur, as well as several small new metastatic lesions in the liver. 2. Infrarenal abdominal aortic aneurysm 3.6 cm in diameter. Recommend followup by [place] in 2 years. This recommendation follows ACR consensus guidelines: White Paper of the ACR Incidental Findings Committee II on Vascular Findings. J Am Coll Radiol 2013; 10:789-794. 3. Other imaging findings of potential clinical significance: Trace right pleural effusion. Aortic Atherosclerosis (ICD10-I70.0) and Emphysema (ICD10-J43.9). Coronary atherosclerosis. Mild cardiomegaly. Small pericardial effusion. 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 Descending and sigmoid colon diverticulosis. Electronically Signed   By: Van Clines M.D.   On: 11/12/2019 13:06   DG Chest Port 1 View  Result Date: 11/28/2019 CLINICAL DATA:   Increasing shortness of breath, metastatic lung cancer EXAM: PORTABLE CHEST 1 VIEW COMPARISON:  11/12/2019, 11/25/2019 FINDINGS: Two frontal views of the chest demonstrate a stable enlarged cardiac silhouette. Bilateral hilar fullness consistent with metastatic adenopathy as per recent PET scan. Persistent right pleural effusion. Diffuse nodularity throughout the lungs compatible with known bilateral pulmonary metastases. Right basilar atelectasis again noted. No pneumothorax. No acute fractures. IMPRESSION: 1. Continued right pleural effusion. 2. Widespread pulmonary metastatic disease, with bilateral pulmonary nodules and mediastinal adenopathy. Please refer to recent PET scan. Electronically Signed   By: Randa Ngo M.D.   On: 11/28/2019 01:48   DG Chest Port 1 View  Result Date: 11/25/2019 CLINICAL DATA:  Weakness, decreased appetite EXAM: PORTABLE CHEST 1 VIEW COMPARISON:  11/12/2019 FINDINGS: Stable cardiomediastinal contours. Calcific aortic knob. Elevation of the right hemidiaphragm appears new from prior. Streaky right basilar opacity, which could reflect atelectasis. Known bilateral pulmonary nodules are only faintly visible radiographically. The right costophrenic angle was excluded from the field of view. No pneumothorax. IMPRESSION: New elevation of the right hemidiaphragm with streaky right basilar opacity, which could reflect pleural effusion and/or atelectasis. Electronically Signed   By: Davina Poke D.O.   On: 11/25/2019 13:25    Microbiology: Recent Results (from the past 240 hour(s))  Respiratory Panel by RT PCR (Flu A&B, Covid) - Nasopharyngeal Swab     Status: None   Collection Time: 11/25/19  2:10 PM   Specimen: Nasopharyngeal Swab  Result Value Ref Range Status   SARS Coronavirus 2 by RT PCR NEGATIVE NEGATIVE Final    Comment: (NOTE) SARS-CoV-2 target nucleic acids are NOT DETECTED. The SARS-CoV-2 RNA is generally detectable in upper respiratoy specimens during the  acute phase of infection. The lowest concentration of SARS-CoV-2 viral copies this assay can detect is 131 copies/mL. A negative result does not preclude SARS-Cov-2 infection and should not be used as the sole basis for treatment or other patient management decisions. A negative result may occur with  improper specimen collection/handling, submission of specimen other than nasopharyngeal swab, presence of viral mutation(s) within the areas targeted by this assay, and inadequate number of viral copies (<131 copies/mL). A negative result must be combined with clinical observations, patient history, and epidemiological information. The expected result is Negative. Fact Sheet for Patients:  PinkCheek.be Fact Sheet for Healthcare Providers:  GravelBags.it This test is not yet ap proved or cleared by the [place] FDA and  has been authorized for detection and/or diagnosis of SARS-CoV-2 by FDA under an Emergency Use Authorization (EUA). This EUA will remain  in effect (meaning this test can be used) for the duration of the COVID-19 declaration under Section 564(b)(1) of the Act, 21 U.S.C. section 360bbb-3(b)(1), unless the authorization is terminated or revoked sooner.    Influenza A by PCR NEGATIVE NEGATIVE Final   Influenza B by PCR NEGATIVE NEGATIVE Final    Comment: (NOTE) The Xpert Xpress SARS-CoV-2/FLU/RSV assay is intended as an aid in  the diagnosis of influenza from Nasopharyngeal swab specimens and  should not be used as a sole basis for treatment. Nasal washings and  aspirates are unacceptable for Xpert Xpress SARS-CoV-2/FLU/RSV  testing. Fact Sheet for Patients: PinkCheek.be Fact Sheet for Healthcare Providers: GravelBags.it This test is not yet approved or cleared by the [place] FDA and  has been authorized for detection and/or diagnosis of SARS-CoV-2  by  FDA under an Emergency Use Authorization (EUA). This EUA will remain  in effect (meaning this test can be used) for the duration of the  Covid-19 declaration under Section 564(b)(1) of the Act, 21  U.S.C. section 360bbb-3(b)(1), unless the authorization is  terminated or revoked. Performed at [facility], [place] [address], [place], [place] [address]   SARS CORONAVIRUS 2 (TAT 6-24 HRS) Nasopharyngeal Nasopharyngeal Swab     Status: None   Collection Time: 11/28/19  5:02 AM   Specimen: Nasopharyngeal Swab  Result Value Ref Range Status   SARS Coronavirus 2 NEGATIVE NEGATIVE Final    Comment: (NOTE) SARS-CoV-2 target nucleic acids are NOT DETECTED. The SARS-CoV-2 RNA is generally detectable in upper and lower respiratory specimens during the acute phase of infection. Negative results do not preclude SARS-CoV-2 infection, do not rule out co-infections with other pathogens, and should not be used as the sole basis for treatment or other patient management decisions. Negative results must be combined with clinical observations, patient history, and epidemiological information. The expected result is Negative. Fact Sheet for Patients: SugarRoll.be Fact Sheet for Healthcare Providers: https://www.woods-mathews.com/ This test is not yet approved or cleared by the [place] FDA and  has been authorized for detection and/or diagnosis of SARS-CoV-2 by FDA under an Emergency Use Authorization (EUA). This EUA will remain  in effect (meaning this test can be used) for the duration of the COVID-19 declaration under Section 56 4(b)(1) of the Act, 21 U.S.C. section 360bbb-3(b)(1), unless the authorization is terminated or revoked sooner. Performed at [facility],  [address], [place], [place] [address]      Labs: CBC: Recent Labs  Lab 11/25/19 1302 11/26/19 0452 11/27/19 0509 11/28/19 0202 11/29/19 0439  WBC  5.4 3.8* 8.4 6.6 6.5  NEUTROABS  --   --  5.6 4.4  --   HGB 11.5* 11.1* 11.1* 10.6* 11.0*  HCT 32.7* 31.7* 32.5* 31.6* 32.1*  MCV 87.2 86.8 87.4 87.5 87.5  PLT 222 222 258 214 027   Basic Metabolic Panel: Recent Labs  Lab 11/25/19 1302 11/26/19 0452 11/27/19 0509 11/28/19 0202 11/29/19 0439  NA 125* 124* 127* 128* 133*  K 3.6 3.4* 4.2 4.6 4.3  CL 92* 91* 95* 96* 98  CO2 21* 20* 24 24 26   GLUCOSE 41* 269* 142* 203* 105*  BUN 27* 30* 33* 29* 25*  CREATININE 1.14 1.34* 1.39* 1.38* 1.22  CALCIUM 7.0* 7.4* 7.7* 7.7* 7.7*  MG 1.9  --  2.0  --   --   PHOS 3.6  --  3.3  --   --    Liver Function Tests: Recent Labs  Lab 11/25/19 1302 11/26/19 0452 11/27/19 0509 11/28/19 0202 11/29/19 0439  AST 171* 113* 209* 102* 61*  ALT 129* 115* 177* 149* 124*  ALKPHOS 882* 802* 969* 844* 617*  BILITOT 8.4* 8.0* 7.8* 5.4* 3.5*  PROT 4.3* 4.6* 4.9* 4.8* 4.6*  ALBUMIN 1.8* 1.8* 2.0* 2.0* 2.0*   No results for input(s): LIPASE, AMYLASE in the last 168 hours. Recent Labs  Lab 11/25/19 1758 11/28/19 0202  AMMONIA 35 35   Cardiac Enzymes: No results for input(s): CKTOTAL, CKMB, CKMBINDEX, TROPONINI in the last 168 hours. BNP (last 3 results) Recent Labs    11/28/19 0202  BNP 965.1*   CBG: Recent Labs  Lab 11/28/19 1153 11/28/19 1641 11/28/19 2108 11/29/19 0718 11/29/19 1148  GLUCAP 214* 262* 166* 116* 280*    Time spent: 35 minutes  Signed:  Berle Mull  [facility] 11/29/2019 5:25 PM

## 2019-12-02 ENCOUNTER — Telehealth: Payer: Self-pay | Admitting: Medical Oncology

## 2019-12-02 NOTE — Telephone Encounter (Signed)
Geoffrey Benjamin, please see Diane's note. We can discontinue brain MRI surveillance. Thanks, Bryson Ha

## 2019-12-02 NOTE — Telephone Encounter (Signed)
Wife stated pt under Hospice care.

## 2019-12-03 ENCOUNTER — Inpatient Hospital Stay: Payer: Medicare Other

## 2019-12-03 ENCOUNTER — Inpatient Hospital Stay: Payer: Medicare Other | Admitting: Internal Medicine

## 2019-12-04 ENCOUNTER — Emergency Department (HOSPITAL_COMMUNITY)

## 2019-12-04 ENCOUNTER — Other Ambulatory Visit: Payer: Self-pay

## 2019-12-04 ENCOUNTER — Emergency Department (HOSPITAL_COMMUNITY)
Admission: EM | Admit: 2019-12-04 | Discharge: 2019-12-05 | Disposition: A | Discharge status: Home / Self Care | Attending: Emergency Medicine | Admitting: Emergency Medicine

## 2019-12-04 ENCOUNTER — Encounter (HOSPITAL_COMMUNITY): Payer: Self-pay

## 2019-12-04 DIAGNOSIS — Z79899 Other long term (current) drug therapy: Secondary | ICD-10-CM | POA: Insufficient documentation

## 2019-12-04 DIAGNOSIS — F17210 Nicotine dependence, cigarettes, uncomplicated: Secondary | ICD-10-CM | POA: Diagnosis not present

## 2019-12-04 DIAGNOSIS — I110 Hypertensive heart disease with heart failure: Secondary | ICD-10-CM | POA: Diagnosis not present

## 2019-12-04 DIAGNOSIS — R0602 Shortness of breath: Secondary | ICD-10-CM | POA: Diagnosis present

## 2019-12-04 DIAGNOSIS — I509 Heart failure, unspecified: Secondary | ICD-10-CM | POA: Insufficient documentation

## 2019-12-04 DIAGNOSIS — C3490 Malignant neoplasm of unspecified part of unspecified bronchus or lung: Secondary | ICD-10-CM | POA: Diagnosis not present

## 2019-12-04 MED ORDER — SODIUM CHLORIDE 0.9 % IV BOLUS (SEPSIS)
500.0000 mL | Freq: Once | INTRAVENOUS | Status: AC
  Administered 2019-12-04: 500 mL via INTRAVENOUS

## 2019-12-04 NOTE — ED Triage Notes (Signed)
Pt from home, normally on 6 liters [place] at home, pt complains of short of breath and low O2 sat for two days  Known to have lung cancer and hospice nurse was contacted and she wanted him to come here for evaluation

## 2019-12-04 NOTE — ED Provider Notes (Signed)
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  He is unable to tell me what his saturations have been at home.  He denies any known fever or productive cough.  Denies any new pain.  He received first dose of Keytruda on 11/06/2019.  He is status post brain radiation.  He is receiving hospice services at home daily.  Patient has a difficult time discussing goals of care.  States he is not a DNR.   CTA 11/28/19:  IMPRESSION: 1. No evidence of acute pulmonary artery embolism. 2. Extensive secretions in the airways including near complete opacification of the right lower lobe airways and partial opacification of the right middle lobe airways. Complete collapse of the right middle lobe and subtotal collapse of right lower lobe. Differential attenuation of the atelectatic lung may be related to the degree atelectasis versus underlying infection in the lower lobe. Additional infectious/inflammatory opacities present in the remaining aerated portions of the superior segment right lower lobe. 3. Multifocal nodular opacities within both lungs have increased from prior study compatible with worsening metastatic disease. 4. Extensive mediastinal and hilar adenopathy consistent with a combination of metastatic disease and reactive adenopathy. 5. Moderate to large right pleural effusion.  Small left effusion. 6. Extensive body wall edema and paucity of subcutaneous fat, correlate with edema on a background of cachexia. 7. Aortic Atherosclerosis (ICD10-I70.0).   ROS: See HPI, limited as patient is a poor historian Constitutional: no fever  Eyes: no drainage  ENT: no runny nose    Cardiovascular:  no chest pain  Resp:  SOB  GI: no vomiting GU: no dysuria Integumentary: no rash  Allergy: no hives  Musculoskeletal: no leg swelling  Neurological: no slurred speech ROS otherwise negative  PAST MEDICAL HISTORY/PAST SURGICAL HISTORY:  Past Medical History:  Diagnosis Date  . Aortic stenosis 09/13/2018  . CAP (community acquired pneumonia) 09/13/2018  . CHF (congestive heart failure) ([place])   . Difficulty walking   . DM (diabetes mellitus) ([place])   . GERD 06/04/2009   Qualifier: Diagnosis of  By: Henrene Pastor MD, Docia Chuck   . Hyperlipidemia 09/13/2018  . Hypertension   . Loss of weight 09/29/2008   Qualifier: Diagnosis of  By: Bobby Rumpf CMA (AAMA), Patty    . Non-small cell carcinoma of right lung, stage 4 ([place])   . PERSONAL HX COLONIC POLYPS 06/04/2009   Qualifier: Diagnosis of  By: Henrene Pastor MD, [place] PAIN 09/29/2008   Qualifier: Diagnosis of  By: Bobby Rumpf CMA (AAMA), Patty    . Tachycardia   . Tobacco use 09/13/2018  . Volume overload 09/13/2018    MEDICATIONS:  Prior to Admission medications   Medication Sig Start Date End Date Taking? Authorizing Provider  atropine 1 % ophthalmic solution Place 2 drops under the tongue 3 (three) times daily as needed (excessive secretion). 11/29/19  Yes Lavina Hamman, MD  famotidine (PEPCID) 20 MG tablet Take 1 tablet (20 mg total) by mouth at bedtime. 10/15/19  Yes Angiulli, Lavon Paganini, PA-C  ipratropium-albuterol (DUONEB) 0.5-2.5 (3) MG/3ML SOLN Take 3 mLs by nebulization every 2 (two) hours as needed. Patient taking differently: Take 3 mLs by nebulization every 2 (two) hours as  needed (sob).  11/29/19  Yes Lavina Hamman, MD  levETIRAcetam (KEPPRA) 1000 MG tablet Take 1 tablet (1,000 mg total) by mouth 2 (two) times daily. 10/15/19  Yes Angiulli, Lavon Paganini, PA-C  LORazepam (ATIVAN) 0.5 MG tablet Take 1 tablet (0.5 mg total) by mouth every 8 (eight) hours as needed for anxiety. 11/29/19 11/28/20 Yes Lavina Hamman, MD  mirtazapine  (REMERON SOL-TAB) 15 MG disintegrating tablet Take 1 tablet (15 mg total) by mouth at bedtime. 10/15/19  Yes Angiulli, Lavon Paganini, PA-C  oxyCODONE (OXY IR/ROXICODONE) 5 MG immediate release tablet Take 1 tablet (5 mg total) by mouth every 4 (four) hours as needed for moderate pain or severe pain. 11/29/19  Yes Lavina Hamman, MD  prochlorperazine (COMPAZINE) 10 MG tablet Take 1 tablet (10 mg total) by mouth every 6 (six) hours as needed for nausea or vomiting. 10/30/19  Yes Curt Bears, MD  QUEtiapine (SEROQUEL) 25 MG tablet Take 0.5 tablets (12.5 mg total) by mouth at bedtime. Patient taking differently: Take 12 mg by mouth at bedtime as needed (sleep).  10/15/19  Yes Angiulli, Lavon Paganini, PA-C  senna-docusate (SENOKOT-S) 8.6-50 MG tablet Take 1 tablet by mouth at bedtime. 11/29/19 11/28/20 Yes Lavina Hamman, MD    ALLERGIES:  No Known Allergies  SOCIAL HISTORY:  Social History   Tobacco Use  . Smoking status: Current Every Day Smoker    Packs/day: 1.50    Types: Cigarettes  . Smokeless tobacco: Never Used  . Tobacco comment: SMOKED FOR 60 PLUS YEARS  Substance Use Topics  . Alcohol use: Yes    Comment: occas    FAMILY HISTORY: Family History  Problem Relation Age of Onset  . Kidney disease Father   . Stroke Mother     EXAM: BP 97/60   Pulse (!) 117   Temp (!) 97.5 F (36.4 C) (Oral)   Resp 20   SpO2 95%  CONSTITUTIONAL: Alert and oriented and responds appropriately to questions.  Elderly, thin, chronically ill-appearing HEAD: Normocephalic EYES: Conjunctivae clear, pupils appear equal, EOM appear intact ENT: normal nose; moist mucous membranes NECK: Supple, normal ROM CARD: Regular and tachycardic; S1 and S2 appreciated; no murmurs, no clicks, no rubs, no gallops RESP: Patient is tachypneic.  He has diminished aeration at his bases bilaterally.  I do not appreciate rhonchi, wheezing or rales.  He is able to speak short sentences.  Sats 92 to 93% on 6 L nasal  cannula. ABD/GI: Normal bowel sounds; non-distended; soft, non-tender, no rebound, no guarding, no peritoneal signs, no hepatosplenomegaly BACK:  The back appears normal EXT: Normal ROM in all joints; no deformity noted, no edema; no cyanosis, no calf tenderness or calf swelling SKIN: Normal color for age and race; warm; no rash on exposed skin NEURO: Moves all extremities equally PSYCH: The patient's mood and manner are appropriate.   MEDICAL DECISION MAKING: Patient here with increasing shortness of breath.  Has history of extensive lung cancer on 6 L of oxygen with home hospice services.  When attempting to discuss goals of care with patient, patient has very difficult time discussing what he would want done today and whether or not he would be a DNR/DNI.  He is aware that if he is admitted to the hospital, he could potentially lose his hospice services at home.  He would like continued work-up.  He is borderline hypotensive here and tachycardic.  Rectal temp was normal here.  Will give gentle IVF, obtain labs, urine.  Patient had a CTA of his chest 8 days ago that showed no pulmonary embolus.  He had extensive secretions in the airways with near complete opacification of the right lower lobe airways and partial opacification of the right middle lobe airways with complete collapse of the right middle lobe and subtotal collapse of the right lower lobe.  Multifocal nodular opacities within both lungs had increased compatible with worsening metastatic disease.  He had a moderate to large right pleural effusion at that time and a small left pleural effusion.  I do not feel CT needs to be repeated today given this was done 8 days ago.  He was admitted to the hospital 11/28/2019.  During his hospitalization, patient opted for palliative care and was discharged with hospice services.  He did not receive antibiotics during this admission.  He now tells me that he wants "everything done to make me feel  better".  We discussed comfort care measures but patient states that he would want more work-up at this time.  We will discuss with his hospice nurse.  I did discuss with patient that given his abnormal vitals, this could represent sepsis.  We discussed the worsening of his metastatic disease seen on recent imaging.  Discussed that I feel that this is normal progression of his disease process and that we can make him comfortable here in the emergency department.  Low suspicion for PE given CTA 8 days ago was negative for PE although this is on the differential.  This was also discussed with patient.  ED PROGRESS: Discussed with Otila Kluver with [facility] at [phone] who states that he was satting in low 80s on his Vesper.  Per his chart, his goals are comfort care only.  Hospice services initiated after last discharge on 11/29/19.  He is on 5mg  oxycodone every 4 hours.  She states that patient wanted to come to the emergency department last night as well but they were able to talk him down and keep him at home and keep him comfortable.  States that he was insistent on coming to the emergency department tonight.  1:40 AM  Pt's vital signs significantly improved after small dose of IV morphine.  He states he is feeling much better.  He is no longer tachypneic.  Work-up here reveals worsening metastatic disease versus infection versus edema as seen previously.  He does not appear to be septic here and I think that his symptoms, vitals, presentation are more consistent with severe metastatic lung cancer and I do not feel diuresis or antibiotics will be significantly helpful in establishing increasing comfort for this patient.  We did discuss goals of care again and it seems at this time comfort is his biggest concern.  I feel he can be discharged back to his home with continued hospice services and he is comfortable with this plan.  1:45 AM  Updated patient's wife Brenyn Petrey at [phone].  She states that she  needs ambulance services to bring patient home.  Will call [facility].  I also updated Otila Kluver with hospice services that patient may require increasing pain medicine at home to help with his air hunger.  They will continue to monitor at home.  Kelsie Kramp Jeanpaul was evaluated in Emergency Department on 12/04/2019 for the symptoms described in the history of present illness. He was evaluated in the context of the global COVID-19 pandemic, which necessitated consideration that the patient might be at risk for infection with the SARS-CoV-2 virus that causes COVID-19. Institutional protocols  and algorithms that pertain to the evaluation of patients at risk for COVID-19 are in a state of rapid change based on information released by regulatory bodies including the CDC and federal and state organizations. These policies and algorithms were followed during the patient's care in the ED.  Patient was seen wearing N95, face shield, gloves.    Silvio Sausedo, Delice Bison, DO 12/05/19 [phone]

## 2019-12-05 LAB — CBC WITH DIFFERENTIAL/PLATELET
Abs Immature Granulocytes: 0.03 10*3/uL (ref 0.00–0.07)
Basophils Absolute: 0 10*3/uL (ref 0.0–0.1)
Basophils Relative: 1 %
Eosinophils Absolute: 0 10*3/uL (ref 0.0–0.5)
Eosinophils Relative: 0 %
HCT: 29.7 % — ABNORMAL LOW (ref 39.0–52.0)
Hemoglobin: 9.7 g/dL — ABNORMAL LOW (ref 13.0–17.0)
Immature Granulocytes: 1 %
Lymphocytes Relative: 19 %
Lymphs Abs: 1 10*3/uL (ref 0.7–4.0)
MCH: 29.9 pg (ref 26.0–34.0)
MCHC: 32.7 g/dL (ref 30.0–36.0)
MCV: 91.7 fL (ref 80.0–100.0)
Monocytes Absolute: 0.8 10*3/uL (ref 0.1–1.0)
Monocytes Relative: 16 %
Neutro Abs: 3.3 10*3/uL (ref 1.7–7.7)
Neutrophils Relative %: 63 %
Platelets: 168 10*3/uL (ref 150–400)
RBC: 3.24 MIL/uL — ABNORMAL LOW (ref 4.22–5.81)
RDW: 17.2 % — ABNORMAL HIGH (ref 11.5–15.5)
WBC: 5.1 10*3/uL (ref 4.0–10.5)
nRBC: 0 % (ref 0.0–0.2)

## 2019-12-05 LAB — URINALYSIS, ROUTINE W REFLEX MICROSCOPIC
Bilirubin Urine: NEGATIVE
Glucose, UA: >=500 mg/dL — AB
Ketones, ur: NEGATIVE mg/dL
Leukocytes,Ua: NEGATIVE
Nitrite: NEGATIVE
Protein, ur: NEGATIVE mg/dL
Specific Gravity, Urine: 1.016 (ref 1.005–1.030)
pH: 5 (ref 5.0–8.0)

## 2019-12-05 LAB — COMPREHENSIVE METABOLIC PANEL
ALT: 123 U/L — ABNORMAL HIGH (ref 0–44)
AST: 68 U/L — ABNORMAL HIGH (ref 15–41)
Albumin: 2.1 g/dL — ABNORMAL LOW (ref 3.5–5.0)
Alkaline Phosphatase: 674 U/L — ABNORMAL HIGH (ref 38–126)
Anion gap: 9 (ref 5–15)
BUN: 18 mg/dL (ref 8–23)
CO2: 27 mmol/L (ref 22–32)
Calcium: 7.6 mg/dL — ABNORMAL LOW (ref 8.9–10.3)
Chloride: 98 mmol/L (ref 98–111)
Creatinine, Ser: 1.03 mg/dL (ref 0.61–1.24)
GFR calc Af Amer: >60 mL/min (ref >60)
GFR calc non Af Amer: >60 mL/min (ref >60)
Glucose, Bld: 305 mg/dL — ABNORMAL HIGH (ref 70–99)
Potassium: 3.2 mmol/L — ABNORMAL LOW (ref 3.5–5.1)
Sodium: 134 mmol/L — ABNORMAL LOW (ref 135–145)
Total Bilirubin: 3.3 mg/dL — ABNORMAL HIGH (ref 0.3–1.2)
Total Protein: 4.8 g/dL — ABNORMAL LOW (ref 6.5–8.1)

## 2019-12-05 LAB — BRAIN NATRIURETIC PEPTIDE: B Natriuretic Peptide: 829.5 pg/mL — ABNORMAL HIGH (ref 0.0–100.0)

## 2019-12-05 LAB — LACTIC ACID, PLASMA: Lactic Acid, Venous: 2.2 mmol/L (ref 0.5–1.9)

## 2019-12-05 MED ORDER — MORPHINE SULFATE (PF) 2 MG/ML IV SOLN
2.0000 mg | Freq: Once | INTRAVENOUS | Status: AC
  Administered 2019-12-05: 01:00:00 2 mg via INTRAVENOUS
  Filled 2019-12-05: qty 1

## 2019-12-05 NOTE — ED Notes (Signed)
[facility] called paperwork printed.

## 2019-12-05 NOTE — ED Notes (Signed)
Called lab and spoke with Ubaldo Glassing who advised she will add on BNP.

## 2019-12-05 NOTE — ED Notes (Signed)
Pt provided with water

## 2019-12-10 ENCOUNTER — Telehealth: Payer: Self-pay | Admitting: Medical Oncology

## 2019-12-10 NOTE — Telephone Encounter (Signed)
Pt passed away on 2019-12-23.

## 2019-12-18 ENCOUNTER — Ambulatory Visit: Payer: Medicare Other | Admitting: Internal Medicine

## 2019-12-18 ENCOUNTER — Other Ambulatory Visit: Payer: Medicare Other

## 2019-12-18 ENCOUNTER — Ambulatory Visit: Payer: Medicare Other

## 2019-12-24 ENCOUNTER — Ambulatory Visit: Payer: Medicare Other

## 2019-12-24 ENCOUNTER — Ambulatory Visit: Payer: Medicare Other | Admitting: Internal Medicine

## 2019-12-24 ENCOUNTER — Other Ambulatory Visit: Payer: Medicare Other

## 2019-12-25 ENCOUNTER — Encounter: Payer: Self-pay | Admitting: Radiation Therapy

## 2020-01-02 DEATH — deceased

## 2020-02-13 NOTE — Progress Notes (Signed)
  Radiation Oncology         [phone] ________________________________  Name: Geoffrey Benjamin MRN: [id number]  Date: 09/19/2019  DOB: 1941/06/22  End of Treatment Note  Diagnosis:   Brain metastasis     Indication for treatment:  palliative       Radiation treatment dates:   09/19/19  Site/dose:    1.  PTVs 1-5 target was treated using 6 Arcs to a prescription dose of 20 Gy. ExacTrac Snap verification was performed for each couch angle.  2.  Concurrently, PTV6 target was treated using 6 Arcs to a prescription dose of 18 Gy. ExacTrac Snap verification was performed for each couch angle.  Narrative: The patient tolerated radiation treatment well.   There were no signs of acute toxicity after treatment.  Plan: The patient has completed radiation treatment. The patient will return to radiation oncology clinic for routine followup in one month. I advised the patient to call or return sooner if they have any questions or concerns related to their recovery or treatment. ________________________________  ------------------------------------------------  Jodelle Gross, MD, PhD

## 2020-06-19 IMAGING — CT CT HEAD W/O CM
3 series · 16 of 37 positions shown, 18 images · non-contrast
Comparison: Brain MRI 09/02/2019

CLINICAL DATA: Seizure, nontraumatic. Additional history obtained
from previous radiology examinations:

EXAM:
CT HEAD WITHOUT CONTRAST
TECHNIQUE: Contiguous axial images were obtained from the base of the skull
through the vertex without intravenous contrast.

[Series 3: head without · axial · non-contrast · 0.41mm/px · z∈[-70,+55]mm · 6 of 36 slices shown, 8 images]
[im 6/36  brain]
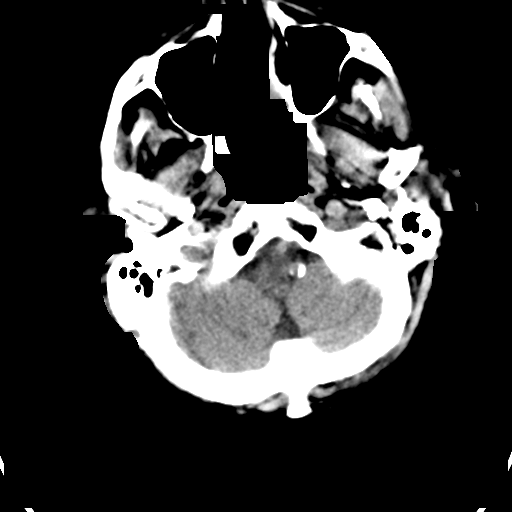
[im 6/36  bone]
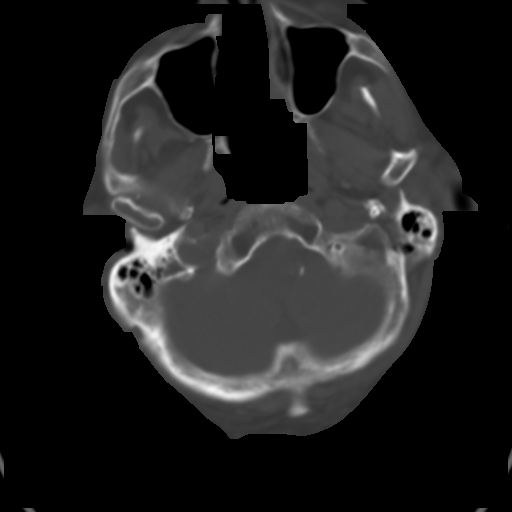
[im 11/36  brain]
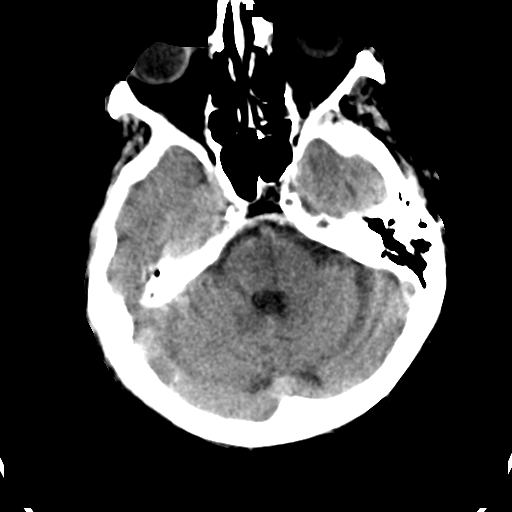
[im 16/36  brain]
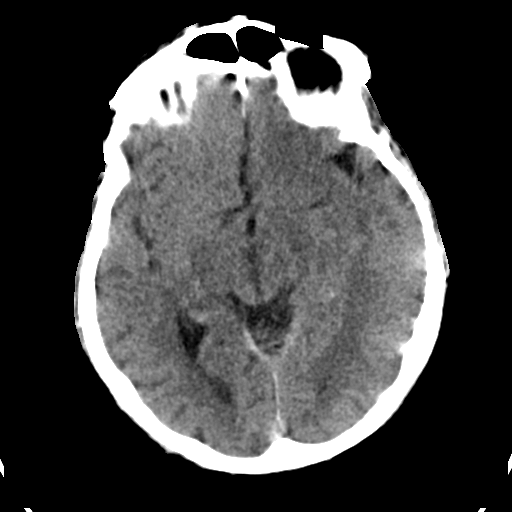
[im 21/36  brain]
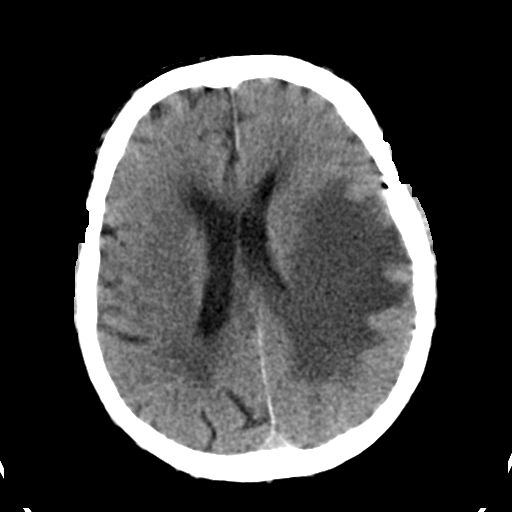
[im 26/36  brain]
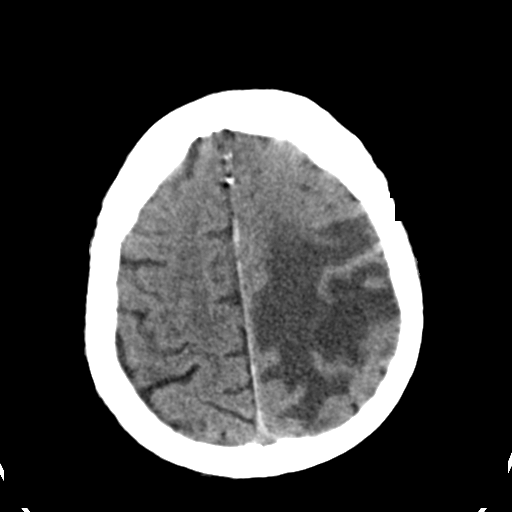
[im 26/36  bone]
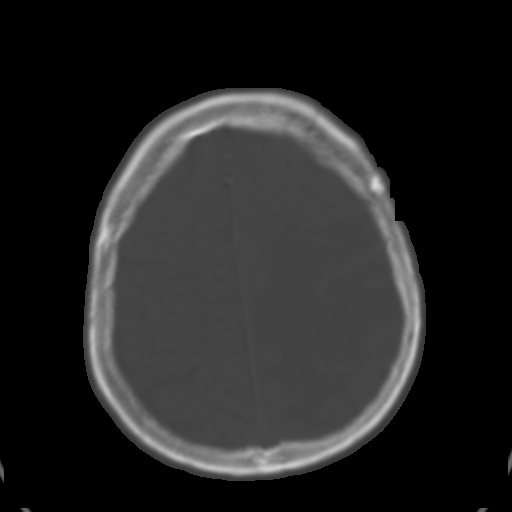
[im 31/36  brain]
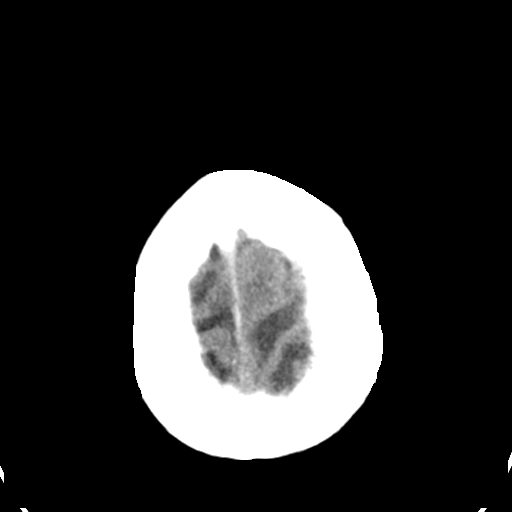

[Series 4: head bone · axial · 0.41mm/px · z∈[-79,+57]mm · 7 of 95 slices shown]
[im 9/95  bone]
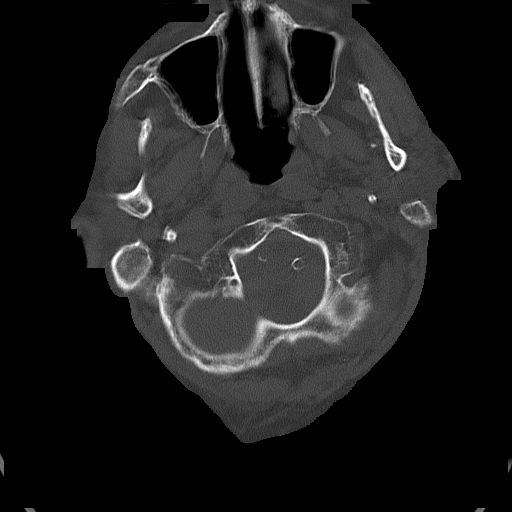
[im 18/95  bone]
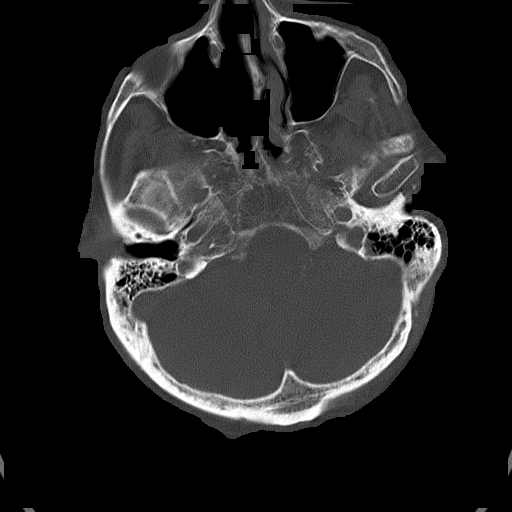
[im 32/95  bone]
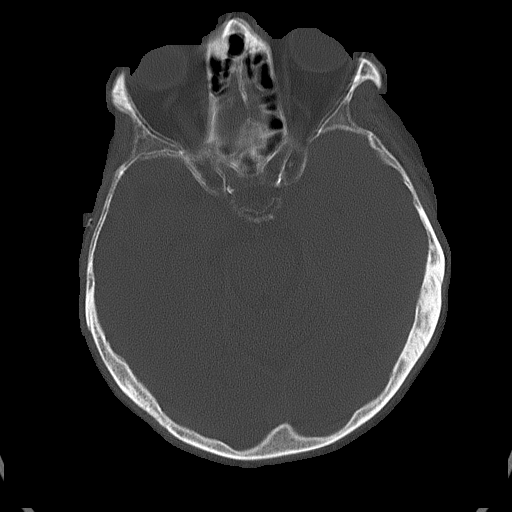
[im 41/95  bone]
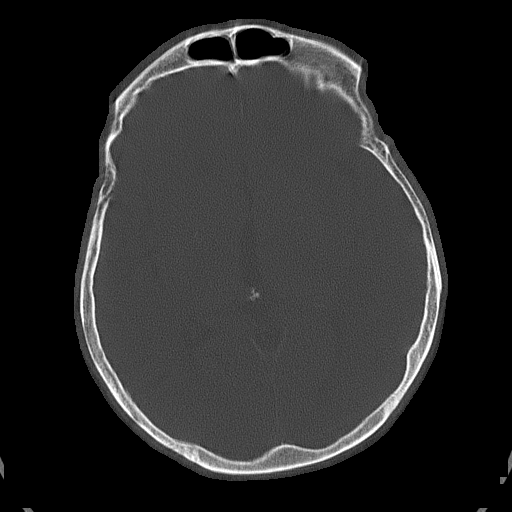
[im 54/95  bone]
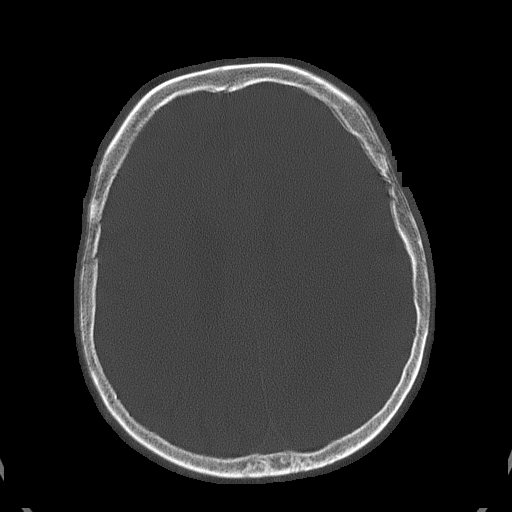
[im 63/95  bone]
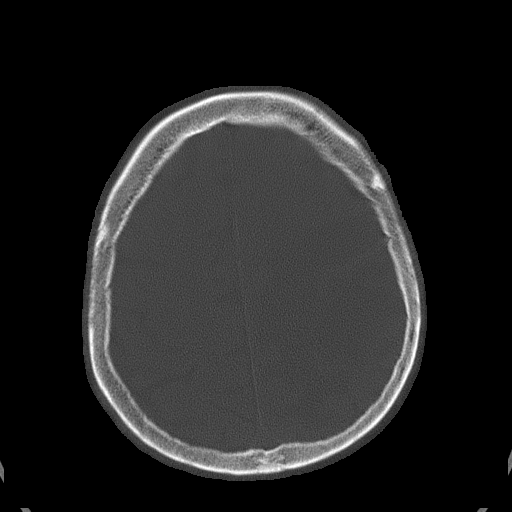
[im 77/95  bone]
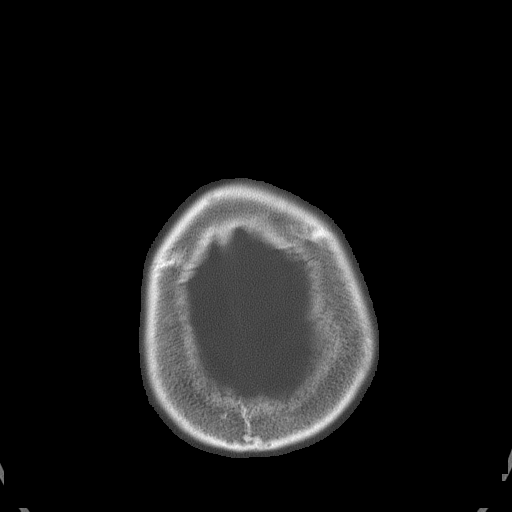

[Series 6: head without sag · sagittal · non-contrast · 0.33mm/px · 3 of 57 slices shown]
[im 19/57  brain]
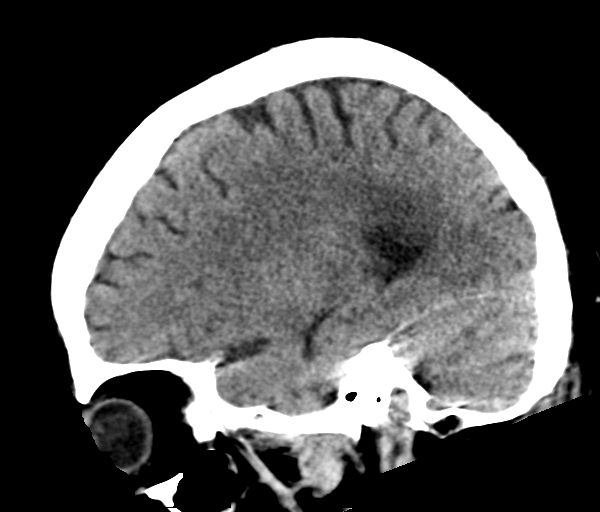
[im 29/57  brain]
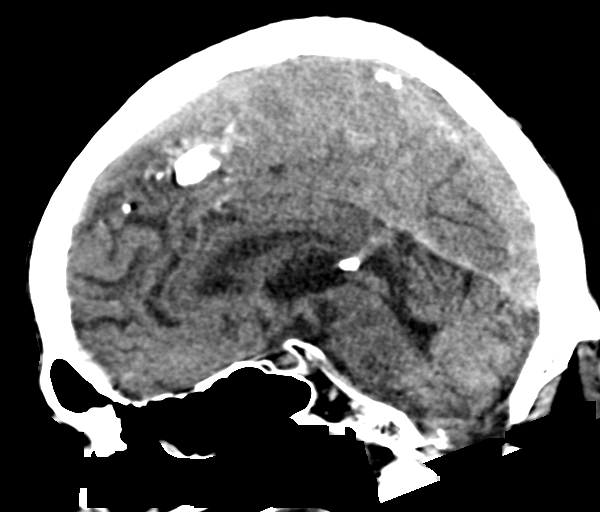
[im 38/57  brain]
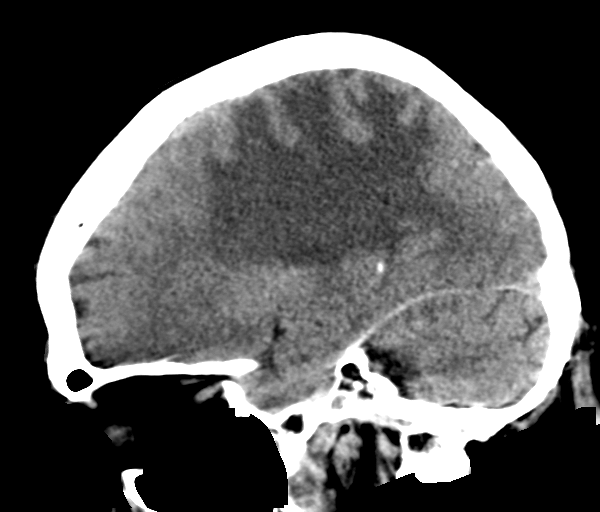

[16 of 37 positions shown; findings below may reference images not displayed]

FINDINGS: Brain:

The examination is mildly motion degraded.

Again demonstrated is extensive vasogenic edema within the left
cerebral hemisphere, predominantly within the left frontal, parietal
and occipital lobes. Multiple intracranial metastases were better
appreciated on recent prior brain MRI 09/02/2019. This includes a
dominant left parietal lobe metastasis previously measured at
cm. There is no definite loss of gray-white differentiation. No
evidence of intracranial hemorrhage. As before, there is mass effect
with partial effacement of the left lateral ventricle. Rightward
midline shift may be minimally increased from prior examination, now
measuring 4 mm (previously 3 mm). No extra-axial fluid collection.
Cerebral volume is normal for age.

Vascular: No hyperdense vessel.  Atherosclerotic calcification.

Skull: Normal. Negative for fracture or focal lesion.

Sinuses/Orbits: Visualized orbits demonstrate no acute abnormality.
Mild ethmoid sinus mucosal thickening. No significant mastoid
effusion.
IMPRESSION: Mildly motion degraded examination.

Extensive vasogenic edema within the left cerebral hemisphere is
similar to prior MRI 07/03/2019. Multiple intracranial metastases
were better appreciated on this prior study, including a dominant
2.2 cm left parietal lobe metastasis. Redemonstrated mass effect
with partial effacement of the left lateral ventricle. 4 mm
rightward midline shift may be minimally increased from the prior
exam.

No acute intracranial hemorrhage. No definite loss of gray-white
differentiation.
